# Patient Record
Sex: Female | Born: 1950
Health system: Southern US, Community
[De-identification: ages and names within clinical notes are randomized; demographics above are authoritative.]

## PROBLEM LIST (undated history)

## (undated) DIAGNOSIS — I10 Essential (primary) hypertension: Secondary | ICD-10-CM

## (undated) DIAGNOSIS — J449 Chronic obstructive pulmonary disease, unspecified: Secondary | ICD-10-CM

## (undated) DIAGNOSIS — F988 Other specified behavioral and emotional disorders with onset usually occurring in childhood and adolescence: Secondary | ICD-10-CM

## (undated) DIAGNOSIS — E039 Hypothyroidism, unspecified: Secondary | ICD-10-CM

## (undated) HISTORY — DX: Chronic obstructive pulmonary disease, unspecified: J44.9

## (undated) HISTORY — PX: OTHER SURGICAL HISTORY: SHX169

## (undated) HISTORY — DX: Hypothyroidism, unspecified: E03.9

## (undated) HISTORY — PX: KNEE ARTHROSCOPY: SUR90

## (undated) HISTORY — PX: ABDOMINAL HYSTERECTOMY: SHX81

## (undated) HISTORY — DX: Essential (primary) hypertension: I10

## (undated) HISTORY — DX: Other specified behavioral and emotional disorders with onset usually occurring in childhood and adolescence: F98.8

---

## 1999-01-09 ENCOUNTER — Inpatient Hospital Stay (HOSPITAL_COMMUNITY)
Admission: EM | Admit: 1999-01-09 | Discharge: 1999-01-12 | Payer: Self-pay | Admitting: Physical Medicine and Rehabilitation

## 1999-01-17 ENCOUNTER — Encounter
Admission: RE | Admit: 1999-01-17 | Discharge: 1999-04-17 | Payer: Self-pay | Admitting: Physical Medicine and Rehabilitation

## 1999-04-18 ENCOUNTER — Encounter: Admission: RE | Admit: 1999-04-18 | Discharge: 1999-05-01 | Payer: Self-pay

## 2000-09-17 ENCOUNTER — Encounter: Admission: RE | Admit: 2000-09-17 | Discharge: 2000-12-16 | Payer: Self-pay | Admitting: Neurology

## 2002-07-14 ENCOUNTER — Encounter: Payer: Self-pay | Admitting: Family Medicine

## 2002-07-14 ENCOUNTER — Ambulatory Visit (HOSPITAL_COMMUNITY): Admission: RE | Admit: 2002-07-14 | Discharge: 2002-07-14 | Payer: Self-pay | Admitting: Family Medicine

## 2003-01-28 ENCOUNTER — Ambulatory Visit (HOSPITAL_COMMUNITY): Admission: RE | Admit: 2003-01-28 | Discharge: 2003-01-28 | Payer: Self-pay | Admitting: Family Medicine

## 2003-01-28 ENCOUNTER — Encounter: Payer: Self-pay | Admitting: Family Medicine

## 2003-04-12 ENCOUNTER — Ambulatory Visit (HOSPITAL_COMMUNITY): Admission: RE | Admit: 2003-04-12 | Discharge: 2003-04-12 | Payer: Self-pay | Admitting: Family Medicine

## 2003-04-12 ENCOUNTER — Encounter: Payer: Self-pay | Admitting: Family Medicine

## 2003-08-09 ENCOUNTER — Encounter: Payer: Self-pay | Admitting: Family Medicine

## 2003-08-09 ENCOUNTER — Ambulatory Visit (HOSPITAL_COMMUNITY): Admission: RE | Admit: 2003-08-09 | Discharge: 2003-08-09 | Payer: Self-pay | Admitting: Family Medicine

## 2004-11-07 ENCOUNTER — Ambulatory Visit (HOSPITAL_COMMUNITY): Admission: RE | Admit: 2004-11-07 | Discharge: 2004-11-07 | Payer: Self-pay | Admitting: Family Medicine

## 2005-10-23 ENCOUNTER — Ambulatory Visit: Payer: Self-pay | Admitting: Orthopedic Surgery

## 2006-08-27 ENCOUNTER — Ambulatory Visit (HOSPITAL_COMMUNITY): Admission: RE | Admit: 2006-08-27 | Discharge: 2006-08-27 | Payer: Self-pay | Admitting: Family Medicine

## 2006-09-29 ENCOUNTER — Ambulatory Visit (HOSPITAL_COMMUNITY): Admission: RE | Admit: 2006-09-29 | Discharge: 2006-09-29 | Payer: Self-pay | Admitting: Family Medicine

## 2007-10-12 ENCOUNTER — Ambulatory Visit (HOSPITAL_COMMUNITY): Admission: RE | Admit: 2007-10-12 | Discharge: 2007-10-12 | Payer: Self-pay | Admitting: Family Medicine

## 2007-11-04 ENCOUNTER — Ambulatory Visit (HOSPITAL_COMMUNITY): Admission: RE | Admit: 2007-11-04 | Discharge: 2007-11-04 | Payer: Self-pay | Admitting: Family Medicine

## 2008-11-08 ENCOUNTER — Ambulatory Visit (HOSPITAL_COMMUNITY): Admission: RE | Admit: 2008-11-08 | Discharge: 2008-11-08 | Payer: Self-pay | Admitting: Family Medicine

## 2009-11-10 ENCOUNTER — Ambulatory Visit (HOSPITAL_COMMUNITY): Admission: RE | Admit: 2009-11-10 | Discharge: 2009-11-10 | Payer: Self-pay | Admitting: Family Medicine

## 2010-06-05 ENCOUNTER — Ambulatory Visit (HOSPITAL_COMMUNITY): Admission: RE | Admit: 2010-06-05 | Discharge: 2010-06-05 | Payer: Self-pay | Admitting: Family Medicine

## 2010-08-01 ENCOUNTER — Ambulatory Visit (HOSPITAL_COMMUNITY): Admission: RE | Admit: 2010-08-01 | Discharge: 2010-08-01 | Payer: Self-pay | Admitting: Family Medicine

## 2010-12-18 ENCOUNTER — Ambulatory Visit (HOSPITAL_COMMUNITY)
Admission: RE | Admit: 2010-12-18 | Discharge: 2010-12-18 | Payer: Self-pay | Source: Home / Self Care | Attending: Family Medicine | Admitting: Family Medicine

## 2010-12-29 ENCOUNTER — Encounter: Payer: Self-pay | Admitting: Family Medicine

## 2010-12-30 ENCOUNTER — Encounter: Payer: Self-pay | Admitting: *Deleted

## 2011-02-18 ENCOUNTER — Emergency Department (HOSPITAL_COMMUNITY)
Admission: EM | Admit: 2011-02-18 | Discharge: 2011-02-18 | Disposition: A | Discharge status: Home / Self Care | Payer: Medicare HMO | Attending: Emergency Medicine | Admitting: Emergency Medicine

## 2011-02-18 DIAGNOSIS — L02419 Cutaneous abscess of limb, unspecified: Secondary | ICD-10-CM | POA: Insufficient documentation

## 2011-02-18 LAB — CBC
HCT: 41.9 % (ref 36.0–46.0)
Hemoglobin: 13.8 g/dL (ref 12.0–15.0)
MCH: 30.5 pg (ref 26.0–34.0)
MCHC: 32.9 g/dL (ref 30.0–36.0)
MCV: 92.5 fL (ref 78.0–100.0)
Platelets: 219 10*3/uL (ref 150–400)
RBC: 4.53 MIL/uL (ref 3.87–5.11)
RDW: 13.5 % (ref 11.5–15.5)
WBC: 9.6 10*3/uL (ref 4.0–10.5)

## 2011-02-18 LAB — BASIC METABOLIC PANEL
BUN: 8 mg/dL (ref 6–23)
CO2: 27 mEq/L (ref 19–32)
Calcium: 9.1 mg/dL (ref 8.4–10.5)
Chloride: 104 mEq/L (ref 96–112)
Creatinine, Ser: 0.72 mg/dL (ref 0.4–1.2)
GFR calc Af Amer: >60 mL/min (ref >60)
GFR calc non Af Amer: >60 mL/min (ref >60)
Glucose, Bld: 97 mg/dL (ref 70–99)
Potassium: 4.2 mEq/L (ref 3.5–5.1)
Sodium: 139 mEq/L (ref 135–145)

## 2011-02-18 LAB — DIFFERENTIAL
Basophils Absolute: 0.1 10*3/uL (ref 0.0–0.1)
Basophils Relative: 1 % (ref 0–1)
Eosinophils Absolute: 0.3 10*3/uL (ref 0.0–0.7)
Eosinophils Relative: 3 % (ref 0–5)
Lymphocytes Relative: 24 % (ref 12–46)
Lymphs Abs: 2.3 10*3/uL (ref 0.7–4.0)
Monocytes Absolute: 0.6 10*3/uL (ref 0.1–1.0)
Monocytes Relative: 6 % (ref 3–12)
Neutro Abs: 6.3 10*3/uL (ref 1.7–7.7)
Neutrophils Relative %: 66 % (ref 43–77)

## 2011-12-23 ENCOUNTER — Other Ambulatory Visit (HOSPITAL_COMMUNITY): Payer: Self-pay | Admitting: Family Medicine

## 2011-12-23 DIAGNOSIS — Z139 Encounter for screening, unspecified: Secondary | ICD-10-CM

## 2011-12-31 ENCOUNTER — Ambulatory Visit (HOSPITAL_COMMUNITY)
Admission: RE | Admit: 2011-12-31 | Discharge: 2011-12-31 | Disposition: A | Discharge status: Home / Self Care | Payer: Medicare HMO | Source: Ambulatory Visit | Attending: Family Medicine | Admitting: Family Medicine

## 2011-12-31 DIAGNOSIS — Z139 Encounter for screening, unspecified: Secondary | ICD-10-CM

## 2011-12-31 DIAGNOSIS — Z1231 Encounter for screening mammogram for malignant neoplasm of breast: Secondary | ICD-10-CM | POA: Insufficient documentation

## 2013-01-25 ENCOUNTER — Other Ambulatory Visit (HOSPITAL_COMMUNITY): Payer: Self-pay | Admitting: Family Medicine

## 2013-01-25 DIAGNOSIS — Z139 Encounter for screening, unspecified: Secondary | ICD-10-CM

## 2013-02-11 ENCOUNTER — Ambulatory Visit (HOSPITAL_COMMUNITY): Payer: Medicare HMO

## 2013-02-12 ENCOUNTER — Ambulatory Visit (HOSPITAL_COMMUNITY): Payer: Medicare HMO

## 2013-05-06 ENCOUNTER — Ambulatory Visit (HOSPITAL_COMMUNITY): Payer: Medicare HMO

## 2013-10-11 ENCOUNTER — Ambulatory Visit (HOSPITAL_COMMUNITY)
Admission: RE | Admit: 2013-10-11 | Discharge: 2013-10-11 | Disposition: A | Discharge status: Home / Self Care | Payer: Medicare Other | Source: Ambulatory Visit | Attending: Family Medicine | Admitting: Family Medicine

## 2013-10-11 DIAGNOSIS — Z1231 Encounter for screening mammogram for malignant neoplasm of breast: Secondary | ICD-10-CM | POA: Insufficient documentation

## 2013-10-11 DIAGNOSIS — Z139 Encounter for screening, unspecified: Secondary | ICD-10-CM

## 2015-05-18 DIAGNOSIS — R0602 Shortness of breath: Secondary | ICD-10-CM | POA: Insufficient documentation

## 2016-08-28 ENCOUNTER — Other Ambulatory Visit (HOSPITAL_COMMUNITY): Payer: Self-pay | Admitting: Family Medicine

## 2016-08-28 ENCOUNTER — Ambulatory Visit (HOSPITAL_COMMUNITY)
Admission: RE | Admit: 2016-08-28 | Discharge: 2016-08-28 | Disposition: A | Discharge status: Home / Self Care | Payer: PPO | Source: Ambulatory Visit | Attending: Family Medicine | Admitting: Family Medicine

## 2016-08-28 DIAGNOSIS — M25532 Pain in left wrist: Secondary | ICD-10-CM | POA: Diagnosis not present

## 2016-08-28 DIAGNOSIS — M25461 Effusion, right knee: Secondary | ICD-10-CM | POA: Diagnosis not present

## 2016-08-28 DIAGNOSIS — Z9889 Other specified postprocedural states: Secondary | ICD-10-CM | POA: Diagnosis not present

## 2016-08-28 DIAGNOSIS — M1711 Unilateral primary osteoarthritis, right knee: Secondary | ICD-10-CM | POA: Diagnosis not present

## 2016-08-28 DIAGNOSIS — M899 Disorder of bone, unspecified: Secondary | ICD-10-CM | POA: Insufficient documentation

## 2016-08-28 DIAGNOSIS — M25561 Pain in right knee: Secondary | ICD-10-CM

## 2016-12-13 DIAGNOSIS — J069 Acute upper respiratory infection, unspecified: Secondary | ICD-10-CM | POA: Diagnosis not present

## 2016-12-13 DIAGNOSIS — Z1389 Encounter for screening for other disorder: Secondary | ICD-10-CM | POA: Diagnosis not present

## 2016-12-13 DIAGNOSIS — J209 Acute bronchitis, unspecified: Secondary | ICD-10-CM | POA: Diagnosis not present

## 2016-12-13 DIAGNOSIS — F909 Attention-deficit hyperactivity disorder, unspecified type: Secondary | ICD-10-CM | POA: Diagnosis not present

## 2016-12-13 DIAGNOSIS — Z6841 Body Mass Index (BMI) 40.0 and over, adult: Secondary | ICD-10-CM | POA: Diagnosis not present

## 2017-02-11 DIAGNOSIS — Z1231 Encounter for screening mammogram for malignant neoplasm of breast: Secondary | ICD-10-CM | POA: Diagnosis not present

## 2017-03-03 DIAGNOSIS — L600 Ingrowing nail: Secondary | ICD-10-CM | POA: Diagnosis not present

## 2017-03-03 DIAGNOSIS — M79671 Pain in right foot: Secondary | ICD-10-CM | POA: Diagnosis not present

## 2017-03-03 DIAGNOSIS — M25579 Pain in unspecified ankle and joints of unspecified foot: Secondary | ICD-10-CM | POA: Diagnosis not present

## 2017-03-03 DIAGNOSIS — I739 Peripheral vascular disease, unspecified: Secondary | ICD-10-CM | POA: Diagnosis not present

## 2017-03-04 ENCOUNTER — Ambulatory Visit (INDEPENDENT_AMBULATORY_CARE_PROVIDER_SITE_OTHER): Payer: Medicare Other | Admitting: Urology

## 2017-03-04 DIAGNOSIS — R392 Extrarenal uremia: Secondary | ICD-10-CM | POA: Diagnosis not present

## 2017-03-04 DIAGNOSIS — N3941 Urge incontinence: Secondary | ICD-10-CM | POA: Diagnosis not present

## 2017-04-15 ENCOUNTER — Ambulatory Visit: Payer: Medicare Other | Admitting: Urology

## 2017-05-20 DIAGNOSIS — F909 Attention-deficit hyperactivity disorder, unspecified type: Secondary | ICD-10-CM | POA: Diagnosis not present

## 2017-05-20 DIAGNOSIS — Z6841 Body Mass Index (BMI) 40.0 and over, adult: Secondary | ICD-10-CM | POA: Diagnosis not present

## 2017-05-26 DIAGNOSIS — I739 Peripheral vascular disease, unspecified: Secondary | ICD-10-CM | POA: Diagnosis not present

## 2017-05-28 DIAGNOSIS — Z1211 Encounter for screening for malignant neoplasm of colon: Secondary | ICD-10-CM | POA: Diagnosis not present

## 2017-07-22 DIAGNOSIS — H401131 Primary open-angle glaucoma, bilateral, mild stage: Secondary | ICD-10-CM | POA: Diagnosis not present

## 2017-08-18 DIAGNOSIS — I739 Peripheral vascular disease, unspecified: Secondary | ICD-10-CM | POA: Diagnosis not present

## 2017-09-15 DIAGNOSIS — B49 Unspecified mycosis: Secondary | ICD-10-CM | POA: Diagnosis not present

## 2017-09-15 DIAGNOSIS — Z23 Encounter for immunization: Secondary | ICD-10-CM | POA: Diagnosis not present

## 2017-09-15 DIAGNOSIS — Z1389 Encounter for screening for other disorder: Secondary | ICD-10-CM | POA: Diagnosis not present

## 2017-09-15 DIAGNOSIS — F909 Attention-deficit hyperactivity disorder, unspecified type: Secondary | ICD-10-CM | POA: Diagnosis not present

## 2017-09-15 DIAGNOSIS — Z6841 Body Mass Index (BMI) 40.0 and over, adult: Secondary | ICD-10-CM | POA: Diagnosis not present

## 2017-11-04 DIAGNOSIS — I739 Peripheral vascular disease, unspecified: Secondary | ICD-10-CM | POA: Diagnosis not present

## 2017-12-08 DIAGNOSIS — Z6841 Body Mass Index (BMI) 40.0 and over, adult: Secondary | ICD-10-CM | POA: Diagnosis not present

## 2017-12-08 DIAGNOSIS — R350 Frequency of micturition: Secondary | ICD-10-CM | POA: Diagnosis not present

## 2017-12-08 DIAGNOSIS — Z1389 Encounter for screening for other disorder: Secondary | ICD-10-CM | POA: Diagnosis not present

## 2017-12-08 DIAGNOSIS — J019 Acute sinusitis, unspecified: Secondary | ICD-10-CM | POA: Diagnosis not present

## 2017-12-08 DIAGNOSIS — R509 Fever, unspecified: Secondary | ICD-10-CM | POA: Diagnosis not present

## 2017-12-08 DIAGNOSIS — M545 Low back pain: Secondary | ICD-10-CM | POA: Diagnosis not present

## 2017-12-08 DIAGNOSIS — R05 Cough: Secondary | ICD-10-CM | POA: Diagnosis not present

## 2017-12-15 ENCOUNTER — Ambulatory Visit (HOSPITAL_COMMUNITY)
Admission: RE | Admit: 2017-12-15 | Discharge: 2017-12-15 | Disposition: A | Discharge status: Home / Self Care | Payer: Medicare Other | Source: Ambulatory Visit | Attending: Family Medicine | Admitting: Family Medicine

## 2017-12-15 ENCOUNTER — Other Ambulatory Visit (HOSPITAL_COMMUNITY): Payer: Self-pay | Admitting: Family Medicine

## 2017-12-15 DIAGNOSIS — J449 Chronic obstructive pulmonary disease, unspecified: Secondary | ICD-10-CM | POA: Diagnosis not present

## 2017-12-15 DIAGNOSIS — E039 Hypothyroidism, unspecified: Secondary | ICD-10-CM | POA: Diagnosis not present

## 2017-12-15 DIAGNOSIS — G40909 Epilepsy, unspecified, not intractable, without status epilepticus: Secondary | ICD-10-CM | POA: Diagnosis not present

## 2017-12-15 DIAGNOSIS — R0989 Other specified symptoms and signs involving the circulatory and respiratory systems: Secondary | ICD-10-CM | POA: Diagnosis not present

## 2017-12-15 DIAGNOSIS — F909 Attention-deficit hyperactivity disorder, unspecified type: Secondary | ICD-10-CM | POA: Diagnosis not present

## 2017-12-15 DIAGNOSIS — R0602 Shortness of breath: Secondary | ICD-10-CM | POA: Diagnosis not present

## 2017-12-15 DIAGNOSIS — E782 Mixed hyperlipidemia: Secondary | ICD-10-CM | POA: Diagnosis not present

## 2017-12-15 DIAGNOSIS — I517 Cardiomegaly: Secondary | ICD-10-CM | POA: Insufficient documentation

## 2017-12-15 DIAGNOSIS — J069 Acute upper respiratory infection, unspecified: Secondary | ICD-10-CM | POA: Insufficient documentation

## 2017-12-15 DIAGNOSIS — Z6841 Body Mass Index (BMI) 40.0 and over, adult: Secondary | ICD-10-CM | POA: Diagnosis not present

## 2017-12-15 DIAGNOSIS — R7309 Other abnormal glucose: Secondary | ICD-10-CM | POA: Diagnosis not present

## 2017-12-15 DIAGNOSIS — R05 Cough: Secondary | ICD-10-CM | POA: Diagnosis not present

## 2017-12-15 DIAGNOSIS — E559 Vitamin D deficiency, unspecified: Secondary | ICD-10-CM | POA: Diagnosis not present

## 2017-12-30 DIAGNOSIS — M1990 Unspecified osteoarthritis, unspecified site: Secondary | ICD-10-CM | POA: Diagnosis not present

## 2017-12-30 DIAGNOSIS — R0602 Shortness of breath: Secondary | ICD-10-CM | POA: Diagnosis not present

## 2017-12-30 DIAGNOSIS — Z87891 Personal history of nicotine dependence: Secondary | ICD-10-CM | POA: Diagnosis not present

## 2017-12-30 DIAGNOSIS — J189 Pneumonia, unspecified organism: Secondary | ICD-10-CM | POA: Diagnosis not present

## 2017-12-30 DIAGNOSIS — E8770 Fluid overload, unspecified: Secondary | ICD-10-CM | POA: Diagnosis not present

## 2017-12-30 DIAGNOSIS — J440 Chronic obstructive pulmonary disease with acute lower respiratory infection: Secondary | ICD-10-CM | POA: Diagnosis present

## 2017-12-30 DIAGNOSIS — E876 Hypokalemia: Secondary | ICD-10-CM | POA: Diagnosis present

## 2017-12-30 DIAGNOSIS — Z6841 Body Mass Index (BMI) 40.0 and over, adult: Secondary | ICD-10-CM | POA: Diagnosis not present

## 2017-12-30 DIAGNOSIS — R918 Other nonspecific abnormal finding of lung field: Secondary | ICD-10-CM | POA: Diagnosis not present

## 2017-12-30 DIAGNOSIS — I471 Supraventricular tachycardia: Secondary | ICD-10-CM | POA: Diagnosis not present

## 2017-12-30 DIAGNOSIS — N390 Urinary tract infection, site not specified: Secondary | ICD-10-CM | POA: Diagnosis present

## 2017-12-30 DIAGNOSIS — M170 Bilateral primary osteoarthritis of knee: Secondary | ICD-10-CM | POA: Diagnosis present

## 2017-12-30 DIAGNOSIS — R091 Pleurisy: Secondary | ICD-10-CM | POA: Diagnosis not present

## 2017-12-30 DIAGNOSIS — A419 Sepsis, unspecified organism: Secondary | ICD-10-CM | POA: Diagnosis not present

## 2017-12-30 DIAGNOSIS — T503X5A Adverse effect of electrolytic, caloric and water-balance agents, initial encounter: Secondary | ICD-10-CM | POA: Diagnosis not present

## 2017-12-30 DIAGNOSIS — F988 Other specified behavioral and emotional disorders with onset usually occurring in childhood and adolescence: Secondary | ICD-10-CM | POA: Diagnosis present

## 2017-12-30 DIAGNOSIS — R000 Tachycardia, unspecified: Secondary | ICD-10-CM | POA: Diagnosis not present

## 2017-12-30 DIAGNOSIS — H409 Unspecified glaucoma: Secondary | ICD-10-CM | POA: Diagnosis not present

## 2017-12-30 DIAGNOSIS — E039 Hypothyroidism, unspecified: Secondary | ICD-10-CM | POA: Diagnosis present

## 2017-12-30 DIAGNOSIS — J168 Pneumonia due to other specified infectious organisms: Secondary | ICD-10-CM | POA: Diagnosis not present

## 2017-12-30 DIAGNOSIS — F900 Attention-deficit hyperactivity disorder, predominantly inattentive type: Secondary | ICD-10-CM | POA: Diagnosis not present

## 2017-12-30 DIAGNOSIS — E872 Acidosis: Secondary | ICD-10-CM | POA: Diagnosis present

## 2017-12-30 DIAGNOSIS — B954 Other streptococcus as the cause of diseases classified elsewhere: Secondary | ICD-10-CM | POA: Diagnosis present

## 2017-12-30 DIAGNOSIS — J9601 Acute respiratory failure with hypoxia: Secondary | ICD-10-CM | POA: Diagnosis present

## 2017-12-30 DIAGNOSIS — N3281 Overactive bladder: Secondary | ICD-10-CM | POA: Diagnosis not present

## 2017-12-30 DIAGNOSIS — I4892 Unspecified atrial flutter: Secondary | ICD-10-CM | POA: Diagnosis present

## 2017-12-30 DIAGNOSIS — J441 Chronic obstructive pulmonary disease with (acute) exacerbation: Secondary | ICD-10-CM | POA: Diagnosis not present

## 2017-12-30 DIAGNOSIS — J90 Pleural effusion, not elsewhere classified: Secondary | ICD-10-CM | POA: Diagnosis not present

## 2017-12-30 DIAGNOSIS — J13 Pneumonia due to Streptococcus pneumoniae: Secondary | ICD-10-CM | POA: Diagnosis present

## 2017-12-30 DIAGNOSIS — R41841 Cognitive communication deficit: Secondary | ICD-10-CM | POA: Diagnosis not present

## 2017-12-30 DIAGNOSIS — E6609 Other obesity due to excess calories: Secondary | ICD-10-CM | POA: Diagnosis present

## 2017-12-30 DIAGNOSIS — A403 Sepsis due to Streptococcus pneumoniae: Secondary | ICD-10-CM | POA: Diagnosis not present

## 2017-12-30 DIAGNOSIS — Z79899 Other long term (current) drug therapy: Secondary | ICD-10-CM | POA: Diagnosis not present

## 2017-12-30 DIAGNOSIS — M6281 Muscle weakness (generalized): Secondary | ICD-10-CM | POA: Diagnosis not present

## 2017-12-30 DIAGNOSIS — I4891 Unspecified atrial fibrillation: Secondary | ICD-10-CM | POA: Diagnosis present

## 2018-01-05 ENCOUNTER — Encounter: Payer: Self-pay | Admitting: Cardiovascular Disease

## 2018-01-07 DIAGNOSIS — E876 Hypokalemia: Secondary | ICD-10-CM | POA: Diagnosis not present

## 2018-01-07 DIAGNOSIS — J168 Pneumonia due to other specified infectious organisms: Secondary | ICD-10-CM | POA: Diagnosis not present

## 2018-01-07 DIAGNOSIS — I4891 Unspecified atrial fibrillation: Secondary | ICD-10-CM | POA: Diagnosis not present

## 2018-01-07 DIAGNOSIS — J441 Chronic obstructive pulmonary disease with (acute) exacerbation: Secondary | ICD-10-CM | POA: Diagnosis not present

## 2018-01-07 DIAGNOSIS — I471 Supraventricular tachycardia: Secondary | ICD-10-CM | POA: Diagnosis not present

## 2018-01-07 DIAGNOSIS — J9601 Acute respiratory failure with hypoxia: Secondary | ICD-10-CM | POA: Diagnosis not present

## 2018-01-07 DIAGNOSIS — M1990 Unspecified osteoarthritis, unspecified site: Secondary | ICD-10-CM | POA: Diagnosis not present

## 2018-01-07 DIAGNOSIS — J440 Chronic obstructive pulmonary disease with acute lower respiratory infection: Secondary | ICD-10-CM | POA: Diagnosis not present

## 2018-01-07 DIAGNOSIS — F988 Other specified behavioral and emotional disorders with onset usually occurring in childhood and adolescence: Secondary | ICD-10-CM | POA: Diagnosis not present

## 2018-01-07 DIAGNOSIS — J189 Pneumonia, unspecified organism: Secondary | ICD-10-CM | POA: Diagnosis not present

## 2018-01-07 DIAGNOSIS — R41841 Cognitive communication deficit: Secondary | ICD-10-CM | POA: Diagnosis not present

## 2018-01-07 DIAGNOSIS — N3281 Overactive bladder: Secondary | ICD-10-CM | POA: Diagnosis not present

## 2018-01-07 DIAGNOSIS — H409 Unspecified glaucoma: Secondary | ICD-10-CM | POA: Diagnosis not present

## 2018-01-07 DIAGNOSIS — E039 Hypothyroidism, unspecified: Secondary | ICD-10-CM | POA: Diagnosis not present

## 2018-01-07 DIAGNOSIS — A419 Sepsis, unspecified organism: Secondary | ICD-10-CM | POA: Diagnosis not present

## 2018-01-07 DIAGNOSIS — M6281 Muscle weakness (generalized): Secondary | ICD-10-CM | POA: Diagnosis not present

## 2018-01-25 DIAGNOSIS — J449 Chronic obstructive pulmonary disease, unspecified: Secondary | ICD-10-CM | POA: Diagnosis not present

## 2018-01-25 DIAGNOSIS — I471 Supraventricular tachycardia: Secondary | ICD-10-CM | POA: Diagnosis not present

## 2018-01-25 DIAGNOSIS — Z8701 Personal history of pneumonia (recurrent): Secondary | ICD-10-CM | POA: Diagnosis not present

## 2018-01-25 DIAGNOSIS — Z87891 Personal history of nicotine dependence: Secondary | ICD-10-CM | POA: Diagnosis not present

## 2018-01-25 DIAGNOSIS — M1712 Unilateral primary osteoarthritis, left knee: Secondary | ICD-10-CM | POA: Diagnosis not present

## 2018-01-25 DIAGNOSIS — M1711 Unilateral primary osteoarthritis, right knee: Secondary | ICD-10-CM | POA: Diagnosis not present

## 2018-01-26 DIAGNOSIS — J449 Chronic obstructive pulmonary disease, unspecified: Secondary | ICD-10-CM | POA: Diagnosis not present

## 2018-01-26 DIAGNOSIS — Z8701 Personal history of pneumonia (recurrent): Secondary | ICD-10-CM | POA: Diagnosis not present

## 2018-01-26 DIAGNOSIS — Z87891 Personal history of nicotine dependence: Secondary | ICD-10-CM | POA: Diagnosis not present

## 2018-01-26 DIAGNOSIS — M1711 Unilateral primary osteoarthritis, right knee: Secondary | ICD-10-CM | POA: Diagnosis not present

## 2018-01-26 DIAGNOSIS — M1712 Unilateral primary osteoarthritis, left knee: Secondary | ICD-10-CM | POA: Diagnosis not present

## 2018-01-26 DIAGNOSIS — I471 Supraventricular tachycardia: Secondary | ICD-10-CM | POA: Diagnosis not present

## 2018-01-27 DIAGNOSIS — I739 Peripheral vascular disease, unspecified: Secondary | ICD-10-CM | POA: Diagnosis not present

## 2018-01-28 DIAGNOSIS — Z87891 Personal history of nicotine dependence: Secondary | ICD-10-CM | POA: Diagnosis not present

## 2018-01-28 DIAGNOSIS — M1711 Unilateral primary osteoarthritis, right knee: Secondary | ICD-10-CM | POA: Diagnosis not present

## 2018-01-28 DIAGNOSIS — I471 Supraventricular tachycardia: Secondary | ICD-10-CM | POA: Diagnosis not present

## 2018-01-28 DIAGNOSIS — Z8701 Personal history of pneumonia (recurrent): Secondary | ICD-10-CM | POA: Diagnosis not present

## 2018-01-28 DIAGNOSIS — J449 Chronic obstructive pulmonary disease, unspecified: Secondary | ICD-10-CM | POA: Diagnosis not present

## 2018-01-28 DIAGNOSIS — M1712 Unilateral primary osteoarthritis, left knee: Secondary | ICD-10-CM | POA: Diagnosis not present

## 2018-01-30 DIAGNOSIS — Z6841 Body Mass Index (BMI) 40.0 and over, adult: Secondary | ICD-10-CM | POA: Diagnosis not present

## 2018-01-30 DIAGNOSIS — A419 Sepsis, unspecified organism: Secondary | ICD-10-CM | POA: Diagnosis not present

## 2018-01-30 DIAGNOSIS — J189 Pneumonia, unspecified organism: Secondary | ICD-10-CM | POA: Diagnosis not present

## 2018-02-02 DIAGNOSIS — Z87891 Personal history of nicotine dependence: Secondary | ICD-10-CM | POA: Diagnosis not present

## 2018-02-02 DIAGNOSIS — I471 Supraventricular tachycardia: Secondary | ICD-10-CM | POA: Diagnosis not present

## 2018-02-02 DIAGNOSIS — Z8701 Personal history of pneumonia (recurrent): Secondary | ICD-10-CM | POA: Diagnosis not present

## 2018-02-02 DIAGNOSIS — M1711 Unilateral primary osteoarthritis, right knee: Secondary | ICD-10-CM | POA: Diagnosis not present

## 2018-02-02 DIAGNOSIS — M1712 Unilateral primary osteoarthritis, left knee: Secondary | ICD-10-CM | POA: Diagnosis not present

## 2018-02-02 DIAGNOSIS — J449 Chronic obstructive pulmonary disease, unspecified: Secondary | ICD-10-CM | POA: Diagnosis not present

## 2018-02-03 DIAGNOSIS — J449 Chronic obstructive pulmonary disease, unspecified: Secondary | ICD-10-CM | POA: Diagnosis not present

## 2018-02-03 DIAGNOSIS — M1712 Unilateral primary osteoarthritis, left knee: Secondary | ICD-10-CM | POA: Diagnosis not present

## 2018-02-03 DIAGNOSIS — M1711 Unilateral primary osteoarthritis, right knee: Secondary | ICD-10-CM | POA: Diagnosis not present

## 2018-02-03 DIAGNOSIS — Z8701 Personal history of pneumonia (recurrent): Secondary | ICD-10-CM | POA: Diagnosis not present

## 2018-02-03 DIAGNOSIS — I471 Supraventricular tachycardia: Secondary | ICD-10-CM | POA: Diagnosis not present

## 2018-02-03 DIAGNOSIS — Z87891 Personal history of nicotine dependence: Secondary | ICD-10-CM | POA: Diagnosis not present

## 2018-02-04 DIAGNOSIS — I471 Supraventricular tachycardia: Secondary | ICD-10-CM | POA: Diagnosis not present

## 2018-02-04 DIAGNOSIS — J449 Chronic obstructive pulmonary disease, unspecified: Secondary | ICD-10-CM | POA: Diagnosis not present

## 2018-02-04 DIAGNOSIS — M1711 Unilateral primary osteoarthritis, right knee: Secondary | ICD-10-CM | POA: Diagnosis not present

## 2018-02-04 DIAGNOSIS — Z8701 Personal history of pneumonia (recurrent): Secondary | ICD-10-CM | POA: Diagnosis not present

## 2018-02-04 DIAGNOSIS — M1712 Unilateral primary osteoarthritis, left knee: Secondary | ICD-10-CM | POA: Diagnosis not present

## 2018-02-04 DIAGNOSIS — Z87891 Personal history of nicotine dependence: Secondary | ICD-10-CM | POA: Diagnosis not present

## 2018-02-06 DIAGNOSIS — M1712 Unilateral primary osteoarthritis, left knee: Secondary | ICD-10-CM | POA: Diagnosis not present

## 2018-02-06 DIAGNOSIS — J189 Pneumonia, unspecified organism: Secondary | ICD-10-CM | POA: Diagnosis not present

## 2018-02-06 DIAGNOSIS — I471 Supraventricular tachycardia: Secondary | ICD-10-CM | POA: Diagnosis not present

## 2018-02-06 DIAGNOSIS — Z8701 Personal history of pneumonia (recurrent): Secondary | ICD-10-CM | POA: Diagnosis not present

## 2018-02-06 DIAGNOSIS — Z87891 Personal history of nicotine dependence: Secondary | ICD-10-CM | POA: Diagnosis not present

## 2018-02-06 DIAGNOSIS — J449 Chronic obstructive pulmonary disease, unspecified: Secondary | ICD-10-CM | POA: Diagnosis not present

## 2018-02-06 DIAGNOSIS — A419 Sepsis, unspecified organism: Secondary | ICD-10-CM | POA: Diagnosis not present

## 2018-02-06 DIAGNOSIS — M1711 Unilateral primary osteoarthritis, right knee: Secondary | ICD-10-CM | POA: Diagnosis not present

## 2018-02-06 DIAGNOSIS — Z6841 Body Mass Index (BMI) 40.0 and over, adult: Secondary | ICD-10-CM | POA: Diagnosis not present

## 2018-02-09 DIAGNOSIS — M1711 Unilateral primary osteoarthritis, right knee: Secondary | ICD-10-CM | POA: Diagnosis not present

## 2018-02-09 DIAGNOSIS — I471 Supraventricular tachycardia: Secondary | ICD-10-CM | POA: Diagnosis not present

## 2018-02-09 DIAGNOSIS — Z87891 Personal history of nicotine dependence: Secondary | ICD-10-CM | POA: Diagnosis not present

## 2018-02-09 DIAGNOSIS — M1712 Unilateral primary osteoarthritis, left knee: Secondary | ICD-10-CM | POA: Diagnosis not present

## 2018-02-09 DIAGNOSIS — Z8701 Personal history of pneumonia (recurrent): Secondary | ICD-10-CM | POA: Diagnosis not present

## 2018-02-09 DIAGNOSIS — J449 Chronic obstructive pulmonary disease, unspecified: Secondary | ICD-10-CM | POA: Diagnosis not present

## 2018-02-10 DIAGNOSIS — M1712 Unilateral primary osteoarthritis, left knee: Secondary | ICD-10-CM | POA: Diagnosis not present

## 2018-02-10 DIAGNOSIS — I471 Supraventricular tachycardia: Secondary | ICD-10-CM | POA: Diagnosis not present

## 2018-02-10 DIAGNOSIS — M1711 Unilateral primary osteoarthritis, right knee: Secondary | ICD-10-CM | POA: Diagnosis not present

## 2018-02-10 DIAGNOSIS — Z8701 Personal history of pneumonia (recurrent): Secondary | ICD-10-CM | POA: Diagnosis not present

## 2018-02-10 DIAGNOSIS — J449 Chronic obstructive pulmonary disease, unspecified: Secondary | ICD-10-CM | POA: Diagnosis not present

## 2018-02-10 DIAGNOSIS — Z87891 Personal history of nicotine dependence: Secondary | ICD-10-CM | POA: Diagnosis not present

## 2018-02-11 DIAGNOSIS — R0600 Dyspnea, unspecified: Secondary | ICD-10-CM | POA: Diagnosis not present

## 2018-02-11 DIAGNOSIS — Z6841 Body Mass Index (BMI) 40.0 and over, adult: Secondary | ICD-10-CM | POA: Diagnosis not present

## 2018-02-11 DIAGNOSIS — I4892 Unspecified atrial flutter: Secondary | ICD-10-CM | POA: Diagnosis not present

## 2018-02-11 DIAGNOSIS — I10 Essential (primary) hypertension: Secondary | ICD-10-CM | POA: Diagnosis not present

## 2018-02-12 ENCOUNTER — Encounter: Payer: Self-pay | Admitting: Cardiovascular Disease

## 2018-02-12 DIAGNOSIS — Z823 Family history of stroke: Secondary | ICD-10-CM | POA: Diagnosis not present

## 2018-02-12 DIAGNOSIS — F909 Attention-deficit hyperactivity disorder, unspecified type: Secondary | ICD-10-CM | POA: Diagnosis not present

## 2018-02-12 DIAGNOSIS — Z8249 Family history of ischemic heart disease and other diseases of the circulatory system: Secondary | ICD-10-CM | POA: Diagnosis not present

## 2018-02-12 DIAGNOSIS — I4891 Unspecified atrial fibrillation: Secondary | ICD-10-CM | POA: Diagnosis not present

## 2018-02-12 DIAGNOSIS — M170 Bilateral primary osteoarthritis of knee: Secondary | ICD-10-CM | POA: Diagnosis not present

## 2018-02-12 DIAGNOSIS — Z87891 Personal history of nicotine dependence: Secondary | ICD-10-CM | POA: Diagnosis not present

## 2018-02-12 DIAGNOSIS — E039 Hypothyroidism, unspecified: Secondary | ICD-10-CM | POA: Diagnosis not present

## 2018-02-12 DIAGNOSIS — Z811 Family history of alcohol abuse and dependence: Secondary | ICD-10-CM | POA: Diagnosis not present

## 2018-02-12 DIAGNOSIS — J449 Chronic obstructive pulmonary disease, unspecified: Secondary | ICD-10-CM | POA: Diagnosis not present

## 2018-02-12 DIAGNOSIS — M1712 Unilateral primary osteoarthritis, left knee: Secondary | ICD-10-CM | POA: Diagnosis not present

## 2018-02-12 DIAGNOSIS — Z7982 Long term (current) use of aspirin: Secondary | ICD-10-CM | POA: Diagnosis not present

## 2018-02-12 DIAGNOSIS — Z8701 Personal history of pneumonia (recurrent): Secondary | ICD-10-CM | POA: Diagnosis not present

## 2018-02-12 DIAGNOSIS — I471 Supraventricular tachycardia: Secondary | ICD-10-CM | POA: Diagnosis not present

## 2018-02-12 DIAGNOSIS — Z809 Family history of malignant neoplasm, unspecified: Secondary | ICD-10-CM | POA: Diagnosis not present

## 2018-02-12 DIAGNOSIS — R0602 Shortness of breath: Secondary | ICD-10-CM | POA: Diagnosis not present

## 2018-02-12 DIAGNOSIS — M1711 Unilateral primary osteoarthritis, right knee: Secondary | ICD-10-CM | POA: Diagnosis not present

## 2018-02-12 DIAGNOSIS — Z90710 Acquired absence of both cervix and uterus: Secondary | ICD-10-CM | POA: Diagnosis not present

## 2018-02-12 DIAGNOSIS — Z79899 Other long term (current) drug therapy: Secondary | ICD-10-CM | POA: Diagnosis not present

## 2018-02-12 DIAGNOSIS — R000 Tachycardia, unspecified: Secondary | ICD-10-CM | POA: Diagnosis not present

## 2018-02-12 DIAGNOSIS — I4892 Unspecified atrial flutter: Secondary | ICD-10-CM | POA: Diagnosis not present

## 2018-02-12 DIAGNOSIS — R002 Palpitations: Secondary | ICD-10-CM | POA: Diagnosis not present

## 2018-02-12 DIAGNOSIS — Z833 Family history of diabetes mellitus: Secondary | ICD-10-CM | POA: Diagnosis not present

## 2018-02-13 DIAGNOSIS — I4892 Unspecified atrial flutter: Secondary | ICD-10-CM | POA: Diagnosis not present

## 2018-02-14 DIAGNOSIS — Z87891 Personal history of nicotine dependence: Secondary | ICD-10-CM | POA: Diagnosis not present

## 2018-02-14 DIAGNOSIS — M1712 Unilateral primary osteoarthritis, left knee: Secondary | ICD-10-CM | POA: Diagnosis not present

## 2018-02-14 DIAGNOSIS — J449 Chronic obstructive pulmonary disease, unspecified: Secondary | ICD-10-CM | POA: Diagnosis not present

## 2018-02-14 DIAGNOSIS — Z8701 Personal history of pneumonia (recurrent): Secondary | ICD-10-CM | POA: Diagnosis not present

## 2018-02-14 DIAGNOSIS — I471 Supraventricular tachycardia: Secondary | ICD-10-CM | POA: Diagnosis not present

## 2018-02-14 DIAGNOSIS — M1711 Unilateral primary osteoarthritis, right knee: Secondary | ICD-10-CM | POA: Diagnosis not present

## 2018-02-16 ENCOUNTER — Encounter: Payer: Self-pay | Admitting: *Deleted

## 2018-02-16 DIAGNOSIS — I471 Supraventricular tachycardia: Secondary | ICD-10-CM | POA: Diagnosis not present

## 2018-02-16 DIAGNOSIS — Z87891 Personal history of nicotine dependence: Secondary | ICD-10-CM | POA: Diagnosis not present

## 2018-02-16 DIAGNOSIS — M1712 Unilateral primary osteoarthritis, left knee: Secondary | ICD-10-CM | POA: Diagnosis not present

## 2018-02-16 DIAGNOSIS — Z8701 Personal history of pneumonia (recurrent): Secondary | ICD-10-CM | POA: Diagnosis not present

## 2018-02-16 DIAGNOSIS — M1711 Unilateral primary osteoarthritis, right knee: Secondary | ICD-10-CM | POA: Diagnosis not present

## 2018-02-16 DIAGNOSIS — J449 Chronic obstructive pulmonary disease, unspecified: Secondary | ICD-10-CM | POA: Diagnosis not present

## 2018-02-17 ENCOUNTER — Encounter: Payer: Self-pay | Admitting: Cardiovascular Disease

## 2018-02-17 ENCOUNTER — Ambulatory Visit (INDEPENDENT_AMBULATORY_CARE_PROVIDER_SITE_OTHER): Payer: Medicare Other | Admitting: Cardiovascular Disease

## 2018-02-17 ENCOUNTER — Encounter: Payer: Self-pay | Admitting: *Deleted

## 2018-02-17 VITALS — BP 122/84 | HR 66 | Ht 67.0 in | Wt 272.0 lb

## 2018-02-17 DIAGNOSIS — Z7189 Other specified counseling: Secondary | ICD-10-CM | POA: Diagnosis not present

## 2018-02-17 DIAGNOSIS — I10 Essential (primary) hypertension: Secondary | ICD-10-CM

## 2018-02-17 DIAGNOSIS — J449 Chronic obstructive pulmonary disease, unspecified: Secondary | ICD-10-CM

## 2018-02-17 DIAGNOSIS — E039 Hypothyroidism, unspecified: Secondary | ICD-10-CM

## 2018-02-17 DIAGNOSIS — I4892 Unspecified atrial flutter: Secondary | ICD-10-CM | POA: Diagnosis not present

## 2018-02-17 MED ORDER — APIXABAN 5 MG PO TABS: 5.0000 mg | ORAL_TABLET | Freq: Two times a day (BID) | ORAL | 6 refills | Status: DC

## 2018-02-17 MED ORDER — APIXABAN 5 MG PO TABS: 5.0000 mg | ORAL_TABLET | Freq: Two times a day (BID) | ORAL | 0 refills | Status: DC

## 2018-02-17 NOTE — Patient Instructions (Signed)
Medication Instructions:   Stop Aspirin.  Begin Eliquis 5mg  twice a day.    Continue all other medications.    Labwork: none  Testing/Procedures: none  Follow-Up: 3 months   Any Other Special Instructions Will Be Listed Below (If Applicable). Enroll in anticoagulation clinic for new management of Eliquis - 1 month.    If you need a refill on your cardiac medications before your next appointment, please call your pharmacy.

## 2018-02-17 NOTE — Progress Notes (Signed)
CARDIOLOGY CONSULT NOTE  Patient ID: Autumn Parrish MRN: [id number] DOB/AGE: 67-Nov-1952 67 y.o.  Admit date: (Not on file) Primary Physician: Sharilyn Sites, MD Referring Physician: Dr. Rowe Pavy  Reason for Consultation: Rapid atrial flutter  HPI: Autumn Parrish is a 67 y.o. female who is being seen today for the evaluation of rapid atrial flutter at the request of Sherril Cong, MD.   The patient was recently hospitalized at [facility].  I reviewed extensive documentation, labs, and studies.  She reportedly has a history of both rapid atrial fibrillation and rapid atrial flutter.  An echocardiogram performed in January 2019 reportedly demonstrated normal left ventricular systolic function, LVEF 71%.  She was discharged on long-acting diltiazem 240 mg daily along with metoprolol 25 mg twice daily.    Relevant labs performed on 02/12/18: BUN 25, N-terminal proBNP 225, calcium 9.1, creatinine 0.85, hemoglobin 14.7, TSH 3.36, normal troponin, white blood cells 8.8, urine tox screen positive for amphetamines.  Additional past medical history includes COPD, hypothyroidism, and a history of tobacco abuse.  Chest x-ray showed stable cardiomegaly with minimal aortic atherosclerosis and slight interval increase in vascular congestion on 02/12/18.  I personally reviewed an ECG performed on 02/12/18 at 1724 which demonstrated rapid atrial flutter, heart rate 121 bpm.  There appeared to be variable conduction.  She denies a history of MI.  I will have to request a copy of the echocardiogram report.  She told me she had pneumonia back in January.  She currently denies chest pain, palpitations, and shortness of breath.  She quit smoking 5 years ago.  She said she has chronic lower extremity edema.  She walks with a cane and has bilateral knee arthritis and previously underwent surgery on her right knee about 35 years ago.   Allergies  Allergen Reactions  . Nsaids     Upset stomach can tolerate  ibuprofen     Current Outpatient Medications  Medication Sig Dispense Refill  . amphetamine-dextroamphetamine (ADDERALL) 30 MG tablet Take 30 mg by mouth 2 (two) times daily.    Marland Kitchen aspirin EC 81 MG tablet Take 81 mg by mouth daily.    Marland Kitchen diltiazem (CARDIZEM CD) 240 MG 24 hr capsule Take 240 mg by mouth daily.    . ergocalciferol (VITAMIN D2) 50000 units capsule Take 50,000 Units by mouth once a week.    Marland Kitchen ibuprofen (ADVIL,MOTRIN) 800 MG tablet Take 800 mg by mouth every 8 (eight) hours as needed.    Marland Kitchen levothyroxine (SYNTHROID, LEVOTHROID) 50 MCG tablet Take 50 mcg by mouth daily before breakfast.    . metoprolol tartrate (LOPRESSOR) 25 MG tablet Take 25 mg by mouth 2 (two) times daily.    . Multiple Vitamins-Minerals (CENTRUM ADULTS PO) Take by mouth.    . oxybutynin (DITROPAN XL) 15 MG 24 hr tablet Take 15 mg by mouth at bedtime.    . timolol (BETIMOL) 0.5 % ophthalmic solution Place 1 drop into both eyes 2 (two) times daily.     No current facility-administered medications for this visit.     History reviewed. No pertinent past medical history.  History reviewed. No pertinent surgical history.  Social History   Socioeconomic History  . Marital status: Divorced    Spouse name: Not on file  . Number of children: Not on file  . Years of education: Not on file  . Highest education level: Not on file  Social Needs  . Financial resource strain: Not on file  .  Food insecurity - worry: Not on file  . Food insecurity - inability: Not on file  . Transportation needs - medical: Not on file  . Transportation needs - non-medical: Not on file  Occupational History  . Not on file  Tobacco Use  . Smoking status: Former Smoker    Types: Cigarettes    Last attempt to quit: 02/17/2013    Years since quitting: 5.0  . Smokeless tobacco: Never Used  Substance and Sexual Activity  . Alcohol use: Not on file  . Drug use: Not on file  . Sexual activity: Not on file  Other Topics Concern  .  Not on file  Social History Narrative  . Not on file     No family history of premature CAD in 1st degree relatives.  Current Meds  Medication Sig  . amphetamine-dextroamphetamine (ADDERALL) 30 MG tablet Take 30 mg by mouth 2 (two) times daily.  Marland Kitchen aspirin EC 81 MG tablet Take 81 mg by mouth daily.  Marland Kitchen diltiazem (CARDIZEM CD) 240 MG 24 hr capsule Take 240 mg by mouth daily.  . ergocalciferol (VITAMIN D2) 50000 units capsule Take 50,000 Units by mouth once a week.  Marland Kitchen ibuprofen (ADVIL,MOTRIN) 800 MG tablet Take 800 mg by mouth every 8 (eight) hours as needed.  Marland Kitchen levothyroxine (SYNTHROID, LEVOTHROID) 50 MCG tablet Take 50 mcg by mouth daily before breakfast.  . metoprolol tartrate (LOPRESSOR) 25 MG tablet Take 25 mg by mouth 2 (two) times daily.  . Multiple Vitamins-Minerals (CENTRUM ADULTS PO) Take by mouth.  . oxybutynin (DITROPAN XL) 15 MG 24 hr tablet Take 15 mg by mouth at bedtime.  . timolol (BETIMOL) 0.5 % ophthalmic solution Place 1 drop into both eyes 2 (two) times daily.      Review of systems complete and found to be negative unless listed above in HPI    Physical exam Blood pressure 122/84, pulse 66, height 5\' 7"  (1.702 m), weight 272 lb (123.4 kg), SpO2 98 %. General: NAD Neck: No JVD, no thyromegaly or thyroid nodule.  Lungs: Clear to auscultation bilaterally with normal respiratory effort. CV: Nondisplaced PMI. Regular rate and rhythm, normal S1/S2, no S3/S4, no murmur.  Trivial bilateral lower extremity edema.  No carotid bruit.    Abdomen: Soft, nontender, no distention.  Skin: Intact without lesions or rashes.  Neurologic: Alert and oriented x 3.  Psych: Normal affect. Extremities: No clubbing or cyanosis.  HEENT: Normal.   ECG: Most recent ECG reviewed.   Labs: Lab Results  Component Value Date/Time   K 4.2 02/18/2011 11:24 AM   BUN 8 02/18/2011 11:24 AM   CREATININE 0.72 02/18/2011 11:24 AM   HGB 13.8 02/18/2011 11:24 AM     Lipids: No results found  for: LDLCALC, LDLDIRECT, CHOL, TRIG, HDL      ASSESSMENT AND PLAN:   1.  Rapid atrial flutter: Symptomatically stable.  She is in a regular rhythm at present.  Heart rate is presently controlled on long-acting diltiazem 240 mg daily and metoprolol tartrate 25 mg twice daily.  She is only taking aspirin 81 mg daily. CHA2DS2VASC score is at least 3 (hypertension, age, gender).  Thus, systemic anticoagulation is indicated to reduce thrombolic risk.  I will stop aspirin and start Eliquis 5 mg twice daily.  I gave her a detailed explanation of the rationale for this and she is in agreement.  2.  Hypertension: Controlled on present therapy.  No changes.   3.  COPD: Stable.  She quit smoking  5 years ago.  4.  Hypothyroidism: TSH is normal as reviewed above.  She is on levothyroxine 50 mcg daily.    Disposition: Follow up in 3 months  Signed: Kate Sable, M.D., F.A.C.C.  02/17/2018, 8:36 AM

## 2018-02-18 DIAGNOSIS — M1711 Unilateral primary osteoarthritis, right knee: Secondary | ICD-10-CM | POA: Diagnosis not present

## 2018-02-18 DIAGNOSIS — Z6841 Body Mass Index (BMI) 40.0 and over, adult: Secondary | ICD-10-CM | POA: Diagnosis not present

## 2018-02-18 DIAGNOSIS — J449 Chronic obstructive pulmonary disease, unspecified: Secondary | ICD-10-CM | POA: Diagnosis not present

## 2018-02-18 DIAGNOSIS — I471 Supraventricular tachycardia: Secondary | ICD-10-CM | POA: Diagnosis not present

## 2018-02-18 DIAGNOSIS — Z8701 Personal history of pneumonia (recurrent): Secondary | ICD-10-CM | POA: Diagnosis not present

## 2018-02-18 DIAGNOSIS — Z87891 Personal history of nicotine dependence: Secondary | ICD-10-CM | POA: Diagnosis not present

## 2018-02-18 DIAGNOSIS — Z1389 Encounter for screening for other disorder: Secondary | ICD-10-CM | POA: Diagnosis not present

## 2018-02-18 DIAGNOSIS — I4891 Unspecified atrial fibrillation: Secondary | ICD-10-CM | POA: Diagnosis not present

## 2018-02-18 DIAGNOSIS — M1712 Unilateral primary osteoarthritis, left knee: Secondary | ICD-10-CM | POA: Diagnosis not present

## 2018-02-19 ENCOUNTER — Telehealth: Payer: Self-pay

## 2018-02-19 DIAGNOSIS — Z87891 Personal history of nicotine dependence: Secondary | ICD-10-CM | POA: Diagnosis not present

## 2018-02-19 DIAGNOSIS — I4892 Unspecified atrial flutter: Secondary | ICD-10-CM

## 2018-02-19 DIAGNOSIS — M1712 Unilateral primary osteoarthritis, left knee: Secondary | ICD-10-CM | POA: Diagnosis not present

## 2018-02-19 DIAGNOSIS — J449 Chronic obstructive pulmonary disease, unspecified: Secondary | ICD-10-CM | POA: Diagnosis not present

## 2018-02-19 DIAGNOSIS — M1711 Unilateral primary osteoarthritis, right knee: Secondary | ICD-10-CM | POA: Diagnosis not present

## 2018-02-19 DIAGNOSIS — Z8701 Personal history of pneumonia (recurrent): Secondary | ICD-10-CM | POA: Diagnosis not present

## 2018-02-19 DIAGNOSIS — I471 Supraventricular tachycardia: Secondary | ICD-10-CM | POA: Diagnosis not present

## 2018-02-19 NOTE — Telephone Encounter (Signed)
I would have her see EP to see if she is an ablation candidate.

## 2018-02-19 NOTE — Telephone Encounter (Signed)
Left message for home health nurse Elmyra Ricks

## 2018-02-19 NOTE — Telephone Encounter (Signed)
Home health nurse contacted office stating she checked patients pulse (apical) and it is 150. Patient has recently started eliquis and restarted cartia. Patient is not have any symptoms associated with this and did not realize she was tachycardic. Patient states she will not go to the hospital. Will send to dr to advise.

## 2018-02-20 DIAGNOSIS — Z8701 Personal history of pneumonia (recurrent): Secondary | ICD-10-CM | POA: Diagnosis not present

## 2018-02-20 DIAGNOSIS — M1712 Unilateral primary osteoarthritis, left knee: Secondary | ICD-10-CM | POA: Diagnosis not present

## 2018-02-20 DIAGNOSIS — M1711 Unilateral primary osteoarthritis, right knee: Secondary | ICD-10-CM | POA: Diagnosis not present

## 2018-02-20 DIAGNOSIS — J449 Chronic obstructive pulmonary disease, unspecified: Secondary | ICD-10-CM | POA: Diagnosis not present

## 2018-02-20 DIAGNOSIS — Z87891 Personal history of nicotine dependence: Secondary | ICD-10-CM | POA: Diagnosis not present

## 2018-02-20 DIAGNOSIS — I471 Supraventricular tachycardia: Secondary | ICD-10-CM | POA: Diagnosis not present

## 2018-02-20 NOTE — Telephone Encounter (Signed)
Home health nurse notified. EP referral placed. Scheduling made aware

## 2018-02-23 ENCOUNTER — Telehealth: Payer: Self-pay | Admitting: *Deleted

## 2018-02-23 NOTE — Telephone Encounter (Signed)
Pt picked up free 30 day supply of Eliquis and was concerned about future cost. Spoke with [facility] and cost after insurance pays would be $288/month. Pt aware and will pick up pt assistance form as she cannot afford Eliquis. Will also forward to Dr Raylene Everts nurse for f/u

## 2018-02-24 DIAGNOSIS — Z8701 Personal history of pneumonia (recurrent): Secondary | ICD-10-CM | POA: Diagnosis not present

## 2018-02-24 DIAGNOSIS — J449 Chronic obstructive pulmonary disease, unspecified: Secondary | ICD-10-CM | POA: Diagnosis not present

## 2018-02-24 DIAGNOSIS — M1711 Unilateral primary osteoarthritis, right knee: Secondary | ICD-10-CM | POA: Diagnosis not present

## 2018-02-24 DIAGNOSIS — Z87891 Personal history of nicotine dependence: Secondary | ICD-10-CM | POA: Diagnosis not present

## 2018-02-24 DIAGNOSIS — I471 Supraventricular tachycardia: Secondary | ICD-10-CM | POA: Diagnosis not present

## 2018-02-24 DIAGNOSIS — M1712 Unilateral primary osteoarthritis, left knee: Secondary | ICD-10-CM | POA: Diagnosis not present

## 2018-02-26 DIAGNOSIS — J449 Chronic obstructive pulmonary disease, unspecified: Secondary | ICD-10-CM | POA: Diagnosis not present

## 2018-02-26 DIAGNOSIS — I471 Supraventricular tachycardia: Secondary | ICD-10-CM | POA: Diagnosis not present

## 2018-02-26 DIAGNOSIS — Z8701 Personal history of pneumonia (recurrent): Secondary | ICD-10-CM | POA: Diagnosis not present

## 2018-02-26 DIAGNOSIS — M1712 Unilateral primary osteoarthritis, left knee: Secondary | ICD-10-CM | POA: Diagnosis not present

## 2018-02-26 DIAGNOSIS — Z87891 Personal history of nicotine dependence: Secondary | ICD-10-CM | POA: Diagnosis not present

## 2018-02-26 DIAGNOSIS — M1711 Unilateral primary osteoarthritis, right knee: Secondary | ICD-10-CM | POA: Diagnosis not present

## 2018-02-27 DIAGNOSIS — M1711 Unilateral primary osteoarthritis, right knee: Secondary | ICD-10-CM | POA: Diagnosis not present

## 2018-02-27 DIAGNOSIS — Z87891 Personal history of nicotine dependence: Secondary | ICD-10-CM | POA: Diagnosis not present

## 2018-02-27 DIAGNOSIS — M1712 Unilateral primary osteoarthritis, left knee: Secondary | ICD-10-CM | POA: Diagnosis not present

## 2018-02-27 DIAGNOSIS — Z8701 Personal history of pneumonia (recurrent): Secondary | ICD-10-CM | POA: Diagnosis not present

## 2018-02-27 DIAGNOSIS — I471 Supraventricular tachycardia: Secondary | ICD-10-CM | POA: Diagnosis not present

## 2018-02-27 DIAGNOSIS — J449 Chronic obstructive pulmonary disease, unspecified: Secondary | ICD-10-CM | POA: Diagnosis not present

## 2018-03-02 DIAGNOSIS — I471 Supraventricular tachycardia: Secondary | ICD-10-CM | POA: Diagnosis not present

## 2018-03-02 DIAGNOSIS — Z8701 Personal history of pneumonia (recurrent): Secondary | ICD-10-CM | POA: Diagnosis not present

## 2018-03-02 DIAGNOSIS — J449 Chronic obstructive pulmonary disease, unspecified: Secondary | ICD-10-CM | POA: Diagnosis not present

## 2018-03-02 DIAGNOSIS — M1712 Unilateral primary osteoarthritis, left knee: Secondary | ICD-10-CM | POA: Diagnosis not present

## 2018-03-02 DIAGNOSIS — Z87891 Personal history of nicotine dependence: Secondary | ICD-10-CM | POA: Diagnosis not present

## 2018-03-02 DIAGNOSIS — M1711 Unilateral primary osteoarthritis, right knee: Secondary | ICD-10-CM | POA: Diagnosis not present

## 2018-03-03 ENCOUNTER — Telehealth: Payer: Self-pay | Admitting: Cardiovascular Disease

## 2018-03-03 DIAGNOSIS — Z87891 Personal history of nicotine dependence: Secondary | ICD-10-CM | POA: Diagnosis not present

## 2018-03-03 DIAGNOSIS — J449 Chronic obstructive pulmonary disease, unspecified: Secondary | ICD-10-CM | POA: Diagnosis not present

## 2018-03-03 DIAGNOSIS — M1712 Unilateral primary osteoarthritis, left knee: Secondary | ICD-10-CM | POA: Diagnosis not present

## 2018-03-03 DIAGNOSIS — Z8701 Personal history of pneumonia (recurrent): Secondary | ICD-10-CM | POA: Diagnosis not present

## 2018-03-03 DIAGNOSIS — M1711 Unilateral primary osteoarthritis, right knee: Secondary | ICD-10-CM | POA: Diagnosis not present

## 2018-03-03 DIAGNOSIS — I471 Supraventricular tachycardia: Secondary | ICD-10-CM | POA: Diagnosis not present

## 2018-03-03 NOTE — Telephone Encounter (Signed)
Called to state that she does not drive anywhere out of the [place] and decline appt with [facility] in [place]

## 2018-03-03 NOTE — Telephone Encounter (Signed)
Will forward to Dr Rayann Heman if would consider seeing this pt new in [place]

## 2018-03-03 NOTE — Telephone Encounter (Signed)
Looks like she is being sent to me to consider atrial flutter ablation which would be done in [place] also.  I would need to see her in the [place] office.  Perhaps someone will bring her.

## 2018-03-04 DIAGNOSIS — S90451A Superficial foreign body, right great toe, initial encounter: Secondary | ICD-10-CM | POA: Diagnosis not present

## 2018-03-04 DIAGNOSIS — M79671 Pain in right foot: Secondary | ICD-10-CM | POA: Diagnosis not present

## 2018-03-04 NOTE — Telephone Encounter (Signed)
LM to return call to clarify info

## 2018-03-04 NOTE — Telephone Encounter (Signed)
Pt mother Horris Latino) is pt caretaker and cannot drive out of town herself (she is 89) says she could drive to [place] if possible

## 2018-03-05 ENCOUNTER — Other Ambulatory Visit: Payer: Self-pay | Admitting: *Deleted

## 2018-03-05 DIAGNOSIS — Z87891 Personal history of nicotine dependence: Secondary | ICD-10-CM | POA: Diagnosis not present

## 2018-03-05 DIAGNOSIS — M1711 Unilateral primary osteoarthritis, right knee: Secondary | ICD-10-CM | POA: Diagnosis not present

## 2018-03-05 DIAGNOSIS — I471 Supraventricular tachycardia: Secondary | ICD-10-CM | POA: Diagnosis not present

## 2018-03-05 DIAGNOSIS — J449 Chronic obstructive pulmonary disease, unspecified: Secondary | ICD-10-CM | POA: Diagnosis not present

## 2018-03-05 DIAGNOSIS — Z8701 Personal history of pneumonia (recurrent): Secondary | ICD-10-CM | POA: Diagnosis not present

## 2018-03-05 DIAGNOSIS — M1712 Unilateral primary osteoarthritis, left knee: Secondary | ICD-10-CM | POA: Diagnosis not present

## 2018-03-05 MED ORDER — APIXABAN 5 MG PO TABS: 5.0000 mg | ORAL_TABLET | Freq: Two times a day (BID) | ORAL | 3 refills | Status: DC

## 2018-03-06 DIAGNOSIS — Z8701 Personal history of pneumonia (recurrent): Secondary | ICD-10-CM | POA: Diagnosis not present

## 2018-03-06 DIAGNOSIS — J449 Chronic obstructive pulmonary disease, unspecified: Secondary | ICD-10-CM | POA: Diagnosis not present

## 2018-03-06 DIAGNOSIS — I471 Supraventricular tachycardia: Secondary | ICD-10-CM | POA: Diagnosis not present

## 2018-03-06 DIAGNOSIS — Z87891 Personal history of nicotine dependence: Secondary | ICD-10-CM | POA: Diagnosis not present

## 2018-03-06 DIAGNOSIS — M1711 Unilateral primary osteoarthritis, right knee: Secondary | ICD-10-CM | POA: Diagnosis not present

## 2018-03-06 DIAGNOSIS — M1712 Unilateral primary osteoarthritis, left knee: Secondary | ICD-10-CM | POA: Diagnosis not present

## 2018-03-09 ENCOUNTER — Institutional Professional Consult (permissible substitution): Payer: Medicare Other | Admitting: Internal Medicine

## 2018-03-10 DIAGNOSIS — J449 Chronic obstructive pulmonary disease, unspecified: Secondary | ICD-10-CM | POA: Diagnosis not present

## 2018-03-10 DIAGNOSIS — I471 Supraventricular tachycardia: Secondary | ICD-10-CM | POA: Diagnosis not present

## 2018-03-10 DIAGNOSIS — Z8701 Personal history of pneumonia (recurrent): Secondary | ICD-10-CM | POA: Diagnosis not present

## 2018-03-10 DIAGNOSIS — Z87891 Personal history of nicotine dependence: Secondary | ICD-10-CM | POA: Diagnosis not present

## 2018-03-10 DIAGNOSIS — M1711 Unilateral primary osteoarthritis, right knee: Secondary | ICD-10-CM | POA: Diagnosis not present

## 2018-03-10 DIAGNOSIS — M1712 Unilateral primary osteoarthritis, left knee: Secondary | ICD-10-CM | POA: Diagnosis not present

## 2018-03-12 DIAGNOSIS — M1712 Unilateral primary osteoarthritis, left knee: Secondary | ICD-10-CM | POA: Diagnosis not present

## 2018-03-12 DIAGNOSIS — Z8701 Personal history of pneumonia (recurrent): Secondary | ICD-10-CM | POA: Diagnosis not present

## 2018-03-12 DIAGNOSIS — M1711 Unilateral primary osteoarthritis, right knee: Secondary | ICD-10-CM | POA: Diagnosis not present

## 2018-03-12 DIAGNOSIS — I471 Supraventricular tachycardia: Secondary | ICD-10-CM | POA: Diagnosis not present

## 2018-03-12 DIAGNOSIS — Z87891 Personal history of nicotine dependence: Secondary | ICD-10-CM | POA: Diagnosis not present

## 2018-03-12 DIAGNOSIS — J449 Chronic obstructive pulmonary disease, unspecified: Secondary | ICD-10-CM | POA: Diagnosis not present

## 2018-03-17 ENCOUNTER — Ambulatory Visit (INDEPENDENT_AMBULATORY_CARE_PROVIDER_SITE_OTHER): Payer: Medicare Other | Admitting: *Deleted

## 2018-03-17 DIAGNOSIS — Z5181 Encounter for therapeutic drug level monitoring: Secondary | ICD-10-CM | POA: Diagnosis not present

## 2018-03-17 DIAGNOSIS — I471 Supraventricular tachycardia: Secondary | ICD-10-CM | POA: Diagnosis not present

## 2018-03-17 DIAGNOSIS — M1711 Unilateral primary osteoarthritis, right knee: Secondary | ICD-10-CM | POA: Diagnosis not present

## 2018-03-17 DIAGNOSIS — I4891 Unspecified atrial fibrillation: Secondary | ICD-10-CM

## 2018-03-17 DIAGNOSIS — Z8701 Personal history of pneumonia (recurrent): Secondary | ICD-10-CM | POA: Diagnosis not present

## 2018-03-17 DIAGNOSIS — Z87891 Personal history of nicotine dependence: Secondary | ICD-10-CM | POA: Diagnosis not present

## 2018-03-17 DIAGNOSIS — M1712 Unilateral primary osteoarthritis, left knee: Secondary | ICD-10-CM | POA: Diagnosis not present

## 2018-03-17 DIAGNOSIS — J449 Chronic obstructive pulmonary disease, unspecified: Secondary | ICD-10-CM | POA: Diagnosis not present

## 2018-03-17 NOTE — Addendum Note (Signed)
Addended by: Malen Gauze on: 03/17/2018 10:50 AM   Modules accepted: Orders

## 2018-03-17 NOTE — Progress Notes (Addendum)
Pt was started on Eliquis 5mg  BID for Atrial fib/flutter on 02/17/18 by Dr Bronson Ing..    Reviewed patients medication list.  Pt is not currently on any combined P-gp and strong CYP3A4 inhibitors/inducers (ketoconazole, traconazole, ritonavir, carbamazepine, phenytoin, rifampin, St. John's wort).  Reviewed labs from 02/17/18.  SCr 0.72, Weight 123.4kg, CrCl 149.24.  Dose is appropriate based on age, weight, and SCr.  Hgb and HCT:  13.8/41.9  Plts 219K  Repeat labs 04/06/18:  SCr 0.82  CrCl 127.85  Hgb 14.4  Hct 43.2  Plts 198  A full discussion of the nature of anticoagulants has been carried out.  A benefit/risk analysis has been presented to the patient, so that they understand the justification for choosing anticoagulation with Eliquis at this time.  The need for compliance is stressed.  Pt is aware to take the medication twice daily.  Side effects of potential bleeding are discussed, including unusual colored urine or stools, coughing up blood or coffee ground emesis, nose bleeds or serious fall or head trauma.  Discussed signs and symptoms of stroke. The patient should avoid any OTC items containing aspirin or ibuprofen.  Avoid alcohol consumption.   Call if any signs of abnormal bleeding.  Discussed financial obligations and resolved any difficulty in obtaining medication.  Next lab test in 6 months.   Pt aware of lab results.  (S/P successful A flutter ablation 04/08/18 by Dr Lovena Le)  May be able to stop Eliquis in the future.

## 2018-03-19 DIAGNOSIS — M1712 Unilateral primary osteoarthritis, left knee: Secondary | ICD-10-CM | POA: Diagnosis not present

## 2018-03-19 DIAGNOSIS — I471 Supraventricular tachycardia: Secondary | ICD-10-CM | POA: Diagnosis not present

## 2018-03-19 DIAGNOSIS — Z8701 Personal history of pneumonia (recurrent): Secondary | ICD-10-CM | POA: Diagnosis not present

## 2018-03-19 DIAGNOSIS — J449 Chronic obstructive pulmonary disease, unspecified: Secondary | ICD-10-CM | POA: Diagnosis not present

## 2018-03-19 DIAGNOSIS — Z87891 Personal history of nicotine dependence: Secondary | ICD-10-CM | POA: Diagnosis not present

## 2018-03-19 DIAGNOSIS — M1711 Unilateral primary osteoarthritis, right knee: Secondary | ICD-10-CM | POA: Diagnosis not present

## 2018-03-24 DIAGNOSIS — J449 Chronic obstructive pulmonary disease, unspecified: Secondary | ICD-10-CM | POA: Diagnosis not present

## 2018-03-24 DIAGNOSIS — Z8701 Personal history of pneumonia (recurrent): Secondary | ICD-10-CM | POA: Diagnosis not present

## 2018-03-24 DIAGNOSIS — M1711 Unilateral primary osteoarthritis, right knee: Secondary | ICD-10-CM | POA: Diagnosis not present

## 2018-03-24 DIAGNOSIS — I471 Supraventricular tachycardia: Secondary | ICD-10-CM | POA: Diagnosis not present

## 2018-03-24 DIAGNOSIS — M1712 Unilateral primary osteoarthritis, left knee: Secondary | ICD-10-CM | POA: Diagnosis not present

## 2018-03-24 DIAGNOSIS — Z87891 Personal history of nicotine dependence: Secondary | ICD-10-CM | POA: Diagnosis not present

## 2018-03-26 DIAGNOSIS — Z8701 Personal history of pneumonia (recurrent): Secondary | ICD-10-CM | POA: Diagnosis not present

## 2018-03-26 DIAGNOSIS — I4892 Unspecified atrial flutter: Secondary | ICD-10-CM | POA: Diagnosis not present

## 2018-03-26 DIAGNOSIS — J449 Chronic obstructive pulmonary disease, unspecified: Secondary | ICD-10-CM | POA: Diagnosis not present

## 2018-03-26 DIAGNOSIS — I471 Supraventricular tachycardia: Secondary | ICD-10-CM | POA: Diagnosis not present

## 2018-03-26 DIAGNOSIS — R569 Unspecified convulsions: Secondary | ICD-10-CM | POA: Diagnosis not present

## 2018-03-26 DIAGNOSIS — M170 Bilateral primary osteoarthritis of knee: Secondary | ICD-10-CM | POA: Diagnosis not present

## 2018-03-26 DIAGNOSIS — Z87891 Personal history of nicotine dependence: Secondary | ICD-10-CM | POA: Diagnosis not present

## 2018-03-31 DIAGNOSIS — M170 Bilateral primary osteoarthritis of knee: Secondary | ICD-10-CM | POA: Diagnosis not present

## 2018-03-31 DIAGNOSIS — I4892 Unspecified atrial flutter: Secondary | ICD-10-CM | POA: Diagnosis not present

## 2018-03-31 DIAGNOSIS — R569 Unspecified convulsions: Secondary | ICD-10-CM | POA: Diagnosis not present

## 2018-03-31 DIAGNOSIS — I471 Supraventricular tachycardia: Secondary | ICD-10-CM | POA: Diagnosis not present

## 2018-03-31 DIAGNOSIS — Z8701 Personal history of pneumonia (recurrent): Secondary | ICD-10-CM | POA: Diagnosis not present

## 2018-03-31 DIAGNOSIS — J449 Chronic obstructive pulmonary disease, unspecified: Secondary | ICD-10-CM | POA: Diagnosis not present

## 2018-04-02 ENCOUNTER — Ambulatory Visit (INDEPENDENT_AMBULATORY_CARE_PROVIDER_SITE_OTHER): Payer: Medicare Other | Admitting: Internal Medicine

## 2018-04-02 ENCOUNTER — Encounter: Payer: Self-pay | Admitting: Internal Medicine

## 2018-04-02 ENCOUNTER — Encounter: Payer: Self-pay | Admitting: *Deleted

## 2018-04-02 VITALS — BP 118/86 | HR 132 | Ht 66.5 in | Wt 274.0 lb

## 2018-04-02 DIAGNOSIS — Z8701 Personal history of pneumonia (recurrent): Secondary | ICD-10-CM | POA: Diagnosis not present

## 2018-04-02 DIAGNOSIS — I480 Paroxysmal atrial fibrillation: Secondary | ICD-10-CM

## 2018-04-02 DIAGNOSIS — I471 Supraventricular tachycardia: Secondary | ICD-10-CM | POA: Diagnosis not present

## 2018-04-02 DIAGNOSIS — M170 Bilateral primary osteoarthritis of knee: Secondary | ICD-10-CM | POA: Diagnosis not present

## 2018-04-02 DIAGNOSIS — I4892 Unspecified atrial flutter: Secondary | ICD-10-CM | POA: Diagnosis not present

## 2018-04-02 DIAGNOSIS — J449 Chronic obstructive pulmonary disease, unspecified: Secondary | ICD-10-CM | POA: Diagnosis not present

## 2018-04-02 DIAGNOSIS — R569 Unspecified convulsions: Secondary | ICD-10-CM | POA: Diagnosis not present

## 2018-04-02 NOTE — Progress Notes (Addendum)
HPI Autumn Parrish is referred today by Dr. Jacinta Shoe for evaluation of atrial flutter. She is a pleasant 67 yo woman with HTN who also has a h/o dyspnea who was found to have atrial flutter several months ago. She saw Dr. Jacinta Shoe a month ago and is referred for additional evaluation. She has atrial flutter with a RVR. She does not have palpitations and denies chest pain. No syncope.  Allergies  Allergen Reactions  . Nsaids     Upset stomach can tolerate ibuprofen      Current Outpatient Medications  Medication Sig Dispense Refill  . amphetamine-dextroamphetamine (ADDERALL) 30 MG tablet Take 30 mg by mouth 2 (two) times daily.    Marland Kitchen apixaban (ELIQUIS) 5 MG TABS tablet Take 1 tablet (5 mg total) by mouth 2 (two) times daily. 180 tablet 3  . Cholecalciferol (VITAMIN D) 2000 units CAPS Take 1 capsule by mouth daily.    Marland Kitchen diltiazem (CARDIZEM CD) 240 MG 24 hr capsule Take 240 mg by mouth daily.    Marland Kitchen ibuprofen (ADVIL,MOTRIN) 800 MG tablet Take 800 mg by mouth every 8 (eight) hours as needed.    Marland Kitchen levothyroxine (SYNTHROID, LEVOTHROID) 50 MCG tablet Take 50 mcg by mouth daily before breakfast.    . metoprolol tartrate (LOPRESSOR) 25 MG tablet Take 25 mg by mouth 2 (two) times daily.    . Multiple Vitamins-Minerals (CENTRUM ADULTS PO) Take by mouth.    . oxybutynin (DITROPAN XL) 15 MG 24 hr tablet Take 15 mg by mouth at bedtime.    . timolol (BETIMOL) 0.5 % ophthalmic solution Place 1 drop into both eyes 2 (two) times daily.     No current facility-administered medications for this visit.      No past medical history on file.  ROS:   All systems reviewed and negative except as noted in the HPI.   No past surgical history on file.   No family history on file.   Social History   Socioeconomic History  . Marital status: Divorced    Spouse name: Not on file  . Number of children: Not on file  . Years of education: Not on file  . Highest education level: Not on file    Occupational History  . Not on file  Social Needs  . Financial resource strain: Not on file  . Food insecurity:    Worry: Not on file    Inability: Not on file  . Transportation needs:    Medical: Not on file    Non-medical: Not on file  Tobacco Use  . Smoking status: Former Smoker    Types: Cigarettes    Last attempt to quit: 02/17/2013    Years since quitting: 5.1  . Smokeless tobacco: Never Used  Substance and Sexual Activity  . Alcohol use: Not on file  . Drug use: Not on file  . Sexual activity: Not on file  Lifestyle  . Physical activity:    Days per week: Not on file    Minutes per session: Not on file  . Stress: Not on file  Relationships  . Social connections:    Talks on phone: Not on file    Gets together: Not on file    Attends religious service: Not on file    Active member of club or organization: Not on file    Attends meetings of clubs or organizations: Not on file    Relationship status: Not on file  . Intimate partner violence:  Fear of current or ex partner: Not on file    Emotionally abused: Not on file    Physically abused: Not on file    Forced sexual activity: Not on file  Other Topics Concern  . Not on file  Social History Narrative  . Not on file     BP 118/86   Pulse (!) 132   Ht 5' 6.5" (1.689 m)   Wt 274 lb (124.3 kg)   SpO2 95%   BMI 43.56 kg/m   Physical Exam:  Well appearing 67 yo woman, with a tremor in the right hand, NAD HEENT: Unremarkable Neck:  6 cm JVD, no thyromegally Lymphatics:  No adenopathy Back:  No CVA tenderness Lungs:  Clear with no wheezes HEART:  Regular tachy rhythm, no murmurs, no rubs, no clicks Abd:  soft, positive bowel sounds, no organomegally, no rebound, no guarding Ext:  2 plus pulses, no edema, no cyanosis, no clubbing Skin:  No rashes no nodules Neuro:  CN II through XII intact, motor grossly intact  EKG - atrial flutter with 2:1 AV conduction.   Assess/Plan: 1. Persistent atrial  flutter with a RVR - I have discussed the treatment options with the patient and her mother. I have recommended catheter ablation. The risks/benefits/goals/expectations of EP study and ablation were reviewed. She will call us if she wishes to proceed. I have asked her to take her Eliqus without missing any doses until the day of the procedure. We discussed the likely development of CHF if she continues to live with uncontrolled atrial flutter. Also discussed the difference between atrial fib and flutter. I would have the expectation that she be able to stop her eliquis if we can ablation her flutter.  2. Coags - she appears to be tolerating systemic anti-coagulation with OAC's.

## 2018-04-02 NOTE — Patient Instructions (Signed)
Medication Instructions:  Your physician recommends that you continue on your current medications as directed. Please refer to the Current Medication list given to you today.   Labwork: NONE   Testing/Procedures: NONE   Follow-Up: Your physician recommends that you schedule a follow-up appointment with Dr. Lovena Le    Any Other Special Instructions Will Be Listed Below (If Applicable). Ablation Dates : 4/30, 5/1, 5/6, 5/6, 5/8, 5/9     If you need a refill on your cardiac medications before your next appointment, please call your pharmacy.  Thank you for choosing [facility]!

## 2018-04-02 NOTE — H&P (View-Only) (Signed)
HPI Autumn Parrish is referred today by Dr. Jacinta Shoe for evaluation of atrial flutter. She is a pleasant 67 yo woman with HTN who also has a h/o dyspnea who was found to have atrial flutter several months ago. She saw Dr. Jacinta Shoe a month ago and is referred for additional evaluation. She has atrial flutter with a RVR. She does not have palpitations and denies chest pain. No syncope.  Allergies  Allergen Reactions  . Nsaids     Upset stomach can tolerate ibuprofen      Current Outpatient Medications  Medication Sig Dispense Refill  . amphetamine-dextroamphetamine (ADDERALL) 30 MG tablet Take 30 mg by mouth 2 (two) times daily.    Marland Kitchen apixaban (ELIQUIS) 5 MG TABS tablet Take 1 tablet (5 mg total) by mouth 2 (two) times daily. 180 tablet 3  . Cholecalciferol (VITAMIN D) 2000 units CAPS Take 1 capsule by mouth daily.    Marland Kitchen diltiazem (CARDIZEM CD) 240 MG 24 hr capsule Take 240 mg by mouth daily.    Marland Kitchen ibuprofen (ADVIL,MOTRIN) 800 MG tablet Take 800 mg by mouth every 8 (eight) hours as needed.    Marland Kitchen levothyroxine (SYNTHROID, LEVOTHROID) 50 MCG tablet Take 50 mcg by mouth daily before breakfast.    . metoprolol tartrate (LOPRESSOR) 25 MG tablet Take 25 mg by mouth 2 (two) times daily.    . Multiple Vitamins-Minerals (CENTRUM ADULTS PO) Take by mouth.    . oxybutynin (DITROPAN XL) 15 MG 24 hr tablet Take 15 mg by mouth at bedtime.    . timolol (BETIMOL) 0.5 % ophthalmic solution Place 1 drop into both eyes 2 (two) times daily.     No current facility-administered medications for this visit.      No past medical history on file.  ROS:   All systems reviewed and negative except as noted in the HPI.   No past surgical history on file.   No family history on file.   Social History   Socioeconomic History  . Marital status: Divorced    Spouse name: Not on file  . Number of children: Not on file  . Years of education: Not on file  . Highest education level: Not on file    Occupational History  . Not on file  Social Needs  . Financial resource strain: Not on file  . Food insecurity:    Worry: Not on file    Inability: Not on file  . Transportation needs:    Medical: Not on file    Non-medical: Not on file  Tobacco Use  . Smoking status: Former Smoker    Types: Cigarettes    Last attempt to quit: 02/17/2013    Years since quitting: 5.1  . Smokeless tobacco: Never Used  Substance and Sexual Activity  . Alcohol use: Not on file  . Drug use: Not on file  . Sexual activity: Not on file  Lifestyle  . Physical activity:    Days per week: Not on file    Minutes per session: Not on file  . Stress: Not on file  Relationships  . Social connections:    Talks on phone: Not on file    Gets together: Not on file    Attends religious service: Not on file    Active member of club or organization: Not on file    Attends meetings of clubs or organizations: Not on file    Relationship status: Not on file  . Intimate partner violence:  Fear of current or ex partner: Not on file    Emotionally abused: Not on file    Physically abused: Not on file    Forced sexual activity: Not on file  Other Topics Concern  . Not on file  Social History Narrative  . Not on file     BP 118/86   Pulse (!) 132   Ht 5' 6.5" (1.689 m)   Wt 274 lb (124.3 kg)   SpO2 95%   BMI 43.56 kg/m   Physical Exam:  Well appearing 67 yo woman, with a tremor in the right hand, NAD HEENT: Unremarkable Neck:  6 cm JVD, no thyromegally Lymphatics:  No adenopathy Back:  No CVA tenderness Lungs:  Clear with no wheezes HEART:  Regular tachy rhythm, no murmurs, no rubs, no clicks Abd:  soft, positive bowel sounds, no organomegally, no rebound, no guarding Ext:  2 plus pulses, no edema, no cyanosis, no clubbing Skin:  No rashes no nodules Neuro:  CN II through XII intact, motor grossly intact  EKG - atrial flutter with 2:1 AV conduction.   Assess/Plan: 1. Persistent atrial  flutter with a RVR - I have discussed the treatment options with the patient and her mother. I have recommended catheter ablation. The risks/benefits/goals/expectations of EP study and ablation were reviewed. She will call us if she wishes to proceed. I have asked her to take her Eliqus without missing any doses until the day of the procedure. We discussed the likely development of CHF if she continues to live with uncontrolled atrial flutter. Also discussed the difference between atrial fib and flutter. I would have the expectation that she be able to stop her eliquis if we can ablation her flutter.  2. Coags - she appears to be tolerating systemic anti-coagulation with OAC's.

## 2018-04-02 NOTE — Progress Notes (Signed)
Patient notified to Ablation date and time, and place. Pt voiced understanding.

## 2018-04-02 NOTE — Addendum Note (Signed)
Addended by: Levonne Hubert on: 04/02/2018 05:19 PM   Modules accepted: Orders

## 2018-04-06 ENCOUNTER — Other Ambulatory Visit (HOSPITAL_COMMUNITY)
Admission: RE | Admit: 2018-04-06 | Discharge: 2018-04-06 | Disposition: A | Discharge status: Home / Self Care | Payer: Medicare Other | Source: Ambulatory Visit | Attending: Internal Medicine | Admitting: Internal Medicine

## 2018-04-06 DIAGNOSIS — I480 Paroxysmal atrial fibrillation: Secondary | ICD-10-CM | POA: Insufficient documentation

## 2018-04-06 LAB — CBC WITH DIFFERENTIAL/PLATELET
BASOS ABS: 0 10*3/uL (ref 0.0–0.1)
BASOS PCT: 0 %
EOS PCT: 1 %
Eosinophils Absolute: 0.1 10*3/uL (ref 0.0–0.7)
HCT: 43.2 % (ref 36.0–46.0)
Hemoglobin: 14.4 g/dL (ref 12.0–15.0)
Lymphocytes Relative: 23 %
Lymphs Abs: 2.2 10*3/uL (ref 0.7–4.0)
MCH: 30.8 pg (ref 26.0–34.0)
MCHC: 33.3 g/dL (ref 30.0–36.0)
MCV: 92.5 fL (ref 78.0–100.0)
MONO ABS: 0.6 10*3/uL (ref 0.1–1.0)
Monocytes Relative: 6 %
Neutro Abs: 6.6 10*3/uL (ref 1.7–7.7)
Neutrophils Relative %: 70 %
PLATELETS: 198 10*3/uL (ref 150–400)
RBC: 4.67 MIL/uL (ref 3.87–5.11)
RDW: 13.4 % (ref 11.5–15.5)
WBC: 9.5 10*3/uL (ref 4.0–10.5)

## 2018-04-06 LAB — BASIC METABOLIC PANEL
ANION GAP: 11 (ref 5–15)
BUN: 20 mg/dL (ref 6–20)
CO2: 21 mmol/L — ABNORMAL LOW (ref 22–32)
CREATININE: 0.82 mg/dL (ref 0.44–1.00)
Calcium: 9.1 mg/dL (ref 8.9–10.3)
Chloride: 106 mmol/L (ref 101–111)
GLUCOSE: 111 mg/dL — AB (ref 65–99)
Potassium: 4.3 mmol/L (ref 3.5–5.1)
Sodium: 138 mmol/L (ref 135–145)

## 2018-04-07 ENCOUNTER — Telehealth: Payer: Self-pay | Admitting: Internal Medicine

## 2018-04-07 DIAGNOSIS — J449 Chronic obstructive pulmonary disease, unspecified: Secondary | ICD-10-CM | POA: Diagnosis not present

## 2018-04-07 DIAGNOSIS — Z8701 Personal history of pneumonia (recurrent): Secondary | ICD-10-CM | POA: Diagnosis not present

## 2018-04-07 DIAGNOSIS — I4892 Unspecified atrial flutter: Secondary | ICD-10-CM | POA: Diagnosis not present

## 2018-04-07 DIAGNOSIS — I471 Supraventricular tachycardia: Secondary | ICD-10-CM | POA: Diagnosis not present

## 2018-04-07 DIAGNOSIS — R569 Unspecified convulsions: Secondary | ICD-10-CM | POA: Diagnosis not present

## 2018-04-07 DIAGNOSIS — M170 Bilateral primary osteoarthritis of knee: Secondary | ICD-10-CM | POA: Diagnosis not present

## 2018-04-07 NOTE — Telephone Encounter (Signed)
Please call pt's mother @ [phone], she's wanting to know if the pt is supposed to take her apixaban (ELIQUIS) 5 MG TABS tablet [[id number]]  Before her ablation tomorrow

## 2018-04-07 NOTE — Telephone Encounter (Signed)
I referred to letter of instructions given  to patient at apt to follow.It states hold the am dose of Eliquis the morning of ablation.

## 2018-04-08 ENCOUNTER — Encounter (HOSPITAL_COMMUNITY)
Admission: RE | Disposition: A | Discharge status: Home / Self Care | Payer: Self-pay | Source: Ambulatory Visit | Attending: Internal Medicine

## 2018-04-08 ENCOUNTER — Ambulatory Visit (HOSPITAL_COMMUNITY)
Admission: RE | Admit: 2018-04-08 | Discharge: 2018-04-08 | Disposition: A | Discharge status: Home / Self Care | Payer: Medicare Other | Source: Ambulatory Visit | Attending: Internal Medicine | Admitting: Internal Medicine

## 2018-04-08 DIAGNOSIS — I483 Typical atrial flutter: Secondary | ICD-10-CM | POA: Diagnosis not present

## 2018-04-08 DIAGNOSIS — Z87891 Personal history of nicotine dependence: Secondary | ICD-10-CM | POA: Diagnosis not present

## 2018-04-08 DIAGNOSIS — Z7901 Long term (current) use of anticoagulants: Secondary | ICD-10-CM | POA: Insufficient documentation

## 2018-04-08 DIAGNOSIS — I10 Essential (primary) hypertension: Secondary | ICD-10-CM | POA: Insufficient documentation

## 2018-04-08 HISTORY — PX: A-FLUTTER ABLATION: EP1230

## 2018-04-08 SURGERY — A-FLUTTER ABLATION

## 2018-04-08 MED ORDER — HEPARIN (PORCINE) IN NACL 1000-0.9 UT/500ML-% IV SOLN
INTRAVENOUS | Status: AC
  Filled 2018-04-08: qty 1000

## 2018-04-08 MED ORDER — SODIUM CHLORIDE 0.9% FLUSH: 3.0000 mL | Freq: Two times a day (BID) | INTRAVENOUS | Status: DC

## 2018-04-08 MED ORDER — SODIUM CHLORIDE 0.9 % IV SOLN: 250.0000 mL | INTRAVENOUS | Status: DC | PRN

## 2018-04-08 MED ORDER — MIDAZOLAM HCL 5 MG/5ML IJ SOLN
INTRAMUSCULAR | Status: AC
  Filled 2018-04-08: qty 5

## 2018-04-08 MED ORDER — FENTANYL CITRATE (PF) 100 MCG/2ML IJ SOLN
INTRAMUSCULAR | Status: AC
  Filled 2018-04-08: qty 2

## 2018-04-08 MED ORDER — MIDAZOLAM HCL 5 MG/5ML IJ SOLN
INTRAMUSCULAR | Status: DC | PRN
  Administered 2018-04-08: 1 mg via INTRAVENOUS
  Administered 2018-04-08: 2 mg via INTRAVENOUS
  Administered 2018-04-08 (×2): 1 mg via INTRAVENOUS
  Administered 2018-04-08 (×2): 2 mg via INTRAVENOUS
  Administered 2018-04-08: 1 mg via INTRAVENOUS
  Administered 2018-04-08: 2 mg via INTRAVENOUS

## 2018-04-08 MED ORDER — SODIUM CHLORIDE 0.9 % IV SOLN
INTRAVENOUS | Status: DC
  Administered 2018-04-08: 07:00:00 via INTRAVENOUS

## 2018-04-08 MED ORDER — HEPARIN (PORCINE) IN NACL 1000-0.9 UT/500ML-% IV SOLN
INTRAVENOUS | Status: AC
  Filled 2018-04-08: qty 500

## 2018-04-08 MED ORDER — BUPIVACAINE HCL (PF) 0.25 % IJ SOLN
INTRAMUSCULAR | Status: AC
  Filled 2018-04-08: qty 30

## 2018-04-08 MED ORDER — SODIUM CHLORIDE 0.9% FLUSH: 3.0000 mL | INTRAVENOUS | Status: DC | PRN

## 2018-04-08 MED ORDER — ACETAMINOPHEN 325 MG PO TABS
650.0000 mg | ORAL_TABLET | ORAL | Status: DC | PRN
  Filled 2018-04-08: qty 2

## 2018-04-08 MED ORDER — ONDANSETRON HCL 4 MG/2ML IJ SOLN: 4.0000 mg | Freq: Four times a day (QID) | INTRAMUSCULAR | Status: DC | PRN

## 2018-04-08 MED ORDER — FENTANYL CITRATE (PF) 100 MCG/2ML IJ SOLN
INTRAMUSCULAR | Status: DC | PRN
  Administered 2018-04-08 (×2): 25 ug via INTRAVENOUS
  Administered 2018-04-08 (×3): 12.5 ug via INTRAVENOUS
  Administered 2018-04-08: 25 ug via INTRAVENOUS
  Administered 2018-04-08: 12.5 ug via INTRAVENOUS
  Administered 2018-04-08: 25 ug via INTRAVENOUS

## 2018-04-08 MED ORDER — BUPIVACAINE HCL (PF) 0.25 % IJ SOLN
INTRAMUSCULAR | Status: DC | PRN
  Administered 2018-04-08: 60 mL

## 2018-04-08 SURGICAL SUPPLY — 11 items
BAG SNAP BAND KOVER 36X36 (MISCELLANEOUS) ×2 IMPLANT
CATH BLAZERPRIME XP (ABLATOR) ×2 IMPLANT
CATH JOSEPH QUAD ALLRED 6F REP (CATHETERS) ×2 IMPLANT
CATH POLARIS X 2.5/5/2.5 DECAP (CATHETERS) ×2 IMPLANT
PACK EP LATEX FREE (CUSTOM PROCEDURE TRAY) ×3
PACK EP LF (CUSTOM PROCEDURE TRAY) IMPLANT
PAD DEFIB LIFELINK (PAD) ×2 IMPLANT
SHEATH AVANTI 11CM 6FR (SHEATH) ×2 IMPLANT
SHEATH AVANTI 11CM 7FR (SHEATH) ×2 IMPLANT
SHEATH AVANTI 11CM 8FR (SHEATH) ×2 IMPLANT
SHIELD RADPAD SCOOP 12X17 (MISCELLANEOUS) ×2 IMPLANT

## 2018-04-08 NOTE — Interval H&P Note (Signed)
History and Physical Interval Note:  04/08/2018 7:53 AM  Autumn Parrish  has presented today for surgery, with the diagnosis of Atrial flutter  The various methods of treatment have been discussed with the patient and family. After consideration of risks, benefits and other options for treatment, the patient has consented to  Procedure(s): A-FLUTTER ABLATION (N/A) as a surgical intervention .  The patient's history has been reviewed, patient examined, no change in status, stable for surgery.  I have reviewed the patient's chart and labs.  Questions were answered to the patient's satisfaction.     Cristopher Peru

## 2018-04-08 NOTE — Discharge Instructions (Signed)
No driving for 4 days. No lifting over 5 lbs for 1 week. No sexual activity for 1 week. You may return to work in 1 week. Keep procedure site clean & dry. If you notice increased pain, swelling, bleeding or pus, call/return!  You may shower, but no soaking baths/hot tubs/pools for 1 week.    Cardiac Ablation, Care After This sheet gives you information about how to care for yourself after your procedure. Your health care provider may also give you more specific instructions. If you have problems or questions, contact your health care provider. What can I expect after the procedure? After the procedure, it is common to have:  Bruising around your puncture site.  Tenderness around your puncture site.  Skipped heartbeats.  Tiredness (fatigue).  Follow these instructions at home: Puncture site care  Follow instructions from your health care provider about how to take care of your puncture site. Make sure you: ? Wash your hands with soap and water before you remove your bandage (dressing). If soap and water are not available, use hand sanitizer. ? Dressing may be removed 24-48 hours after procedure.   Check your puncture site every day for signs of infection. Check for: ? Redness, swelling, or pain. ? Fluid or blood. If your puncture site starts to bleed, lie down on your back, apply firm pressure to the area, and contact your health care provider. ? Warmth. ? Pus or a bad smell. Driving  Ask your health care provider when it is safe for you to drive again after the procedure.  Do not drive or use heavy machinery while taking prescription pain medicine.  Do not drive for 24 hours if you were given a medicine to help you relax (sedative) during your procedure. Activity  Avoid activities that take a lot of effort for at least 3 days after your procedure.  Do not lift anything that is heavier than 5 lb (4.5 kg), or the limit that you are told, until your health care provider says that  it is safe.  Return to your normal activities as told by your health care provider. Ask your health care provider what activities are safe for you. General instructions  Take over-the-counter and prescription medicines only as told by your health care provider.  Do not use any products that contain nicotine or tobacco, such as cigarettes and e-cigarettes. If you need help quitting, ask your health care provider.  Do not take baths, swim, or use a hot tub until your health care provider approves.  Do not drink alcohol for 24 hours after your procedure.  Keep all follow-up visits as told by your health care provider. This is important. Contact a health care provider if:  You have redness, mild swelling, or pain around your puncture site.  You have fluid or blood coming from your puncture site that stops after applying firm pressure to the area.  Your puncture site feels warm to the touch.  You have pus or a bad smell coming from your puncture site.  You have a fever.  You have chest pain or discomfort that spreads to your neck, jaw, or arm.  You are sweating a lot.  You feel nauseous.  You have a fast or irregular heartbeat.  You have shortness of breath.  You are dizzy or light-headed and feel the need to lie down.  You have pain or numbness in the arm or leg closest to your puncture site. Get help right away if:  Your puncture  site suddenly swells.  Your puncture site is bleeding and the bleeding does not stop after applying firm pressure to the area. These symptoms may represent a serious problem that is an emergency. Do not wait to see if the symptoms will go away. Get medical help right away. Call your local emergency services (911 in the [place] Do not drive yourself to the hospital. Summary  After the procedure, it is normal to have bruising and tenderness at the puncture site in your groin, neck, or forearm.  Check your puncture site every day for signs of  infection.  Get help right away if your puncture site is bleeding and the bleeding does not stop after applying firm pressure to the area. This is a medical emergency. This information is not intended to replace advice given to you by your health care provider. Make sure you discuss any questions you have with your health care provider. Document Released: 03/06/2017 Document Revised: 03/06/2017 Document Reviewed: 03/06/2017 Elsevier Interactive Patient Education  2018 [employer].

## 2018-04-08 NOTE — Progress Notes (Signed)
Pt ambulated with no issues, R groin level 0. Pt dressing for D/C.

## 2018-04-08 NOTE — Progress Notes (Signed)
Site area: Right groin a 6, 7, and 8 french venous sheaths were removed  Site Prior to Removal:  Level 0  Pressure Applied For 20 MINUTES    Bedrest Beginning at 1050am  Manual:   Yes.    Patient Status During Pull:  stable  Post Pull Groin Site:  Level 0  Post Pull Instructions Given:  Yes.    Post Pull Pulses Present:  Yes.    Dressing Applied:  Yes.    Comments:  VS remain stable

## 2018-04-09 ENCOUNTER — Encounter (HOSPITAL_COMMUNITY): Payer: Self-pay | Admitting: Internal Medicine

## 2018-04-09 MED FILL — Heparin Sod (Porcine)-NaCl IV Soln 1000 Unit/500ML-0.9%: INTRAVENOUS | Qty: 1500 | Status: AC

## 2018-04-14 DIAGNOSIS — I739 Peripheral vascular disease, unspecified: Secondary | ICD-10-CM | POA: Diagnosis not present

## 2018-04-21 DIAGNOSIS — Z8701 Personal history of pneumonia (recurrent): Secondary | ICD-10-CM | POA: Diagnosis not present

## 2018-04-21 DIAGNOSIS — E559 Vitamin D deficiency, unspecified: Secondary | ICD-10-CM | POA: Diagnosis not present

## 2018-04-21 DIAGNOSIS — Z0001 Encounter for general adult medical examination with abnormal findings: Secondary | ICD-10-CM | POA: Diagnosis not present

## 2018-04-21 DIAGNOSIS — Z6841 Body Mass Index (BMI) 40.0 and over, adult: Secondary | ICD-10-CM | POA: Diagnosis not present

## 2018-04-21 DIAGNOSIS — R569 Unspecified convulsions: Secondary | ICD-10-CM | POA: Diagnosis not present

## 2018-04-21 DIAGNOSIS — F909 Attention-deficit hyperactivity disorder, unspecified type: Secondary | ICD-10-CM | POA: Diagnosis not present

## 2018-04-21 DIAGNOSIS — I10 Essential (primary) hypertension: Secondary | ICD-10-CM | POA: Diagnosis not present

## 2018-04-21 DIAGNOSIS — E039 Hypothyroidism, unspecified: Secondary | ICD-10-CM | POA: Diagnosis not present

## 2018-04-21 DIAGNOSIS — Z1389 Encounter for screening for other disorder: Secondary | ICD-10-CM | POA: Diagnosis not present

## 2018-04-21 DIAGNOSIS — R7309 Other abnormal glucose: Secondary | ICD-10-CM | POA: Diagnosis not present

## 2018-04-21 DIAGNOSIS — I4892 Unspecified atrial flutter: Secondary | ICD-10-CM | POA: Diagnosis not present

## 2018-04-21 DIAGNOSIS — I471 Supraventricular tachycardia: Secondary | ICD-10-CM | POA: Diagnosis not present

## 2018-04-21 DIAGNOSIS — J449 Chronic obstructive pulmonary disease, unspecified: Secondary | ICD-10-CM | POA: Diagnosis not present

## 2018-04-21 DIAGNOSIS — M170 Bilateral primary osteoarthritis of knee: Secondary | ICD-10-CM | POA: Diagnosis not present

## 2018-04-21 DIAGNOSIS — E782 Mixed hyperlipidemia: Secondary | ICD-10-CM | POA: Diagnosis not present

## 2018-04-23 DIAGNOSIS — I471 Supraventricular tachycardia: Secondary | ICD-10-CM | POA: Diagnosis not present

## 2018-04-23 DIAGNOSIS — I4892 Unspecified atrial flutter: Secondary | ICD-10-CM | POA: Diagnosis not present

## 2018-04-23 DIAGNOSIS — J449 Chronic obstructive pulmonary disease, unspecified: Secondary | ICD-10-CM | POA: Diagnosis not present

## 2018-04-23 DIAGNOSIS — R569 Unspecified convulsions: Secondary | ICD-10-CM | POA: Diagnosis not present

## 2018-04-23 DIAGNOSIS — M170 Bilateral primary osteoarthritis of knee: Secondary | ICD-10-CM | POA: Diagnosis not present

## 2018-04-23 DIAGNOSIS — Z8701 Personal history of pneumonia (recurrent): Secondary | ICD-10-CM | POA: Diagnosis not present

## 2018-04-29 DIAGNOSIS — R569 Unspecified convulsions: Secondary | ICD-10-CM | POA: Diagnosis not present

## 2018-04-29 DIAGNOSIS — I4892 Unspecified atrial flutter: Secondary | ICD-10-CM | POA: Diagnosis not present

## 2018-04-29 DIAGNOSIS — J449 Chronic obstructive pulmonary disease, unspecified: Secondary | ICD-10-CM | POA: Diagnosis not present

## 2018-04-29 DIAGNOSIS — M170 Bilateral primary osteoarthritis of knee: Secondary | ICD-10-CM | POA: Diagnosis not present

## 2018-04-29 DIAGNOSIS — I471 Supraventricular tachycardia: Secondary | ICD-10-CM | POA: Diagnosis not present

## 2018-04-29 DIAGNOSIS — Z8701 Personal history of pneumonia (recurrent): Secondary | ICD-10-CM | POA: Diagnosis not present

## 2018-05-05 DIAGNOSIS — Z8701 Personal history of pneumonia (recurrent): Secondary | ICD-10-CM | POA: Diagnosis not present

## 2018-05-05 DIAGNOSIS — M170 Bilateral primary osteoarthritis of knee: Secondary | ICD-10-CM | POA: Diagnosis not present

## 2018-05-05 DIAGNOSIS — R569 Unspecified convulsions: Secondary | ICD-10-CM | POA: Diagnosis not present

## 2018-05-05 DIAGNOSIS — J449 Chronic obstructive pulmonary disease, unspecified: Secondary | ICD-10-CM | POA: Diagnosis not present

## 2018-05-05 DIAGNOSIS — I471 Supraventricular tachycardia: Secondary | ICD-10-CM | POA: Diagnosis not present

## 2018-05-05 DIAGNOSIS — I4892 Unspecified atrial flutter: Secondary | ICD-10-CM | POA: Diagnosis not present

## 2018-05-13 ENCOUNTER — Ambulatory Visit (INDEPENDENT_AMBULATORY_CARE_PROVIDER_SITE_OTHER): Payer: Medicare Other | Admitting: Internal Medicine

## 2018-05-13 ENCOUNTER — Encounter: Payer: Self-pay | Admitting: *Deleted

## 2018-05-13 VITALS — BP 120/78 | HR 56 | Ht 66.5 in | Wt 278.0 lb

## 2018-05-13 DIAGNOSIS — I483 Typical atrial flutter: Secondary | ICD-10-CM

## 2018-05-13 MED ORDER — FUROSEMIDE 20 MG PO TABS: 20.0000 mg | ORAL_TABLET | Freq: Every day | ORAL | 3 refills | Status: AC

## 2018-05-13 NOTE — Progress Notes (Signed)
HPI Autumn Parrish returns today after undergoing EPS/RFA of atrial flutter. She has done well in the interim, She notes mild fatigue. She admits to dietary indiscretion. No palpitations. She denies syncope or chest pain.  Allergies  Allergen Reactions  . Nsaids Other (See Comments)    Upset stomach can tolerate ibuprofen      Current Outpatient Medications  Medication Sig Dispense Refill  . acetaminophen (TYLENOL) 500 MG tablet Take 1,000 mg by mouth every 6 (six) hours as needed for moderate pain or headache.    . amphetamine-dextroamphetamine (ADDERALL) 30 MG tablet Take 30 mg by mouth 2 (two) times daily.    Marland Kitchen apixaban (ELIQUIS) 5 MG TABS tablet Take 1 tablet (5 mg total) by mouth 2 (two) times daily. 180 tablet 3  . diltiazem (CARDIZEM CD) 240 MG 24 hr capsule Take 240 mg by mouth daily.    Marland Kitchen ibuprofen (ADVIL,MOTRIN) 800 MG tablet Take 800 mg by mouth every 8 (eight) hours as needed for headache or moderate pain.     Marland Kitchen levothyroxine (SYNTHROID, LEVOTHROID) 50 MCG tablet Take 50 mcg by mouth daily before breakfast.    . metoprolol tartrate (LOPRESSOR) 25 MG tablet Take 25 mg by mouth 2 (two) times daily.    . Multiple Vitamins-Minerals (CENTRUM ADULTS PO) Take 1 tablet by mouth daily.     Marland Kitchen oxybutynin (DITROPAN XL) 15 MG 24 hr tablet Take 15 mg by mouth daily.     . timolol (BETIMOL) 0.5 % ophthalmic solution Place 1 drop into both eyes daily.      No current facility-administered medications for this visit.      Past Medical History:  Diagnosis Date  . ADD (attention deficit disorder)   . COPD (chronic obstructive pulmonary disease) ([place])   . Hypertension   . Hypothyroidism     ROS:   All systems reviewed and negative except as noted in the HPI.   Past Surgical History:  Procedure Laterality Date  . A-FLUTTER ABLATION N/A 04/08/2018   Procedure: A-FLUTTER ABLATION;  Surgeon: Autumn Lance, MD;  Location: [facility];  Service: Cardiovascular;  Laterality:  N/A;  . ABDOMINAL HYSTERECTOMY     age 56 d/t endometriosis  . KNEE ARTHROSCOPY Right   . NEUROMAL REMOVAL Right      Family History  Problem Relation Age of Onset  . Cancer Other   . Diabetes Other   . Heart disease Other   . Hypertension Other   . Obesity Other   . Alcoholism Other   . Stroke Other   . Hypercholesterolemia Other      Social History   Socioeconomic History  . Marital status: Divorced    Spouse name: Not on file  . Number of children: Not on file  . Years of education: Not on file  . Highest education level: Not on file  Occupational History  . Not on file  Social Needs  . Financial resource strain: Not on file  . Food insecurity:    Worry: Not on file    Inability: Not on file  . Transportation needs:    Medical: Not on file    Non-medical: Not on file  Tobacco Use  . Smoking status: Former Smoker    Types: Cigarettes    Last attempt to quit: 02/17/2013    Years since quitting: 5.2  . Smokeless tobacco: Never Used  Substance and Sexual Activity  . Alcohol use: Not Currently  . Drug use:  Not Currently  . Sexual activity: Not on file  Lifestyle  . Physical activity:    Days per week: Not on file    Minutes per session: Not on file  . Stress: Not on file  Relationships  . Social connections:    Talks on phone: Not on file    Gets together: Not on file    Attends religious service: Not on file    Active member of club or organization: Not on file    Attends meetings of clubs or organizations: Not on file    Relationship status: Not on file  . Intimate partner violence:    Fear of current or ex partner: Not on file    Emotionally abused: Not on file    Physically abused: Not on file    Forced sexual activity: Not on file  Other Topics Concern  . Not on file  Social History Narrative  . Not on file     BP 120/78 (BP Location: Left Arm)   Pulse (!) 56   Ht 5' 6.5" (1.689 m)   Wt 278 lb (126.1 kg)   SpO2 92%   BMI 44.20 kg/m    Physical Exam:  Well appearing 67 yo woman, NAD HEENT: Unremarkable Neck:  6 cm JVD, no thyromegally Lymphatics:  No adenopathy Back:  No CVA tenderness Lungs:  Clear with no wheezes HEART:  Regular rate rhythm, no murmurs, no rubs, no clicks Abd:  soft, positive bowel sounds, no organomegally, no rebound, no guarding Ext:  2 plus pulses, trace peripheral edema, no cyanosis, no clubbing Skin:  No rashes no nodules Neuro:  CN II through XII intact, motor grossly intact  EKG - nsr  Assess/Plan: 1. Atrial flutter - she is maintaining NSR since her ablation. I have asked her to stop her Eliquis.  2. HTN - her blood pressure is well controlled. Will follow. 3. Obesity - she needs to lose weight and I have asked her to lose weight. I am concerned about sleep apnea. I will defer outpatient sleep eval to Dr. [name].  Autumn Parrish.D.

## 2018-05-13 NOTE — Patient Instructions (Signed)
Medication Instructions:   Your physician has recommended you make the following change in your medication: Stop Taking Eliquis  Take Lasix 20 mg Daily    Labwork: NONE   Testing/Procedures: Your physician recommends that you continue on your current medications as directed. Please refer to the Current Medication list given to you today.   Follow-Up: Your physician wants you to follow-up in: 1 year with Dr. Lovena Le. You will receive a reminder letter in the mail two months in advance. If you don't receive a letter, please call our office to schedule the follow-up appointment.   Any Other Special Instructions Will Be Listed Below (If Applicable).     If you need a refill on your cardiac medications before your next appointment, please call your pharmacy. Thank you for choosing [facility]!

## 2018-06-02 ENCOUNTER — Encounter: Payer: Self-pay | Admitting: Cardiovascular Disease

## 2018-06-02 ENCOUNTER — Telehealth: Payer: Self-pay | Admitting: Cardiovascular Disease

## 2018-06-02 ENCOUNTER — Ambulatory Visit (INDEPENDENT_AMBULATORY_CARE_PROVIDER_SITE_OTHER): Payer: Medicare Other | Admitting: Cardiovascular Disease

## 2018-06-02 VITALS — BP 140/78 | HR 57 | Ht 67.0 in | Wt 280.0 lb

## 2018-06-02 DIAGNOSIS — R0602 Shortness of breath: Secondary | ICD-10-CM | POA: Diagnosis not present

## 2018-06-02 DIAGNOSIS — J449 Chronic obstructive pulmonary disease, unspecified: Secondary | ICD-10-CM | POA: Diagnosis not present

## 2018-06-02 DIAGNOSIS — R002 Palpitations: Secondary | ICD-10-CM

## 2018-06-02 DIAGNOSIS — I4892 Unspecified atrial flutter: Secondary | ICD-10-CM

## 2018-06-02 DIAGNOSIS — I483 Typical atrial flutter: Secondary | ICD-10-CM

## 2018-06-02 DIAGNOSIS — Z8679 Personal history of other diseases of the circulatory system: Secondary | ICD-10-CM | POA: Diagnosis not present

## 2018-06-02 DIAGNOSIS — I10 Essential (primary) hypertension: Secondary | ICD-10-CM | POA: Diagnosis not present

## 2018-06-02 DIAGNOSIS — Z9889 Other specified postprocedural states: Secondary | ICD-10-CM | POA: Diagnosis not present

## 2018-06-02 NOTE — Patient Instructions (Signed)
Medication Instructions:  Continue all current medications.  Labwork:  BNP - order given today.   Office will contact with results via phone or letter.    Testing/Procedures:  Your physician has requested that you have an echocardiogram. Echocardiography is a painless test that uses sound waves to create images of your heart. It provides your doctor with information about the size and shape of your heart and how well your heart's chambers and valves are working. This procedure takes approximately one hour. There are no restrictions for this procedure.  Your physician has recommended that you wear a 14 day event monitor. Event monitors are medical devices that record the heart's electrical activity. Doctors most often [place] these monitors to diagnose arrhythmias. Arrhythmias are problems with the speed or rhythm of the heartbeat. The monitor is a small, portable device. You can wear one while you do your normal daily activities. This is usually used to diagnose what is causing palpitations/syncope (passing out).  Office will contact with results via phone or letter.    Follow-Up: 6 weeks   Any Other Special Instructions Will Be Listed Below (If Applicable).  If you need a refill on your cardiac medications before your next appointment, please call your pharmacy.

## 2018-06-02 NOTE — Progress Notes (Signed)
SUBJECTIVE: The patient presents for routine follow-up.  She recently underwent radiofrequency ablation of atrial flutter. An echocardiogram performed in January 2019 reportedly demonstrated normal left ventricular systolic function, LVEF 93%. Additional past medical history includes COPD, hypothyroidism, and a history of tobacco abuse.  She was initially feeling well after ablation but for the past several weeks has had exertional dyspnea with minimal activity such as cleaning her apartment.  She also experiences intermittent palpitations although she says "it is not quite as fast nor as irregular ".  She very seldom has chest pains.  He denies leg swelling.  She denies audible wheezing.      Review of Systems: As per "subjective", otherwise negative.  Allergies  Allergen Reactions  . Nsaids Other (See Comments)    Upset stomach can tolerate ibuprofen     Current Outpatient Medications  Medication Sig Dispense Refill  . acetaminophen (TYLENOL) 500 MG tablet Take 1,000 mg by mouth every 6 (six) hours as needed for moderate pain or headache.    . amphetamine-dextroamphetamine (ADDERALL) 30 MG tablet Take 30 mg by mouth 2 (two) times daily.    Marland Kitchen diltiazem (CARDIZEM CD) 240 MG 24 hr capsule Take 240 mg by mouth daily.    . furosemide (LASIX) 20 MG tablet Take 1 tablet (20 mg total) by mouth daily. 90 tablet 3  . ibuprofen (ADVIL,MOTRIN) 800 MG tablet Take 800 mg by mouth every 8 (eight) hours as needed for headache or moderate pain.     Marland Kitchen levothyroxine (SYNTHROID, LEVOTHROID) 50 MCG tablet Take 50 mcg by mouth daily before breakfast.    . metoprolol tartrate (LOPRESSOR) 25 MG tablet Take 25 mg by mouth 2 (two) times daily.    . Multiple Vitamins-Minerals (CENTRUM ADULTS PO) Take 1 tablet by mouth daily.     Marland Kitchen oxybutynin (DITROPAN XL) 15 MG 24 hr tablet Take 15 mg by mouth daily.     . timolol (BETIMOL) 0.5 % ophthalmic solution Place 1 drop into both eyes daily.      No current  facility-administered medications for this visit.     Past Medical History:  Diagnosis Date  . ADD (attention deficit disorder)   . COPD (chronic obstructive pulmonary disease) ([place])   . Hypertension   . Hypothyroidism     Past Surgical History:  Procedure Laterality Date  . A-FLUTTER ABLATION N/A 04/08/2018   Procedure: A-FLUTTER ABLATION;  Surgeon: Evans Lance, MD;  Location: [facility];  Service: Cardiovascular;  Laterality: N/A;  . ABDOMINAL HYSTERECTOMY     age 58 d/t endometriosis  . KNEE ARTHROSCOPY Right   . NEUROMAL REMOVAL Right     Social History   Socioeconomic History  . Marital status: Divorced    Spouse name: Not on file  . Number of children: Not on file  . Years of education: Not on file  . Highest education level: Not on file  Occupational History  . Not on file  Social Needs  . Financial resource strain: Not on file  . Food insecurity:    Worry: Not on file    Inability: Not on file  . Transportation needs:    Medical: Not on file    Non-medical: Not on file  Tobacco Use  . Smoking status: Former Smoker    Types: Cigarettes    Last attempt to quit: 02/17/2013    Years since quitting: 5.2  . Smokeless tobacco: Never Used  Substance and Sexual Activity  .  Alcohol use: Not Currently  . Drug use: Not Currently  . Sexual activity: Not on file  Lifestyle  . Physical activity:    Days per week: Not on file    Minutes per session: Not on file  . Stress: Not on file  Relationships  . Social connections:    Talks on phone: Not on file    Gets together: Not on file    Attends religious service: Not on file    Active member of club or organization: Not on file    Attends meetings of clubs or organizations: Not on file    Relationship status: Not on file  . Intimate partner violence:    Fear of current or ex partner: Not on file    Emotionally abused: Not on file    Physically abused: Not on file    Forced sexual activity: Not on file    Other Topics Concern  . Not on file  Social History Narrative  . Not on file     Vitals:   06/02/18 1308  BP: 140/78  Pulse: (!) 57  SpO2: 94%  Weight: 280 lb (127 kg)  Height: 5\' 7"  (1.702 m)    Wt Readings from Last 3 Encounters:  06/02/18 280 lb (127 kg)  05/13/18 278 lb (126.1 kg)  04/08/18 285 lb (129.3 kg)     PHYSICAL EXAM General: NAD HEENT: Normal. Neck: No JVD, no thyromegaly. Lungs: Clear to auscultation bilaterally with normal respiratory effort. CV: Regular rate and rhythm, normal S1/S2, no S3/S4, no murmur. No pretibial or periankle edema.    Abdomen: Soft, nontender, no distention.  Neurologic: Alert and oriented.  Psych: Normal affect. Skin: Normal. Musculoskeletal: No gross deformities.    ECG: Most recent ECG reviewed.   Labs: Lab Results  Component Value Date/Time   K 4.3 04/06/2018 10:38 AM   BUN 20 04/06/2018 10:38 AM   CREATININE 0.82 04/06/2018 10:38 AM   HGB 14.4 04/06/2018 10:38 AM     Lipids: No results found for: LDLCALC, LDLDIRECT, CHOL, TRIG, HDL     ASSESSMENT AND PLAN:  1.  Rapid atrial flutter status post radiofrequency ablation with palpitations: She is on long-acting diltiazem 240 mg daily and metoprolol tartrate 25 mg twice daily.  She is mildly bradycardic.  EP has already stopped Eliquis. I will obtain a 2-week event monitor to assess for recurrent tachyarrhythmia.  2.  Hypertension: Mildly elevated.  Will monitor.  3.  COPD with shortness of breath: No audible wheezing.  She quit smoking several years ago.  I may consider pulmonary function testing.  See discussion and #4.  4.  Shortness of breath: No obvious abnormalities on physical exam.  She does have COPD but denies wheezing.  I will obtain a BNP and an echocardiogram to evaluate for interval changes in cardiac structure and function.  If this is unrevealing, I may consider pulmonary function testing.   Disposition: Follow up 6 weeks  A high level of  decision making was required for increased medical complexities.    Kate Sable, M.D., F.A.C.C.

## 2018-06-02 NOTE — Telephone Encounter (Signed)
Pre-cert Verification for the following procedure   14 day event monitor  Echo scheduled 06/16/2018

## 2018-06-04 DIAGNOSIS — R0602 Shortness of breath: Secondary | ICD-10-CM | POA: Diagnosis not present

## 2018-06-05 ENCOUNTER — Telehealth: Payer: Self-pay | Admitting: *Deleted

## 2018-06-05 LAB — BRAIN NATRIURETIC PEPTIDE: BNP: 31.2 pg/mL (ref 0.0–100.0)

## 2018-06-05 NOTE — Telephone Encounter (Signed)
Called pt, no answer, unable to leave msg.  

## 2018-06-05 NOTE — Telephone Encounter (Signed)
-----   Message from Herminio Commons, MD sent at 06/05/2018  1:58 PM EDT ----- normal

## 2018-06-08 ENCOUNTER — Telehealth: Payer: Self-pay | Admitting: Cardiovascular Disease

## 2018-06-08 NOTE — Telephone Encounter (Signed)
Patient's mom notified.

## 2018-06-08 NOTE — Telephone Encounter (Signed)
Would like to know when she is suppose to get monitor through the mail

## 2018-06-10 ENCOUNTER — Ambulatory Visit (INDEPENDENT_AMBULATORY_CARE_PROVIDER_SITE_OTHER): Payer: Medicare Other

## 2018-06-10 DIAGNOSIS — R002 Palpitations: Secondary | ICD-10-CM

## 2018-06-14 ENCOUNTER — Telehealth: Payer: Self-pay | Admitting: Internal Medicine

## 2018-06-14 NOTE — Telephone Encounter (Signed)
Cardiology [facility] Note  Received page from [facility]. ECG recording w/ 6 seconds of wide complex tachycardia. Pt wearing rhythm monitor therefore unable to differentiate between SVT w/ aberrant conduction and NSVT. Rate ~180 bpm. Underlying rhythm sinus bradycardia. Patient asymptomatic, sleeping. No other arrhythmias documented by rhythm monitor. This information will be sent to patient's outpatient cardiologists.   Marcie Mowers, MD Cardiology Fellow, PGY-6

## 2018-06-15 NOTE — Telephone Encounter (Signed)
Strips faxed to [facility] office for MD review.

## 2018-06-15 NOTE — Telephone Encounter (Signed)
Edd Fabian, can we get rhythm strips? Also forward to Dr. Lovena Le.

## 2018-06-16 ENCOUNTER — Other Ambulatory Visit: Payer: Self-pay

## 2018-06-16 ENCOUNTER — Ambulatory Visit (INDEPENDENT_AMBULATORY_CARE_PROVIDER_SITE_OTHER): Payer: Medicare Other

## 2018-06-16 DIAGNOSIS — R0602 Shortness of breath: Secondary | ICD-10-CM | POA: Diagnosis not present

## 2018-06-16 NOTE — Telephone Encounter (Signed)
I would repeat the echo. I did not see one from June. I had a hard time interpreting the one from January. If EF is good, I would not do anything different other than reassuranced. I can see her back in a few weeks. If EF is down, then I can discuss additional treatment.  GT

## 2018-06-16 NOTE — Telephone Encounter (Signed)
Strips received.

## 2018-06-18 ENCOUNTER — Telehealth: Payer: Self-pay | Admitting: *Deleted

## 2018-06-18 NOTE — Addendum Note (Signed)
Addended by: Levonne Hubert on: 06/18/2018 11:38 AM   Modules accepted: Orders

## 2018-06-18 NOTE — Telephone Encounter (Signed)
Notes recorded by Laurine Blazer, LPN on 8/67/7373 at 6:68 PM EDT Mother notified. Copy to pmd. Follow up scheduled for 08/06/2018 with Dr. Bronson Ing. ------  Notes recorded by Laurine Blazer, LPN on 1/59/4707 at 6:15 PM EDT Left message for mother Cherri Yera) to return call. ------  Notes recorded by Herminio Commons, MD on 06/17/2018 at 9:54 AM EDT Normal pumping function, mild stiffness with heart relaxation which is normal with age, small fluid collection around the tip of the heart which is insignificant.

## 2018-06-18 NOTE — Addendum Note (Signed)
Addended by: Levonne Hubert on: 06/18/2018 11:41 AM   Modules accepted: Orders

## 2018-06-24 DIAGNOSIS — Z23 Encounter for immunization: Secondary | ICD-10-CM | POA: Diagnosis not present

## 2018-06-24 DIAGNOSIS — Z6841 Body Mass Index (BMI) 40.0 and over, adult: Secondary | ICD-10-CM | POA: Diagnosis not present

## 2018-06-24 DIAGNOSIS — Z1389 Encounter for screening for other disorder: Secondary | ICD-10-CM | POA: Diagnosis not present

## 2018-06-24 DIAGNOSIS — F909 Attention-deficit hyperactivity disorder, unspecified type: Secondary | ICD-10-CM | POA: Diagnosis not present

## 2018-07-07 DIAGNOSIS — I739 Peripheral vascular disease, unspecified: Secondary | ICD-10-CM | POA: Diagnosis not present

## 2018-07-28 DIAGNOSIS — Z1231 Encounter for screening mammogram for malignant neoplasm of breast: Secondary | ICD-10-CM | POA: Diagnosis not present

## 2018-08-06 ENCOUNTER — Ambulatory Visit: Payer: Medicare Other | Admitting: Cardiovascular Disease

## 2018-08-17 ENCOUNTER — Encounter: Payer: Self-pay | Admitting: Cardiovascular Disease

## 2018-08-17 ENCOUNTER — Ambulatory Visit (INDEPENDENT_AMBULATORY_CARE_PROVIDER_SITE_OTHER): Payer: Medicare Other | Admitting: Cardiovascular Disease

## 2018-08-17 VITALS — BP 128/64 | HR 55 | Ht 67.0 in | Wt 288.0 lb

## 2018-08-17 DIAGNOSIS — Z9889 Other specified postprocedural states: Secondary | ICD-10-CM

## 2018-08-17 DIAGNOSIS — R002 Palpitations: Secondary | ICD-10-CM

## 2018-08-17 DIAGNOSIS — I313 Pericardial effusion (noninflammatory): Secondary | ICD-10-CM | POA: Diagnosis not present

## 2018-08-17 DIAGNOSIS — Z8679 Personal history of other diseases of the circulatory system: Secondary | ICD-10-CM | POA: Diagnosis not present

## 2018-08-17 DIAGNOSIS — I10 Essential (primary) hypertension: Secondary | ICD-10-CM

## 2018-08-17 DIAGNOSIS — I3139 Other pericardial effusion (noninflammatory): Secondary | ICD-10-CM

## 2018-08-17 DIAGNOSIS — Z23 Encounter for immunization: Secondary | ICD-10-CM | POA: Diagnosis not present

## 2018-08-17 NOTE — Progress Notes (Signed)
SUBJECTIVE: The patient presents for routine follow-up.  She has a history of ablation of atrial flutter and follows with EP.  Event monitoring demonstrated predominantly sinus rhythm with sinus arrhythmia.  There was one 15 beat run of a wide-complex tachycardia. Echocardiogram 06/16/2018: Normal left ventricular systolic function and regional wall motion, LVEF 60 to 65%, mild LVH, grade 1 diastolic dysfunction, small apical pericardial effusion.  She denies chest pain.  Chronic exertional dyspnea is stable.  She does have COPD.  She seldom has palpitations.     Review of Systems: As per "subjective", otherwise negative.  Allergies  Allergen Reactions  . Nsaids Other (See Comments)    Upset stomach can tolerate ibuprofen     Current Outpatient Medications  Medication Sig Dispense Refill  . acetaminophen (TYLENOL) 500 MG tablet Take 1,000 mg by mouth every 6 (six) hours as needed for moderate pain or headache.    . amphetamine-dextroamphetamine (ADDERALL) 30 MG tablet Take 30 mg by mouth 2 (two) times daily.    Marland Kitchen diltiazem (CARDIZEM CD) 240 MG 24 hr capsule Take 240 mg by mouth daily.    . furosemide (LASIX) 20 MG tablet Take 1 tablet (20 mg total) by mouth daily. 90 tablet 3  . ibuprofen (ADVIL,MOTRIN) 800 MG tablet Take 800 mg by mouth every 8 (eight) hours as needed for headache or moderate pain.     Marland Kitchen levothyroxine (SYNTHROID, LEVOTHROID) 50 MCG tablet Take 50 mcg by mouth daily before breakfast.    . metoprolol tartrate (LOPRESSOR) 25 MG tablet Take 25 mg by mouth 2 (two) times daily.    . Multiple Vitamins-Minerals (CENTRUM ADULTS PO) Take 1 tablet by mouth daily.     Marland Kitchen oxybutynin (DITROPAN XL) 15 MG 24 hr tablet Take 15 mg by mouth daily.     . timolol (BETIMOL) 0.5 % ophthalmic solution Place 1 drop into both eyes daily.      No current facility-administered medications for this visit.     Past Medical History:  Diagnosis Date  . ADD (attention deficit disorder)   .  COPD (chronic obstructive pulmonary disease) ([place])   . Hypertension   . Hypothyroidism     Past Surgical History:  Procedure Laterality Date  . A-FLUTTER ABLATION N/A 04/08/2018   Procedure: A-FLUTTER ABLATION;  Surgeon: Evans Lance, MD;  Location: [facility];  Service: Cardiovascular;  Laterality: N/A;  . ABDOMINAL HYSTERECTOMY     age 48 d/t endometriosis  . KNEE ARTHROSCOPY Right   . NEUROMAL REMOVAL Right     Social History   Socioeconomic History  . Marital status: Divorced    Spouse name: Not on file  . Number of children: Not on file  . Years of education: Not on file  . Highest education level: Not on file  Occupational History  . Not on file  Social Needs  . Financial resource strain: Not on file  . Food insecurity:    Worry: Not on file    Inability: Not on file  . Transportation needs:    Medical: Not on file    Non-medical: Not on file  Tobacco Use  . Smoking status: Former Smoker    Types: Cigarettes    Last attempt to quit: 02/17/2013    Years since quitting: 5.4  . Smokeless tobacco: Never Used  Substance and Sexual Activity  . Alcohol use: Not Currently  . Drug use: Not Currently  . Sexual activity: Not on file  Lifestyle  .  Physical activity:    Days per week: Not on file    Minutes per session: Not on file  . Stress: Not on file  Relationships  . Social connections:    Talks on phone: Not on file    Gets together: Not on file    Attends religious service: Not on file    Active member of club or organization: Not on file    Attends meetings of clubs or organizations: Not on file    Relationship status: Not on file  . Intimate partner violence:    Fear of current or ex partner: Not on file    Emotionally abused: Not on file    Physically abused: Not on file    Forced sexual activity: Not on file  Other Topics Concern  . Not on file  Social History Narrative  . Not on file     Vitals:   08/17/18 0913  BP: 128/64  Pulse:  (!) 55  SpO2: 94%  Weight: 288 lb (130.6 kg)  Height: 5\' 7"  (1.702 m)    Wt Readings from Last 3 Encounters:  08/17/18 288 lb (130.6 kg)  06/02/18 280 lb (127 kg)  05/13/18 278 lb (126.1 kg)     PHYSICAL EXAM General: NAD HEENT: Normal. Neck: No JVD, no thyromegaly. Lungs: Clear to auscultation bilaterally with normal respiratory effort. CV: Regular rate and rhythm, normal S1/S2, no S3/S4, no murmur. No pretibial or periankle edema.  No carotid bruit.   Abdomen: Soft, nontender, no distention.  Neurologic: Alert and oriented.  Psych: Normal affect. Skin: Normal. Musculoskeletal: No gross deformities.    ECG: Reviewed above under Subjective   Labs: Lab Results  Component Value Date/Time   K 4.3 04/06/2018 10:38 AM   BUN 20 04/06/2018 10:38 AM   CREATININE 0.82 04/06/2018 10:38 AM   HGB 14.4 04/06/2018 10:38 AM     Lipids: No results found for: LDLCALC, LDLDIRECT, CHOL, TRIG, HDL     ASSESSMENT AND PLAN: 1.  Atrial flutter status post ablation: Symptomatically stable.  Continue long-acting diltiazem 240 mg daily and metoprolol tartrate 25 mg twice daily.  2.  Hypertension: Blood pressure is normal.  No changes to therapy.  3.  Pericardial effusion: This is small and located around the apex.  There is no hemodynamic compromise.    Disposition: Follow up with EP as scheduled.  Follow-up with me as needed.   Kate Sable, M.D., F.A.C.C.

## 2018-08-17 NOTE — Patient Instructions (Signed)
Medication Instructions:  Continue all current medications.  Labwork: none  Testing/Procedures: none  Follow-Up: As needed.    Any Other Special Instructions Will Be Listed Below (If Applicable).  If you need a refill on your cardiac medications before your next appointment, please call your pharmacy.  

## 2018-08-24 DIAGNOSIS — L218 Other seborrheic dermatitis: Secondary | ICD-10-CM | POA: Diagnosis not present

## 2018-08-24 DIAGNOSIS — I872 Venous insufficiency (chronic) (peripheral): Secondary | ICD-10-CM | POA: Diagnosis not present

## 2018-08-24 DIAGNOSIS — L820 Inflamed seborrheic keratosis: Secondary | ICD-10-CM | POA: Diagnosis not present

## 2018-08-26 DIAGNOSIS — H40013 Open angle with borderline findings, low risk, bilateral: Secondary | ICD-10-CM | POA: Diagnosis not present

## 2018-09-24 DIAGNOSIS — H401131 Primary open-angle glaucoma, bilateral, mild stage: Secondary | ICD-10-CM | POA: Diagnosis not present

## 2018-09-29 DIAGNOSIS — I4891 Unspecified atrial fibrillation: Secondary | ICD-10-CM | POA: Diagnosis not present

## 2018-09-29 DIAGNOSIS — J449 Chronic obstructive pulmonary disease, unspecified: Secondary | ICD-10-CM | POA: Diagnosis not present

## 2018-09-29 DIAGNOSIS — Z6841 Body Mass Index (BMI) 40.0 and over, adult: Secondary | ICD-10-CM | POA: Diagnosis not present

## 2018-09-29 DIAGNOSIS — E782 Mixed hyperlipidemia: Secondary | ICD-10-CM | POA: Diagnosis not present

## 2018-09-29 DIAGNOSIS — R7301 Impaired fasting glucose: Secondary | ICD-10-CM | POA: Diagnosis not present

## 2018-09-29 DIAGNOSIS — I10 Essential (primary) hypertension: Secondary | ICD-10-CM | POA: Diagnosis not present

## 2018-09-29 DIAGNOSIS — E669 Obesity, unspecified: Secondary | ICD-10-CM | POA: Diagnosis not present

## 2018-09-29 DIAGNOSIS — G40909 Epilepsy, unspecified, not intractable, without status epilepticus: Secondary | ICD-10-CM | POA: Diagnosis not present

## 2018-09-29 DIAGNOSIS — E039 Hypothyroidism, unspecified: Secondary | ICD-10-CM | POA: Diagnosis not present

## 2018-09-29 DIAGNOSIS — E559 Vitamin D deficiency, unspecified: Secondary | ICD-10-CM | POA: Diagnosis not present

## 2018-10-06 DIAGNOSIS — I739 Peripheral vascular disease, unspecified: Secondary | ICD-10-CM | POA: Diagnosis not present

## 2018-10-27 DIAGNOSIS — H40013 Open angle with borderline findings, low risk, bilateral: Secondary | ICD-10-CM | POA: Diagnosis not present

## 2018-12-18 DIAGNOSIS — F909 Attention-deficit hyperactivity disorder, unspecified type: Secondary | ICD-10-CM | POA: Diagnosis not present

## 2018-12-18 DIAGNOSIS — L0100 Impetigo, unspecified: Secondary | ICD-10-CM | POA: Diagnosis not present

## 2018-12-18 DIAGNOSIS — L259 Unspecified contact dermatitis, unspecified cause: Secondary | ICD-10-CM | POA: Diagnosis not present

## 2018-12-18 DIAGNOSIS — Z6841 Body Mass Index (BMI) 40.0 and over, adult: Secondary | ICD-10-CM | POA: Diagnosis not present

## 2019-01-05 DIAGNOSIS — I739 Peripheral vascular disease, unspecified: Secondary | ICD-10-CM | POA: Diagnosis not present

## 2019-01-12 DIAGNOSIS — L438 Other lichen planus: Secondary | ICD-10-CM | POA: Diagnosis not present

## 2019-01-12 DIAGNOSIS — L308 Other specified dermatitis: Secondary | ICD-10-CM | POA: Diagnosis not present

## 2019-01-12 DIAGNOSIS — L309 Dermatitis, unspecified: Secondary | ICD-10-CM | POA: Diagnosis not present

## 2019-08-18 ENCOUNTER — Other Ambulatory Visit (HOSPITAL_COMMUNITY): Payer: Self-pay | Admitting: Gerontology

## 2019-08-18 ENCOUNTER — Other Ambulatory Visit: Payer: Self-pay | Admitting: Gerontology

## 2019-08-18 ENCOUNTER — Other Ambulatory Visit: Payer: Self-pay | Admitting: Obstetrics and Gynecology

## 2019-08-18 DIAGNOSIS — Z13820 Encounter for screening for osteoporosis: Secondary | ICD-10-CM

## 2019-08-18 DIAGNOSIS — Z1231 Encounter for screening mammogram for malignant neoplasm of breast: Secondary | ICD-10-CM

## 2019-08-23 ENCOUNTER — Encounter: Payer: Self-pay | Admitting: Gastroenterology

## 2019-09-06 ENCOUNTER — Other Ambulatory Visit (HOSPITAL_COMMUNITY): Payer: Self-pay | Admitting: Gerontology

## 2019-09-06 DIAGNOSIS — I739 Peripheral vascular disease, unspecified: Secondary | ICD-10-CM

## 2019-09-07 ENCOUNTER — Other Ambulatory Visit: Payer: Self-pay

## 2019-09-07 ENCOUNTER — Ambulatory Visit (HOSPITAL_COMMUNITY)
Admission: RE | Admit: 2019-09-07 | Discharge: 2019-09-07 | Disposition: A | Discharge status: Home / Self Care | Payer: Medicare (Managed Care) | Source: Ambulatory Visit | Attending: Family | Admitting: Family

## 2019-09-07 DIAGNOSIS — I739 Peripheral vascular disease, unspecified: Secondary | ICD-10-CM | POA: Diagnosis not present

## 2019-09-23 ENCOUNTER — Encounter: Payer: Medicare Other | Admitting: Gastroenterology

## 2019-10-28 ENCOUNTER — Other Ambulatory Visit: Payer: Medicare Other

## 2019-10-28 ENCOUNTER — Ambulatory Visit: Payer: Medicare Other

## 2019-12-15 ENCOUNTER — Ambulatory Visit: Payer: Medicare (Managed Care) | Admitting: Podiatry

## 2019-12-20 ENCOUNTER — Telehealth: Payer: Self-pay | Admitting: *Deleted

## 2019-12-20 NOTE — Telephone Encounter (Signed)
Left message informing [facility] Autumn Parrish had cancelled the 12/15/2019 appt and was scheduled again later in 12/2019, to call with concerns.

## 2019-12-20 NOTE — Telephone Encounter (Signed)
[facility] asked status of 12/15/2019 appt. I reviewed appt date and pt cancelled 12/15/2019.

## 2019-12-27 ENCOUNTER — Ambulatory Visit: Payer: Medicare (Managed Care) | Admitting: Podiatry

## 2019-12-28 ENCOUNTER — Ambulatory Visit (INDEPENDENT_AMBULATORY_CARE_PROVIDER_SITE_OTHER): Payer: Medicare (Managed Care) | Admitting: Podiatry

## 2019-12-28 ENCOUNTER — Encounter: Payer: Self-pay | Admitting: Podiatry

## 2019-12-28 ENCOUNTER — Other Ambulatory Visit: Payer: Self-pay

## 2019-12-28 DIAGNOSIS — B351 Tinea unguium: Secondary | ICD-10-CM | POA: Diagnosis not present

## 2019-12-28 DIAGNOSIS — M79675 Pain in left toe(s): Secondary | ICD-10-CM

## 2019-12-28 DIAGNOSIS — S91114A Laceration without foreign body of right lesser toe(s) without damage to nail, initial encounter: Secondary | ICD-10-CM | POA: Diagnosis not present

## 2019-12-28 DIAGNOSIS — S91119A Laceration without foreign body of unspecified toe without damage to nail, initial encounter: Secondary | ICD-10-CM | POA: Insufficient documentation

## 2019-12-28 DIAGNOSIS — M79674 Pain in right toe(s): Secondary | ICD-10-CM

## 2019-12-28 NOTE — Progress Notes (Signed)
This patient presents to the office stating that she is having pain at the site of the second  toenail  left foot and  third and fourth toenail  right foot.  She says approximately 1-1/2 weeks ago the nurse at [facility] tried to cut her nails.  She says the the nurse cut too deeply and  started having pain at the second left and the third and fourth toenails right foot.  She said there was bleeding that occurred following the nurse cutting her toe nails.  She says she began soaking her toes a few days ago and bandaging her toes. a few days ago.  She says she has pain walking in her shoes and pain has not improved.  She presents to the office for evaluation and treatment.  General Appearance  Alert, conversant and in no acute stress.  Vascular  Dorsalis pedis  pulses are palpable  bilaterally  .Posterior tibial pulses are absent due to swelling.  Capillary return is within normal limits  bilaterally. Temperature is within normal limits  bilaterally.  Neurologic  Senn-Weinstein monofilament wire test within normal limits  bilaterally. Muscle power within normal limits bilaterally.  Nails Thick disfigured discolored nails with subungual debris  from hallux to fifth toes bilaterally. No evidence of bacterial infection or drainage bilaterally.  Orthopedic  No limitations of motion  feet .  No crepitus or effusions noted.  No bony pathology or digital deformities noted. Hammer toes/mallet toes  B/L.  Skin  normotropic skin with no porokeratosis noted bilaterally.  No signs of infections or ulcers noted.  Healing laceration 3rd toe right foot.  Callus noted hallux right foot.  Onychomycosis  Healing laceration 3rd toe right foot.  IE.  Debride nails  X 10.  Her third toe is healing at the site of the laceration.  No drainage or infection is noted.  Patient was told that she developed a distal clavi on multiple toes which ultimately led to laceration third toe right foot.  Patient was instructed to return to the  office in 3 months for further evaluation and treatment.  After treatment of toenails she left with no pain.  RTC 3 months.

## 2019-12-29 ENCOUNTER — Ambulatory Visit: Payer: Medicare (Managed Care) | Admitting: Podiatry

## 2020-01-10 ENCOUNTER — Ambulatory Visit: Payer: Medicare (Managed Care) | Admitting: Podiatry

## 2020-01-21 ENCOUNTER — Other Ambulatory Visit: Payer: Self-pay

## 2020-01-21 ENCOUNTER — Ambulatory Visit
Admission: RE | Admit: 2020-01-21 | Discharge: 2020-01-21 | Disposition: A | Discharge status: Home / Self Care | Payer: Medicare (Managed Care) | Source: Ambulatory Visit | Attending: Gerontology | Admitting: Gerontology

## 2020-01-21 DIAGNOSIS — Z1231 Encounter for screening mammogram for malignant neoplasm of breast: Secondary | ICD-10-CM

## 2020-01-21 DIAGNOSIS — Z13820 Encounter for screening for osteoporosis: Secondary | ICD-10-CM

## 2020-01-21 IMAGING — MG DIGITAL SCREENING BILAT W/ TOMO W/ CAD
6 of 10 series · 6 of 30 positions shown · non-contrast
Comparison: Previous exam(s).

CLINICAL DATA: Screening.

EXAM:
DIGITAL SCREENING BILATERAL MAMMOGRAM WITH TOMO AND CAD

[L CC synth-2D (1 of 2)]
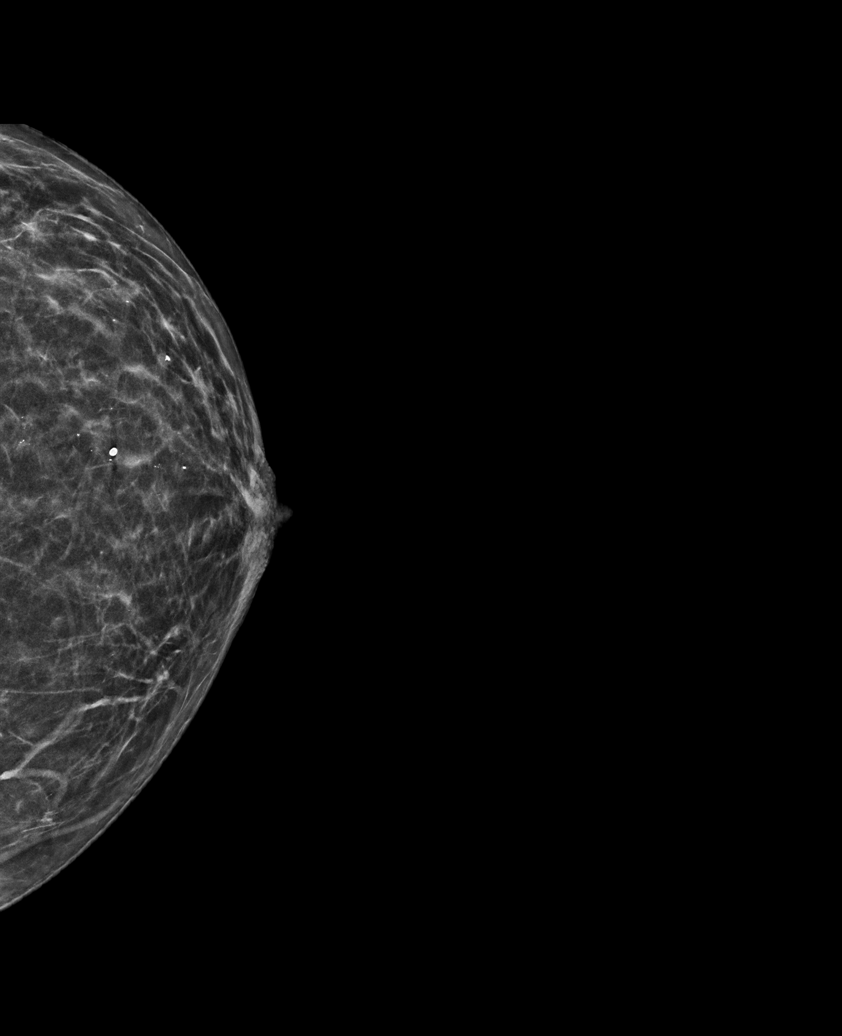

[L MLO synth-2D]
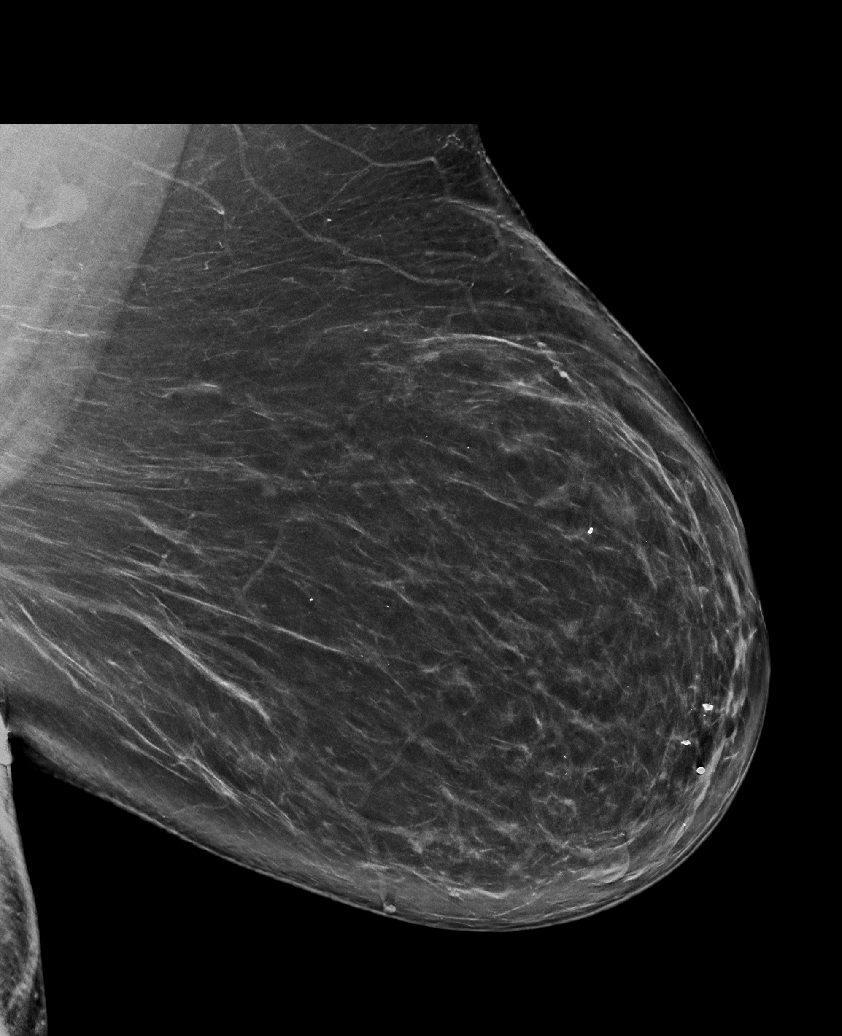

[R CC synth-2D]
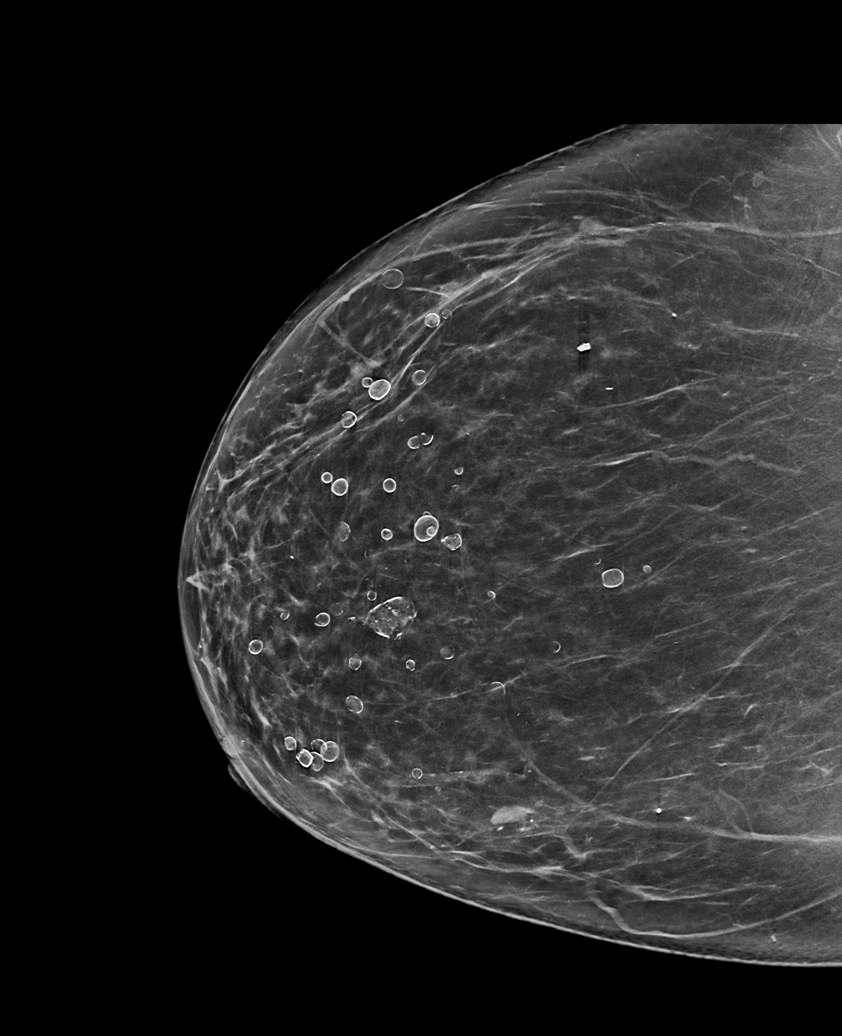

[L CC synth-2D (2 of 2)]
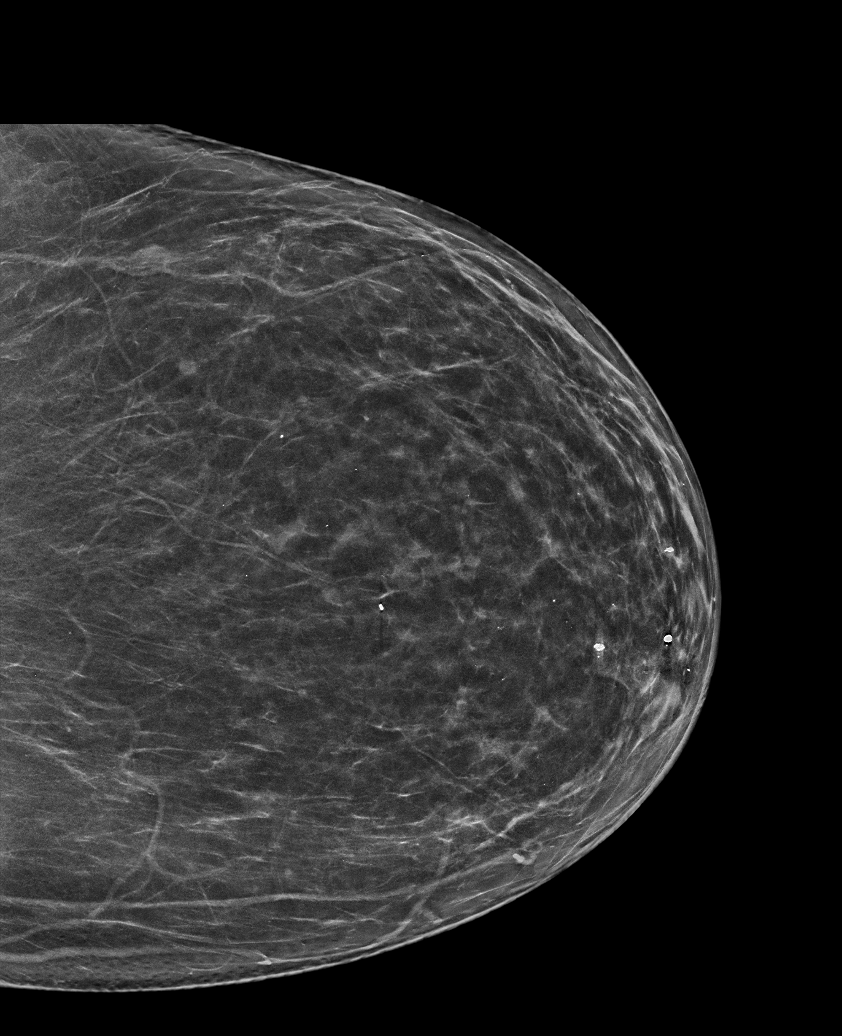

[R MLO synth-2D]
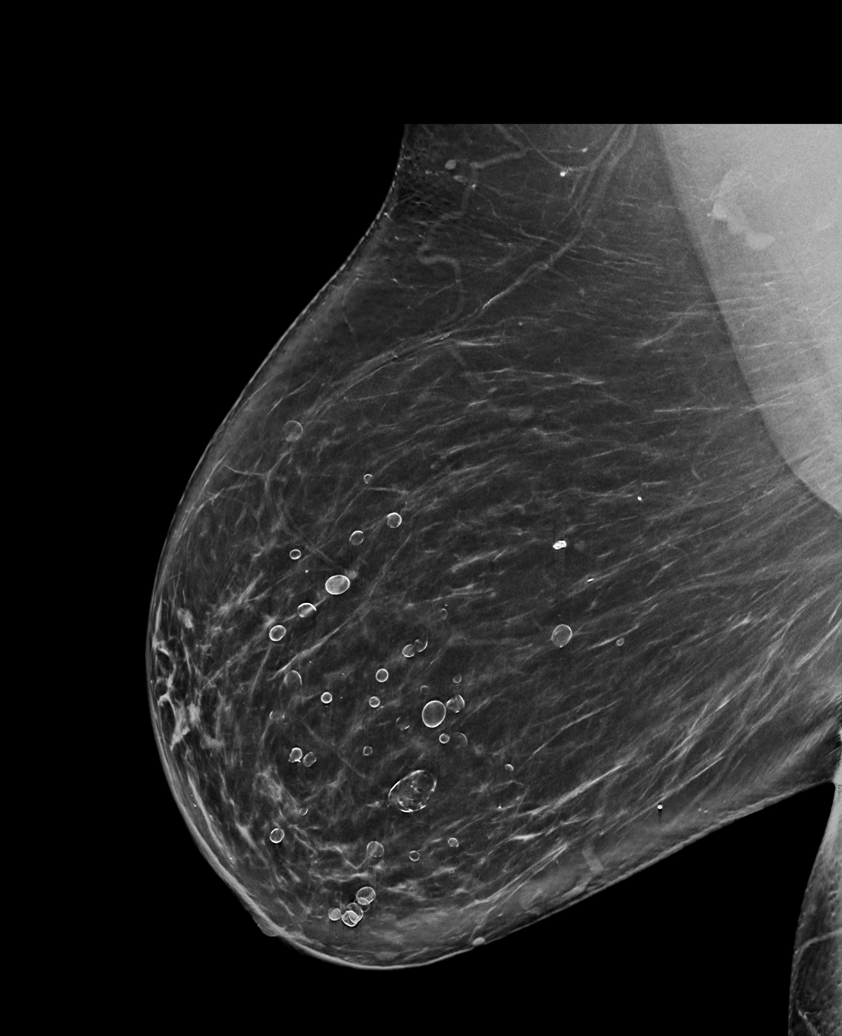

[R CC tomo · tomo slice 41/81.0]
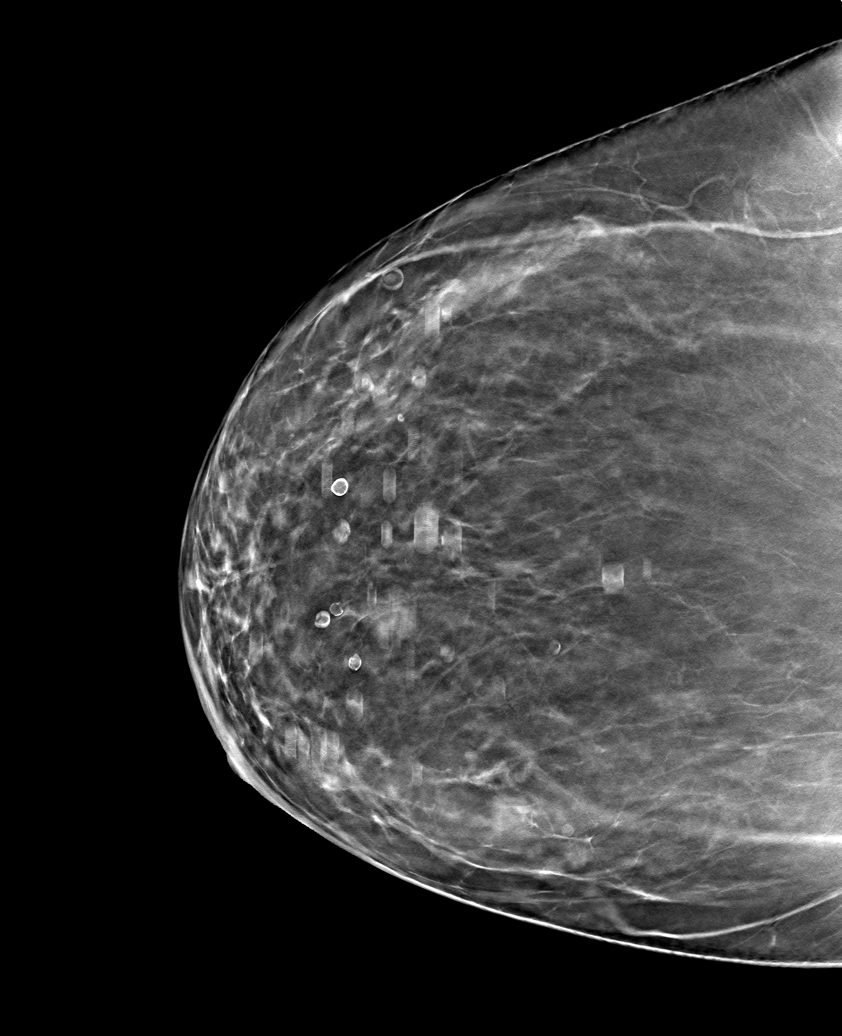

[6 of 30 positions shown; findings below may reference images not displayed]

ACR Breast Density Category b: There are scattered areas of
fibroglandular density.
FINDINGS: There are no findings suspicious for malignancy. Images were
processed with CAD.
IMPRESSION: No mammographic evidence of malignancy. A result letter of this
screening mammogram will be mailed directly to the patient.

RECOMMENDATION:
Screening mammogram in one year. (Code:[TQ])

BI-RADS CATEGORY  1: Negative.

## 2020-02-04 ENCOUNTER — Ambulatory Visit
Admission: RE | Admit: 2020-02-04 | Discharge: 2020-02-04 | Disposition: A | Discharge status: Home / Self Care | Payer: Medicare (Managed Care) | Source: Ambulatory Visit | Attending: Gerontology | Admitting: Gerontology

## 2020-02-04 ENCOUNTER — Other Ambulatory Visit: Payer: Self-pay | Admitting: Gerontology

## 2020-02-04 DIAGNOSIS — T148XXA Other injury of unspecified body region, initial encounter: Secondary | ICD-10-CM

## 2020-02-04 DIAGNOSIS — W19XXXA Unspecified fall, initial encounter: Secondary | ICD-10-CM

## 2020-03-28 ENCOUNTER — Ambulatory Visit: Payer: Medicare (Managed Care) | Admitting: Podiatry

## 2020-03-29 ENCOUNTER — Encounter: Payer: Self-pay | Admitting: Podiatry

## 2020-03-29 ENCOUNTER — Ambulatory Visit (INDEPENDENT_AMBULATORY_CARE_PROVIDER_SITE_OTHER): Payer: Medicare (Managed Care) | Admitting: Podiatry

## 2020-03-29 ENCOUNTER — Ambulatory Visit (INDEPENDENT_AMBULATORY_CARE_PROVIDER_SITE_OTHER): Payer: Medicare (Managed Care)

## 2020-03-29 ENCOUNTER — Other Ambulatory Visit: Payer: Self-pay

## 2020-03-29 VITALS — Temp 97.2°F

## 2020-03-29 DIAGNOSIS — T148XXA Other injury of unspecified body region, initial encounter: Secondary | ICD-10-CM

## 2020-03-29 DIAGNOSIS — M7752 Other enthesopathy of left foot: Secondary | ICD-10-CM

## 2020-03-29 DIAGNOSIS — L84 Corns and callosities: Secondary | ICD-10-CM

## 2020-03-29 DIAGNOSIS — M79675 Pain in left toe(s): Secondary | ICD-10-CM | POA: Diagnosis not present

## 2020-03-29 DIAGNOSIS — M79674 Pain in right toe(s): Secondary | ICD-10-CM

## 2020-03-29 DIAGNOSIS — B351 Tinea unguium: Secondary | ICD-10-CM | POA: Diagnosis not present

## 2020-03-29 NOTE — Progress Notes (Signed)
Complaint:  Visit Type: Patient returns to my office for continued preventative foot care services. Complaint: Patient states" my nails have grown long and thick and become painful to walk and wear shoes"   She has also developed a callus along the inside border right big toe.  She also says she is having severe pain in her left foot extending from the top of her left foot into her ankle.  She denies history of trauma or injury  Patient states there is severe pain on weight bearing.  Patient says she has been taking acetominophen and advil for pain.    Patient has severe swelling legs  B/L.   The patient presents for preventative foot care services as well as foot/ankle pain.  Podiatric Exam: Vascular: dorsalis pedis  are palpable bilateral.  Posterior tibial pulses are absent due to swelling.   Capillary return is immediate. Temperature gradient is WNL. Skin turgor WNL  Sensorium: Normal Semmes Weinstein monofilament test. Normal tactile sensation bilaterally. Nail Exam: Pt has thick disfigured discolored nails with subungual debris noted bilateral entire nail hallux through fifth toenails Ulcer Exam: There is no evidence of ulcer or pre-ulcerative changes or infection. Orthopedic Exam: Muscle tone and strength are WNL. No limitations in general ROM. No crepitus or effusions noted. Hammer toes and mallet toes  B/L.   HAV  B/L.  Palpable pain noted over the navicular dorsally left foot.  No evidence of any redness or swelling noted over the navicular.  No palpable pain over the left ankle. Skin: No Porokeratosis. No infection or ulcers.  Callus/fissures right hallux.  Diagnosis:  Onychomycosis, , Pain in right toe, pain in left toes  Treatment & Plan Procedures and Treatment: Consent by patient was obtained for treatment procedures.   Debridement of mycotic and hypertrophic toenails, 1 through 5 bilateral and clearing of subungual debris. No ulceration, no infection noted.  Debridement of callus  /fissures right hallux.  X-rays taken reveal defect dorsally navicular on lateral x-ray.  Discussed this pain in left foot with this patient.  Since she has stomach problems with NSAIDS no NSAID were prescribed.  Dispense cam walker and taught patient to walk in the cam walker.  RTC 10-14 days.  Call if problem persists.  To consider medrol dosepak in future if pain persists.  Told her to rest her foot and use ice/heat  at painful left foot site.Told to use vaseline for fissures right hallux.   Return Visit-Office Procedure: Patient instructed to return to the office for a follow up visit 3 months for continued evaluation and treatment.    Gardiner Barefoot DPM

## 2020-04-05 ENCOUNTER — Ambulatory Visit: Payer: Medicare (Managed Care) | Admitting: Podiatry

## 2020-04-26 ENCOUNTER — Ambulatory Visit: Payer: Medicare (Managed Care) | Admitting: Podiatry

## 2020-05-02 ENCOUNTER — Ambulatory Visit: Payer: Medicare (Managed Care) | Admitting: Podiatry

## 2020-05-05 ENCOUNTER — Other Ambulatory Visit: Payer: Self-pay

## 2020-05-05 ENCOUNTER — Ambulatory Visit (INDEPENDENT_AMBULATORY_CARE_PROVIDER_SITE_OTHER): Payer: Medicare (Managed Care) | Admitting: Podiatry

## 2020-05-05 VITALS — Temp 95.7°F

## 2020-05-05 DIAGNOSIS — M19072 Primary osteoarthritis, left ankle and foot: Secondary | ICD-10-CM | POA: Diagnosis not present

## 2020-05-05 MED ORDER — IBUPROFEN 800 MG PO TABS: 800.0000 mg | ORAL_TABLET | Freq: Three times a day (TID) | ORAL | 0 refills | Status: DC | PRN

## 2020-05-26 ENCOUNTER — Ambulatory Visit: Payer: Medicare (Managed Care) | Admitting: Podiatry

## 2020-05-28 NOTE — Progress Notes (Signed)
  Subjective:  Patient ID: Autumn Parrish, female    DOB: 28-May-1951,  MRN: [id number]  Chief Complaint  Patient presents with  . Follow-up    L ankle tendonitis. Pt stated, "Pain = 8/10. I tried wearing the CAM boot, but it only made the pain worse. Tylenol and ibuprofen are not helping".   69 y.o. female presents with the above complaint. History confirmed with patient.   Objective:  Physical Exam: warm, good capillary refill, no trophic changes or ulcerative lesions, normal DP and PT pulses and normal sensory exam. Left Foot: POP 2nd TMT     Assessment:   1. Arthritis of midtarsal joint of left foot      Plan:  Patient was evaluated and treated and all questions answered.  Midfoot arthritis -Injection delivered as below -Rx Ibuprofen 800 mg per pt request.  Procedure: Joint Injection Location: Left 2nd TMT joint Skin Prep: Alcohol. Injectate: 0.5 cc 1% lidocaine plain, 0.5 cc dexamethasone phosphate. Disposition: Patient tolerated procedure well. Injection site dressed with a band-aid.   No follow-ups on file.

## 2020-06-29 ENCOUNTER — Other Ambulatory Visit: Payer: Self-pay

## 2020-06-29 ENCOUNTER — Ambulatory Visit (INDEPENDENT_AMBULATORY_CARE_PROVIDER_SITE_OTHER): Payer: Medicare (Managed Care) | Admitting: Podiatry

## 2020-06-29 DIAGNOSIS — M7752 Other enthesopathy of left foot: Secondary | ICD-10-CM

## 2020-06-29 DIAGNOSIS — L03115 Cellulitis of right lower limb: Secondary | ICD-10-CM | POA: Diagnosis not present

## 2020-06-29 MED ORDER — CLINDAMYCIN HCL 300 MG PO CAPS: 300.0000 mg | ORAL_CAPSULE | Freq: Three times a day (TID) | ORAL | 0 refills | Status: DC

## 2020-06-29 NOTE — Progress Notes (Signed)
  Subjective:  Patient ID: Autumn Parrish, female    DOB: 13-Aug-1951,  MRN: [id number]  Chief Complaint  Patient presents with  . Follow-up    L midfoot. 9/10 pain per pt. Pt stated, "I take 3 of the ibuprofen 800mg  tablets at a time".   69 y.o. female presents with the above complaint. History confirmed with patient.   Objective:  Physical Exam: warm, good capillary refill, no trophic changes or ulcerative lesions, normal DP and PT pulses and normal sensory exam. Left Foot: POP 2nd MPJ R leg warmth and cellulitis no calf pain negative homan's   Assessment:   1. Capsulitis of metatarsophalangeal (MTP) joint of left foot   2. Cellulitis of right leg    Plan:  Patient was evaluated and treated and all questions answered.  Capsulitis left 2nd MPJ -Injection delivered left 2nd MPJ  Procedure: Joint Injection Location: Left 2nd MPJ joint Skin Prep: Alcohol. Injectate: 0.5 cc 1% lidocaine plain, 0.5 cc dexamethasone phosphate. Disposition: Patient tolerated procedure well. Injection site dressed with a band-aid.  Cellulitis right leg -Rx clindamycin  Return in about 2 weeks (around 07/13/2020) for Capsulitis and cellulitis f/u .

## 2020-07-07 ENCOUNTER — Ambulatory Visit: Payer: Medicare (Managed Care) | Admitting: Podiatry

## 2020-07-10 ENCOUNTER — Telehealth: Payer: Self-pay | Admitting: *Deleted

## 2020-07-10 NOTE — Telephone Encounter (Signed)
I spoke with [place] and she states pt says her pain in a 9 out of 10 and she was to get a prescription at the last visit but it has not been called in. I reviewed clinicals and clindamycin had been called to the [facility]. I informed Joy of the orders and that if pt was in that kind of pain she needed to be seen in ED. Joy states the pharmacy pt uses is [facility], but did not have their contact information, but gave clinical fax [phone] for me to send the clindamycin prescription and she would make sure it was filled for the pt. Faxed clindamycin hand written script as ordered 06/29/2020 to [facility], Attn: Regina.

## 2020-07-10 NOTE — Telephone Encounter (Signed)
[facility] - Autumn Parrish states they need information concerning pt's appt 06/29/2020.

## 2020-07-18 ENCOUNTER — Ambulatory Visit: Payer: Medicare (Managed Care) | Admitting: Podiatry

## 2020-08-11 ENCOUNTER — Ambulatory Visit: Payer: Medicare (Managed Care) | Admitting: Podiatry

## 2020-08-18 ENCOUNTER — Ambulatory Visit: Payer: Medicare (Managed Care) | Admitting: Podiatry

## 2020-08-24 ENCOUNTER — Ambulatory Visit: Payer: Medicare (Managed Care) | Admitting: Podiatry

## 2020-08-28 ENCOUNTER — Other Ambulatory Visit: Payer: Self-pay

## 2020-08-28 ENCOUNTER — Ambulatory Visit: Payer: Medicare (Managed Care) | Admitting: Podiatry

## 2020-08-28 ENCOUNTER — Ambulatory Visit (INDEPENDENT_AMBULATORY_CARE_PROVIDER_SITE_OTHER): Payer: Medicare (Managed Care) | Admitting: Podiatry

## 2020-08-28 DIAGNOSIS — M25572 Pain in left ankle and joints of left foot: Secondary | ICD-10-CM | POA: Diagnosis not present

## 2020-08-28 DIAGNOSIS — G8929 Other chronic pain: Secondary | ICD-10-CM | POA: Diagnosis not present

## 2020-08-28 DIAGNOSIS — M79672 Pain in left foot: Secondary | ICD-10-CM | POA: Diagnosis not present

## 2020-08-28 MED ORDER — IBUPROFEN 800 MG PO TABS: 800.0000 mg | ORAL_TABLET | Freq: Three times a day (TID) | ORAL | 0 refills | Status: AC | PRN

## 2020-08-28 MED ORDER — METHYLPREDNISOLONE 4 MG PO TBPK: ORAL_TABLET | ORAL | 0 refills | Status: DC

## 2020-08-28 NOTE — Progress Notes (Signed)
  Subjective:  Patient ID: Autumn Parrish, female    DOB: 10-Nov-1951,  MRN: [id number]  Chief Complaint  Patient presents with  . Foot Pain    pain to the top of left foot, injection at last visit helpful  . Follow-up    69 y.o. female presents with the above complaint. History confirmed with patient.  At last visit she had an injection of the second MTPJ which was helpful.  Now today the pain is more diffuse and spread throughout the foot ankle  Objective:  Physical Exam: warm, good capillary refill, no trophic changes or ulcerative lesions, no evidence of fracture or instability clinically.  Varicosities are present.  She has diffuse pain throughout the left ankle with palpation and range of motion as well as subtalar joint and midtarsal joint.  Assessment:   1. Chronic pain of left ankle   2. Chronic pain in left foot      Plan:  Patient was evaluated and treated and all questions answered.  Discussed with her that her diffuse pain could be of numerous etiologies including osteoarthritis, and today her pain is more generalized and diffuse and there is not a single target to inject.  I am concerned that she has potentially gout in the left foot and ankle.  I have sent her for lab work for an arthritis panel to evaluate this.  I have also prescribed a Medrol Dosepak taper and 800 mg Motrin to begin after she finishes the Medrol Dosepak  Return in about 6 weeks (around 10/09/2020).

## 2020-08-30 ENCOUNTER — Ambulatory Visit: Payer: Medicare (Managed Care) | Admitting: Podiatry

## 2020-09-27 ENCOUNTER — Other Ambulatory Visit: Payer: Self-pay

## 2020-09-27 ENCOUNTER — Ambulatory Visit (INDEPENDENT_AMBULATORY_CARE_PROVIDER_SITE_OTHER): Payer: Medicare (Managed Care) | Admitting: Podiatry

## 2020-09-27 DIAGNOSIS — M7752 Other enthesopathy of left foot: Secondary | ICD-10-CM

## 2020-09-27 DIAGNOSIS — M79674 Pain in right toe(s): Secondary | ICD-10-CM

## 2020-09-27 DIAGNOSIS — L84 Corns and callosities: Secondary | ICD-10-CM

## 2020-09-27 DIAGNOSIS — M79675 Pain in left toe(s): Secondary | ICD-10-CM

## 2020-09-27 DIAGNOSIS — B351 Tinea unguium: Secondary | ICD-10-CM

## 2020-09-27 MED ORDER — IBUPROFEN 600 MG PO TABS: 600.0000 mg | ORAL_TABLET | Freq: Three times a day (TID) | ORAL | 0 refills | Status: DC | PRN

## 2020-09-27 NOTE — Progress Notes (Signed)
Subjective:   Patient ID: Autumn Parrish, female   DOB: 69 y.o.   MRN: [id number]   HPI Patient presents stating she is getting pain on top of her left foot and her nails are thickened and she cannot take care of them along with lesions on both feet   ROS      Objective:  Physical Exam  Neurovascular status unchanged with extensor tendinitis left midfoot that is painful when pressed left thick yellow brittle nailbeds 1-5 both feet that are painful and lesions hallux bilateral that are painful     Assessment:  Chronic lesions chronic nail disease and dorsal extensor tendinitis left     Plan:  H&P reviewed all conditions and at this time I debrided nailbeds 1-5 both feet I debrided lesions bilateral and I did sterile prep and injected the dorsal tendon complex left 3 mg Dexasone Kenalog 5 mg Xylocaine and reappoint for routine care as needed

## 2020-11-22 ENCOUNTER — Other Ambulatory Visit: Payer: Self-pay | Admitting: Family Medicine

## 2020-11-22 ENCOUNTER — Ambulatory Visit
Admission: RE | Admit: 2020-11-22 | Discharge: 2020-11-22 | Disposition: A | Discharge status: Home / Self Care | Payer: No Typology Code available for payment source | Source: Ambulatory Visit | Attending: Family Medicine | Admitting: Family Medicine

## 2020-11-22 DIAGNOSIS — W19XXXA Unspecified fall, initial encounter: Secondary | ICD-10-CM

## 2021-01-17 ENCOUNTER — Other Ambulatory Visit: Payer: Self-pay

## 2021-01-17 ENCOUNTER — Ambulatory Visit (INDEPENDENT_AMBULATORY_CARE_PROVIDER_SITE_OTHER): Payer: Medicare (Managed Care) | Admitting: Orthopaedic Surgery

## 2021-01-17 ENCOUNTER — Ambulatory Visit (INDEPENDENT_AMBULATORY_CARE_PROVIDER_SITE_OTHER): Payer: Medicare (Managed Care)

## 2021-01-17 ENCOUNTER — Encounter: Payer: Self-pay | Admitting: Orthopaedic Surgery

## 2021-01-17 VITALS — Ht 66.5 in | Wt 275.0 lb

## 2021-01-17 DIAGNOSIS — M25561 Pain in right knee: Secondary | ICD-10-CM

## 2021-01-17 DIAGNOSIS — G8929 Other chronic pain: Secondary | ICD-10-CM

## 2021-01-17 DIAGNOSIS — M1711 Unilateral primary osteoarthritis, right knee: Secondary | ICD-10-CM

## 2021-01-17 NOTE — Progress Notes (Signed)
Office Visit Note   Patient: Autumn Parrish           Date of Birth: [birth year]           MRN: [id number] Visit Date: 01/17/2021              Requested by: [employer], [address] [place],  [place] [address] PCP: [facility] [place]   Assessment & Plan: Visit Diagnoses:  1. Chronic pain of right knee   2. Unilateral primary osteoarthritis, right knee   3. Morbid (severe) obesity due to excess calories ([facility])     Plan: End-stage osteoarthritis right knee with associated decreased bony mineralization.  Patient has a history of prior knee arthroscopy approximately 30 years ago in [place].  Developed an infection requiring further surgical debridement.  Presently has a little movement in the right knee and requires a walker.  I do not think she is a good candidate for knee replacement based on her weight and comorbidities discussed weight loss with her.  Be happy to refer her to bariatric clinic but she has to check with the insurance company.  I do not think injections or therapy will make much of a difference.  Her x-ray changes are really quite severe  Follow-Up Instructions: Return if symptoms worsen or fail to improve.   Orders:  Orders Placed This Encounter  Procedures  . XR KNEE 3 VIEW RIGHT   No orders of the defined types were placed in this encounter.     Procedures: No procedures performed   Clinical Data: No additional findings.   Subjective: Chief Complaint  Patient presents with  . Right Knee - Pain  Patient presents today for right knee pain. She said that her knee has hurt for many years. But worsening over time. She said that her pain is all throughout her knee, and worsens with flexion. She has been experiencing grinding, swelling, and giving way. She takes Ibuprofen for pain. She states that her knee was scoped about 30years ago.  Surgery was performed at [facility] when she developed a  postop infection requiring further debridement.  She notes that she has had trouble with her knee ever since that time.  Only in the past year has she been using a walker.  She has limited range of motion  HPI  Review of Systems   Objective: Vital Signs: Ht 5' 6.5" (1.689 m)   Wt 275 lb (124.7 kg)   BMI 43.72 kg/m   Physical Exam Constitutional:      Appearance: She is well-developed and well-nourished.  HENT:     Mouth/Throat:     Mouth: Oropharynx is clear and moist.  Eyes:     Extraocular Movements: EOM normal.     Pupils: Pupils are equal, round, and reactive to light.  Pulmonary:     Effort: Pulmonary effort is normal.  Skin:    General: Skin is warm and dry.  Neurological:     Mental Status: She is alert and oriented to person, place, and time.  Psychiatric:        Mood and Affect: Mood and affect normal.        Behavior: Behavior normal.     Ortho Exam BMI between 44 and 45.  Right knee was not hot warm or red.  Multiple surgical incisions and arthroscopic portals.  Well-healed.  No drainage.  Range of motion was approximately 20 to about 40  degrees.  No instability.  Does have some mild pitting edema distally.  +1 pulses.  Toes are warm.  Specialty Comments:  No specialty comments available.  Imaging: XR KNEE 3 VIEW RIGHT  Result Date: 01/17/2021 Films of the right knee were obtained in several projections.  There are end-stage osteoarthritic changes with bone-on-bone in the medial compartment and near bone-on-bone in the lateral compartment.  There is decreased bone mineralization large osteophytes and subchondral sclerosis.  Severe arthritis at the patellofemoral joint as well.  All consistent with end-stage OA.  Prior internal fixation screws in the proximal tibia    PMFS History: Patient Active Problem List   Diagnosis Date Noted  . Unilateral primary osteoarthritis, right knee 01/17/2021  . Morbid (severe) obesity due to excess calories ([place]) 01/17/2021   . Contusion of bone 03/29/2020  . Callus 03/29/2020  . Pain due to onychomycosis of toenails of both feet 12/28/2019  . Laceration of toe 12/28/2019  . Typical atrial flutter ([place]) 04/08/2018   Past Medical History:  Diagnosis Date  . ADD (attention deficit disorder)   . COPD (chronic obstructive pulmonary disease) ([place])   . Hypertension   . Hypothyroidism     Family History  Problem Relation Age of Onset  . Cancer Other   . Diabetes Other   . Heart disease Other   . Hypertension Other   . Obesity Other   . Alcoholism Other   . Stroke Other   . Hypercholesterolemia Other     Past Surgical History:  Procedure Laterality Date  . A-FLUTTER ABLATION N/A 04/08/2018   Procedure: A-FLUTTER ABLATION;  Surgeon: Evans Lance, MD;  Location: [facility];  Service: Cardiovascular;  Laterality: N/A;  . ABDOMINAL HYSTERECTOMY     age 32 d/t endometriosis  . KNEE ARTHROSCOPY Right   . NEUROMAL REMOVAL Right    Social History   Occupational History  . Not on file  Tobacco Use  . Smoking status: Former Smoker    Types: Cigarettes    Quit date: 02/17/2013    Years since quitting: 7.9  . Smokeless tobacco: Never Used  Vaping Use  . Vaping Use: Never used  Substance and Sexual Activity  . Alcohol use: Not Currently  . Drug use: Not Currently  . Sexual activity: Not on file

## 2021-02-06 ENCOUNTER — Ambulatory Visit: Payer: Medicare (Managed Care) | Admitting: Internal Medicine

## 2021-02-22 ENCOUNTER — Ambulatory Visit: Payer: Medicare (Managed Care) | Admitting: Internal Medicine

## 2021-04-20 ENCOUNTER — Other Ambulatory Visit: Payer: Self-pay | Admitting: Family Medicine

## 2021-04-20 DIAGNOSIS — Z1231 Encounter for screening mammogram for malignant neoplasm of breast: Secondary | ICD-10-CM

## 2021-04-25 ENCOUNTER — Ambulatory Visit: Payer: Medicare (Managed Care) | Admitting: Internal Medicine

## 2021-06-12 ENCOUNTER — Other Ambulatory Visit: Payer: Self-pay

## 2021-06-12 ENCOUNTER — Telehealth: Payer: Medicare (Managed Care) | Admitting: Internal Medicine

## 2021-06-12 NOTE — Progress Notes (Unsigned)
HPI  Allergies  Allergen Reactions   Nsaids Other (See Comments)    Upset stomach can tolerate ibuprofen      Current Outpatient Medications  Medication Sig Dispense Refill   acetaminophen (TYLENOL) 500 MG tablet Take 1,000 mg by mouth every 6 (six) hours as needed for moderate pain or headache.     amphetamine-dextroamphetamine (ADDERALL) 30 MG tablet Take 30 mg by mouth 2 (two) times daily.     ASPIRIN LOW DOSE 81 MG EC tablet      brimonidine (ALPHAGAN) 0.15 % ophthalmic solution      clindamycin (CLEOCIN) 300 MG capsule Take 1 capsule (300 mg total) by mouth 3 (three) times daily. 21 capsule 0   clobetasol ointment (TEMOVATE) 0.05 %      diltiazem (CARDIZEM CD) 240 MG 24 hr capsule Take 240 mg by mouth daily.     famotidine (PEPCID) 20 MG tablet      furosemide (LASIX) 20 MG tablet Take 1 tablet (20 mg total) by mouth daily. 90 tablet 3   ibuprofen (ADVIL) 600 MG tablet Take 1 tablet (600 mg total) by mouth every 8 (eight) hours as needed. 90 tablet 0   latanoprost (XALATAN) 0.005 % ophthalmic solution      levothyroxine (SYNTHROID, LEVOTHROID) 50 MCG tablet Take 50 mcg by mouth daily before breakfast.     methylPREDNISolone (MEDROL DOSEPAK) 4 MG TBPK tablet 6 day dose pack - take as directed 21 tablet 0   metoprolol succinate (TOPROL-XL) 50 MG 24 hr tablet      metoprolol tartrate (LOPRESSOR) 25 MG tablet Take 25 mg by mouth 2 (two) times daily.     Multiple Vitamins-Minerals (CENTRUM ADULTS PO) Take 1 tablet by mouth daily.      oxybutynin (DITROPAN XL) 15 MG 24 hr tablet Take 15 mg by mouth daily.      pravastatin (PRAVACHOL) 40 MG tablet      timolol (BETIMOL) 0.5 % ophthalmic solution Place 1 drop into both eyes daily.      traMADol (ULTRAM) 50 MG tablet Take 50 mg by mouth at bedtime.     VITAMIN D3 SUPER STRENGTH 50 MCG (2000 UT) tablet      No current facility-administered medications for this visit.     Past Medical History:  Diagnosis Date   ADD  (attention deficit disorder)    COPD (chronic obstructive pulmonary disease) (HCC)    Hypertension    Hypothyroidism     ROS:   All systems reviewed and negative except as noted in the HPI.   Past Surgical History:  Procedure Laterality Date   A-FLUTTER ABLATION N/A 04/08/2018   Procedure: A-FLUTTER ABLATION;  Surgeon: Evans Lance, MD;  Location: [facility];  Service: Cardiovascular;  Laterality: N/A;   ABDOMINAL HYSTERECTOMY     age 70 d/t endometriosis   KNEE ARTHROSCOPY Right    NEUROMAL REMOVAL Right      Family History  Problem Relation Age of Onset   Cancer Other    Diabetes Other    Heart disease Other    Hypertension Other    Obesity Other    Alcoholism Other    Stroke Other    Hypercholesterolemia Other      Social History   Socioeconomic History   Marital status: Divorced    Spouse name: Not on file   Number of children: Not on file   Years of education: Not on file   Highest education level:  Not on file  Occupational History   Not on file  Tobacco Use   Smoking status: Former    Pack years: 0.00    Types: Cigarettes    Quit date: 02/17/2013    Years since quitting: 8.3   Smokeless tobacco: Never  Vaping Use   Vaping Use: Never used  Substance and Sexual Activity   Alcohol use: Not Currently   Drug use: Not Currently   Sexual activity: Not on file  Other Topics Concern   Not on file  Social History Narrative   Not on file   Social Determinants of Health   Financial Resource Strain: Not on file  Food Insecurity: Not on file  Transportation Needs: Not on file  Physical Activity: Not on file  Stress: Not on file  Social Connections: Not on file  Intimate Partner Violence: Not on file     There were no vitals taken for this visit.  Physical Exam:  Well appearing NAD HEENT: Unremarkable Neck:  No JVD, no thyromegally Lymphatics:  No adenopathy Back:  No CVA tenderness Lungs:  Clear HEART:  Regular rate rhythm, no  murmurs, no rubs, no clicks Abd:  soft, positive bowel sounds, no organomegally, no rebound, no guarding Ext:  2 plus pulses, no edema, no cyanosis, no clubbing Skin:  No rashes no nodules Neuro:  CN II through XII intact, motor grossly intact  EKG  DEVICE  Normal device function.  See [facility] for details.   Assess/Plan:

## 2021-07-24 ENCOUNTER — Ambulatory Visit: Payer: Medicare (Managed Care)

## 2021-07-24 ENCOUNTER — Other Ambulatory Visit: Payer: Self-pay | Admitting: Hospitalist

## 2021-07-25 ENCOUNTER — Other Ambulatory Visit: Payer: Self-pay | Admitting: Hospitalist

## 2021-07-25 DIAGNOSIS — J449 Chronic obstructive pulmonary disease, unspecified: Secondary | ICD-10-CM

## 2021-07-25 DIAGNOSIS — Z87891 Personal history of nicotine dependence: Secondary | ICD-10-CM

## 2021-08-17 ENCOUNTER — Other Ambulatory Visit: Payer: Medicare (Managed Care)

## 2021-08-31 ENCOUNTER — Other Ambulatory Visit: Payer: Self-pay

## 2021-08-31 ENCOUNTER — Ambulatory Visit
Admission: RE | Admit: 2021-08-31 | Discharge: 2021-08-31 | Disposition: A | Discharge status: Home / Self Care | Payer: Medicare (Managed Care) | Source: Ambulatory Visit | Attending: Family Medicine | Admitting: Family Medicine

## 2021-08-31 DIAGNOSIS — Z1231 Encounter for screening mammogram for malignant neoplasm of breast: Secondary | ICD-10-CM

## 2021-09-03 ENCOUNTER — Ambulatory Visit
Admission: RE | Admit: 2021-09-03 | Discharge: 2021-09-03 | Disposition: A | Discharge status: Home / Self Care | Payer: Medicare (Managed Care) | Source: Ambulatory Visit | Attending: Hospitalist | Admitting: Hospitalist

## 2021-09-03 ENCOUNTER — Other Ambulatory Visit: Payer: Self-pay

## 2021-09-03 DIAGNOSIS — J449 Chronic obstructive pulmonary disease, unspecified: Secondary | ICD-10-CM

## 2021-09-03 DIAGNOSIS — Z87891 Personal history of nicotine dependence: Secondary | ICD-10-CM

## 2021-10-15 ENCOUNTER — Ambulatory Visit (INDEPENDENT_AMBULATORY_CARE_PROVIDER_SITE_OTHER): Payer: Medicare (Managed Care)

## 2021-10-15 ENCOUNTER — Ambulatory Visit (INDEPENDENT_AMBULATORY_CARE_PROVIDER_SITE_OTHER): Payer: Medicare (Managed Care) | Admitting: Podiatry

## 2021-10-15 ENCOUNTER — Other Ambulatory Visit: Payer: Self-pay

## 2021-10-15 DIAGNOSIS — L97512 Non-pressure chronic ulcer of other part of right foot with fat layer exposed: Secondary | ICD-10-CM | POA: Diagnosis not present

## 2021-10-15 DIAGNOSIS — L03031 Cellulitis of right toe: Secondary | ICD-10-CM | POA: Diagnosis not present

## 2021-10-15 MED ORDER — CEPHALEXIN 500 MG PO CAPS: 500.0000 mg | ORAL_CAPSULE | Freq: Three times a day (TID) | ORAL | 0 refills | Status: DC

## 2021-10-15 MED ORDER — MUPIROCIN 2 % EX OINT: 1.0000 "application " | TOPICAL_OINTMENT | Freq: Every day | CUTANEOUS | 2 refills | Status: DC

## 2021-10-15 MED ORDER — MUPIROCIN 2 % EX OINT
1.0000 | TOPICAL_OINTMENT | Freq: Two times a day (BID) | CUTANEOUS | 2 refills | Status: DC
Start: 2021-10-15 — End: 2021-10-15

## 2021-10-15 NOTE — Patient Instructions (Signed)
Vascular testing will be scheduled at:  [facility] [address] [place],  [place]  [address] Main: [phone]

## 2021-10-16 NOTE — Progress Notes (Signed)
  Subjective:  Patient ID: Autumn Parrish, female    DOB: 1951/03/25,  MRN: [id number]  Chief Complaint  Patient presents with   Diabetic Ulcer       diabetic ulcer great toe [facility] called to request appt    70 y.o. female presents with the above complaint. History confirmed with patient.  She is the ulcer has been there for a few weeks now she is not sure how it started and become worse this week.  The right is worse than the left.  There is very thick skin  Objective:  Physical Exam: warm, good capillary refill, DP reduced bilateral, no trophic changes or ulcerative lesions, and PT reduced bilateral.  Bilateral hallux there is severe hyperkeratosis and callus formation there is an ulceration on the right hallux, postdebridement measures 2.0 x 1.0 x 0.3 cm with exposed subcutaneous tissue, fissure on the left hallux with hyperkeratosis.  No purulence or malodor, there is mild erythema to the MTPJ   Radiographs: Multiple views x-ray of the right foot: no fracture, dislocation, swelling or degenerative changes noted and no soft tissue emphysema, no signs of osteomyelitis Assessment:   1. Ulcer of great toe, right, with fat layer exposed ([place])   2. Cellulitis of right toe      Plan:  Patient was evaluated and treated and all questions answered.  Patient educated on diabetes. Discussed proper diabetic foot care and discussed risks and complications of disease. Educated patient in depth on reasons to return to the office immediately should he/she discover anything concerning or new on the feet. All questions answered. Discussed proper shoes as well.   Ulcer right hallux -We discussed the etiology and factors that are a part of the wound healing process.  We also discussed the risk of infection both soft tissue and osteomyelitis from open ulceration.  Discussed the risk of limb loss if this happens or worsens. -Debridement as below. -Dressed with Iodosorb, DSD. -Placed order  for noninvasive vascular testing, she had previous testing about 2 years ago and showed good circulation -Rx for Keflex as there is some erythema to the MTPJ and I suspect she has a developing mild soft tissue infection -Rx for mupirocin 2% ointment change daily   Procedure: Excisional Debridement of Wound Rationale: Removal of non-viable soft tissue from the wound to promote healing.  Anesthesia: none Post-Debridement Wound Measurements: 2.0 cm x 1.0 cm x 0.3 cm  Type of Debridement: Sharp Excisional Tissue Removed: Non-viable soft tissue Depth of Debridement: subcutaneous tissue. Technique: Sharp excisional debridement to bleeding, viable wound base.  Dressing: Dry, sterile, compression dressing. Disposition: Patient tolerated procedure well.      Return in about 2 weeks (around 10/29/2021) for wound care.

## 2021-10-29 ENCOUNTER — Other Ambulatory Visit: Payer: Self-pay

## 2021-10-29 ENCOUNTER — Ambulatory Visit (INDEPENDENT_AMBULATORY_CARE_PROVIDER_SITE_OTHER): Payer: Medicare (Managed Care) | Admitting: Podiatry

## 2021-10-29 DIAGNOSIS — I739 Peripheral vascular disease, unspecified: Secondary | ICD-10-CM

## 2021-10-29 DIAGNOSIS — L97512 Non-pressure chronic ulcer of other part of right foot with fat layer exposed: Secondary | ICD-10-CM | POA: Diagnosis not present

## 2021-10-30 ENCOUNTER — Encounter (HOSPITAL_BASED_OUTPATIENT_CLINIC_OR_DEPARTMENT_OTHER): Payer: Medicare (Managed Care) | Admitting: Internal Medicine

## 2021-10-30 NOTE — Progress Notes (Signed)
  Subjective:  Patient ID: Autumn Parrish, female    DOB: November 06, 1951,  MRN: [id number]  Chief Complaint  Patient presents with   Foot Ulcer     diabetic ulcer great toe [facility] called to request appt             70 y.o. female presents with the above complaint. History confirmed with patient.  Overall doing well she noticed some bleeding from the wound  Objective:  Physical Exam: warm, good capillary refill, DP reduced bilateral, no trophic changes or ulcerative lesions, and PT reduced bilateral.  Bilateral hallux there is severe hyperkeratosis and callus formation there is an ulceration on the right hallux, postdebridement measures 1.8x0.9 x 0.3 cm with exposed subcutaneous tissue, fissure on the left hallux with hyperkeratosis.  No purulence or malodor, there is mild erythema to the MTPJ   Radiographs: Multiple views x-ray of the right foot: no fracture, dislocation, swelling or degenerative changes noted and no soft tissue emphysema, no signs of osteomyelitis  ABIs taken at [facility] and faxed to me showed 0.8 on the right side  Assessment:   1. Ulcer of great toe, right, with fat layer exposed ([place])   2. Peripheral arterial disease ([place])      Plan:  Patient was evaluated and treated and all questions answered.  Patient educated on diabetes. Discussed proper diabetic foot care and discussed risks and complications of disease. Educated patient in depth on reasons to return to the office immediately should he/she discover anything concerning or new on the feet. All questions answered. Discussed proper shoes as well.   Ulcer right hallux -We discussed the etiology and factors that are a part of the wound healing process.  We also discussed the risk of infection both soft tissue and osteomyelitis from open ulceration.  Discussed the risk of limb loss if this happens or worsens. -Debridement as below. -Dressed with Iodosorb, DSD. -ABI 0.8 referring to vein and vascular  specialist for evaluation -Continue mupirocin 2% ointment change daily   Procedure: Excisional Debridement of Wound Rationale: Removal of non-viable soft tissue from the wound to promote healing.  Anesthesia: none Post-Debridement Wound Measurements: 1.8 cm x 0.9 cm x 0.3 cm  Type of Debridement: Sharp Excisional Tissue Removed: Non-viable soft tissue Depth of Debridement: subcutaneous tissue. Technique: Sharp excisional debridement to bleeding, viable wound base.  Dressing: Dry, sterile, compression dressing. Disposition: Patient tolerated procedure well.      Return in about 3 weeks (around 11/19/2021) for wound care.

## 2021-11-07 ENCOUNTER — Telehealth: Payer: Self-pay | Admitting: *Deleted

## 2021-11-07 ENCOUNTER — Other Ambulatory Visit: Payer: Self-pay | Admitting: Podiatry

## 2021-11-07 DIAGNOSIS — L97512 Non-pressure chronic ulcer of other part of right foot with fat layer exposed: Secondary | ICD-10-CM

## 2021-11-19 NOTE — Telephone Encounter (Signed)
Error message

## 2021-11-20 ENCOUNTER — Ambulatory Visit (HOSPITAL_COMMUNITY): Payer: Medicare (Managed Care)

## 2021-11-22 ENCOUNTER — Ambulatory Visit: Payer: Medicare (Managed Care) | Admitting: Podiatry

## 2021-11-28 ENCOUNTER — Encounter: Payer: Medicare (Managed Care) | Admitting: Vascular Surgery

## 2021-11-29 ENCOUNTER — Ambulatory Visit: Payer: Medicare (Managed Care) | Admitting: Podiatry

## 2021-11-30 ENCOUNTER — Other Ambulatory Visit: Payer: Self-pay

## 2021-11-30 ENCOUNTER — Ambulatory Visit (HOSPITAL_COMMUNITY)
Admission: RE | Admit: 2021-11-30 | Discharge: 2021-11-30 | Disposition: A | Discharge status: Home / Self Care | Payer: Medicare (Managed Care) | Source: Ambulatory Visit | Attending: Cardiovascular Disease | Admitting: Cardiovascular Disease

## 2021-11-30 DIAGNOSIS — L97512 Non-pressure chronic ulcer of other part of right foot with fat layer exposed: Secondary | ICD-10-CM | POA: Diagnosis not present

## 2021-12-12 ENCOUNTER — Ambulatory Visit (INDEPENDENT_AMBULATORY_CARE_PROVIDER_SITE_OTHER): Payer: Medicare (Managed Care) | Admitting: Vascular Surgery

## 2021-12-12 ENCOUNTER — Other Ambulatory Visit: Payer: Self-pay

## 2021-12-12 ENCOUNTER — Encounter: Payer: Self-pay | Admitting: Vascular Surgery

## 2021-12-12 VITALS — BP 118/65 | HR 54 | Temp 97.9°F | Resp 20 | Ht 66.0 in | Wt 290.0 lb

## 2021-12-12 DIAGNOSIS — L89890 Pressure ulcer of other site, unstageable: Secondary | ICD-10-CM

## 2021-12-12 NOTE — Progress Notes (Signed)
Patient ID: Autumn Parrish, female   DOB: Feb 24, 1951, 71 y.o.   MRN: [id number]  Reason for Consult: New Patient (Initial Visit)   Referred by [facility]  Subjective:     HPI:  Autumn Parrish is a 71 y.o. female with history right-sided great toe ulcer followed by podiatry.  This has been carefully cared for with x-rays demonstrating no underlying issues including osteomyelitis.  She did have an ABI performed at [facility] which was abnormal.  She does have hypertension and is a former smoker as risk factors for vascular disease.  She denies diabetes.  She states that she has been putting antibiotic ointment and a bandage on the toe and has not had any progressive tissue loss.  She denies fevers or chills.   Past Medical History:  Diagnosis Date   ADD (attention deficit disorder)    COPD (chronic obstructive pulmonary disease) (HCC)    Hypertension    Hypothyroidism    Family History  Problem Relation Age of Onset   Cancer Other    Diabetes Other    Heart disease Other    Hypertension Other    Obesity Other    Alcoholism Other    Stroke Other    Hypercholesterolemia Other    Past Surgical History:  Procedure Laterality Date   A-FLUTTER ABLATION N/A 04/08/2018   Procedure: A-FLUTTER ABLATION;  Surgeon: Evans Lance, MD;  Location: [facility];  Service: Cardiovascular;  Laterality: N/A;   ABDOMINAL HYSTERECTOMY     age 36 d/t endometriosis   KNEE ARTHROSCOPY Right    NEUROMAL REMOVAL Right     Short Social History:  Social History   Tobacco Use   Smoking status: Former    Types: Cigarettes    Quit date: 02/17/2013    Years since quitting: 8.8   Smokeless tobacco: Never  Substance Use Topics   Alcohol use: Not Currently    Allergies  Allergen Reactions   Nsaids Other (See Comments)    Upset stomach can tolerate ibuprofen     Current Outpatient Medications  Medication Sig Dispense Refill   acetaminophen (TYLENOL) 500 MG tablet Take  1,000 mg by mouth every 6 (six) hours as needed for moderate pain or headache.     amphetamine-dextroamphetamine (ADDERALL) 30 MG tablet Take 30 mg by mouth 2 (two) times daily.     ASPIRIN LOW DOSE 81 MG EC tablet      brimonidine (ALPHAGAN) 0.15 % ophthalmic solution      clobetasol ointment (TEMOVATE) 0.05 %      diltiazem (CARDIZEM CD) 240 MG 24 hr capsule Take 240 mg by mouth daily.     famotidine (PEPCID) 20 MG tablet      ibuprofen (ADVIL) 600 MG tablet Take 1 tablet (600 mg total) by mouth every 8 (eight) hours as needed. 90 tablet 0   latanoprost (XALATAN) 0.005 % ophthalmic solution      levothyroxine (SYNTHROID, LEVOTHROID) 50 MCG tablet Take 50 mcg by mouth daily before breakfast.     methylPREDNISolone (MEDROL DOSEPAK) 4 MG TBPK tablet 6 day dose pack - take as directed 21 tablet 0   metoprolol succinate (TOPROL-XL) 50 MG 24 hr tablet      Multiple Vitamins-Minerals (CENTRUM ADULTS PO) Take 1 tablet by mouth daily.      mupirocin ointment (BACTROBAN) 2 % Apply 1 application topically daily. 30 g 2   oxybutynin (DITROPAN XL) 15 MG 24 hr tablet Take  15 mg by mouth daily.      pravastatin (PRAVACHOL) 40 MG tablet      timolol (BETIMOL) 0.5 % ophthalmic solution Place 1 drop into both eyes daily.      traMADol (ULTRAM) 50 MG tablet Take 50 mg by mouth at bedtime.     VITAMIN D3 SUPER STRENGTH 50 MCG (2000 UT) tablet      furosemide (LASIX) 20 MG tablet Take 1 tablet (20 mg total) by mouth daily. 90 tablet 3   No current facility-administered medications for this visit.    Review of Systems  Constitutional:  Constitutional negative. HENT: HENT negative.  Eyes: Eyes negative.  Cardiovascular: Cardiovascular negative.  GI: Gastrointestinal negative.  Musculoskeletal: Musculoskeletal negative.  Skin: Positive for wound.  Neurological: Neurological negative. Hematologic: Hematologic/lymphatic negative.  Psychiatric: Psychiatric negative.       Objective:  Objective    Vitals:   12/12/21 1101  BP: 118/65  Pulse: (!) 54  Resp: 20  Temp: 97.9 F (36.6 C)  SpO2: 92%  Weight: 290 lb (131.5 kg)  Height: 5\' 6"  (1.676 m)   Body mass index is 46.81 kg/m.  Physical Exam HENT:     Head: Normocephalic.     Nose:     Comments: Wearing a mask Eyes:     Pupils: Pupils are equal, round, and reactive to light.  Cardiovascular:     Pulses: Normal pulses.          Dorsalis pedis pulses are 2+ on the right side and 2+ on the left side.       Posterior tibial pulses are 2+ on the right side and 2+ on the left side.  Abdominal:     General: Abdomen is flat.     Palpations: Abdomen is soft.  Musculoskeletal:        General: Normal range of motion.     Right lower leg: Edema present.     Left lower leg: Edema present.  Skin:    General: Skin is warm.     Comments: Wound pictured below  Neurological:     General: No focal deficit present.     Mental Status: She is alert.  Psychiatric:        Mood and Affect: Mood normal.        Behavior: Behavior normal.        Thought Content: Thought content normal.        Judgment: Judgment normal.     Data: ABI Findings:  +---------+------------------+-----+----------------+--------+   Right     Rt Pressure (mmHg) Index Waveform         Comment    +---------+------------------+-----+----------------+--------+   Brachial  152                                                  +---------+------------------+-----+----------------+--------+   PTA       152                1.00  triphasic                   +---------+------------------+-----+----------------+--------+   PERO      152                1.00  biphasic                    +---------+------------------+-----+----------------+--------+  DP        160                1.05  barely triphasic            +---------+------------------+-----+----------------+--------+   Great Toe 96                 0.63  Normal                       +---------+------------------+-----+----------------+--------+   +---------+------------------+-----+---------+-------+   Left      Lt Pressure (mmHg) Index Waveform  Comment   +---------+------------------+-----+---------+-------+   Brachial  144                                          +---------+------------------+-----+---------+-------+   PTA       148                0.97  triphasic           +---------+------------------+-----+---------+-------+   PERO      150                .99   triphasic           +---------+------------------+-----+---------+-------+   DP        158                1.04  triphasic           +---------+------------------+-----+---------+-------+   Great Toe 96                 0.63  Normal              +---------+------------------+-----+---------+-------+   +-------+-----------+-----------+------------+------------+   ABI/TBI Today's ABI Today's TBI Previous ABI Previous TBI   +-------+-----------+-----------+------------+------------+   Right   1.05        .63                                     +-------+-----------+-----------+------------+------------+   Left    1.04        .63                                     +-------+-----------+-----------+------------+------------+        TOES Findings:  +----------+---------------+--------+-------+   Right Toes Pressure (mmHg) Waveform Comment   +----------+---------------+--------+-------+   1st Digit                  Normal             +----------+---------------+--------+-------+   2nd Digit                  Normal             +----------+---------------+--------+-------+   3rd Digit                  Normal             +----------+---------------+--------+-------+   4th Digit                  Normal             +----------+---------------+--------+-------+   5th Digit  Abnormal           +----------+---------------+--------+-------+       +---------+---------------+--------+-------+    Left Toes Pressure (mmHg) Waveform Comment   +---------+---------------+--------+-------+   1st Digit                 Normal             +---------+---------------+--------+-------+   2nd Digit                 Abnormal           +---------+---------------+--------+-------+   3rd Digit                 Abnormal           +---------+---------------+--------+-------+   4th Digit                 Abnormal           +---------+---------------+--------+-------+   5th Digit                 Normal             +---------+---------------+--------+-------+     Bilateral ABIs appear essentially unchanged compared to prior study on  09/07/2019.     Summary:  Right: Resting right ankle-brachial index is within normal range. No  evidence of significant right lower extremity arterial disease. The right  toe-brachial index is abnormal.   Left: Resting left ankle-brachial index is within normal range. No  evidence of significant left lower extremity arterial disease. The left  toe-brachial index is abnormal.      Assessment/Plan:     71 year old female with what appears to be a diabetic toe ulceration has significant callus around it to suggest there is blood flow and she also has ABIs which are also consistent with adequate blood flow for tissue healing.  She will continue to follow with podiatry can see me on an as-needed basis.     Waynetta Sandy MD [facility]

## 2021-12-24 ENCOUNTER — Ambulatory Visit: Payer: Medicare (Managed Care) | Admitting: Podiatry

## 2022-01-21 ENCOUNTER — Other Ambulatory Visit: Payer: Self-pay

## 2022-01-21 ENCOUNTER — Ambulatory Visit (INDEPENDENT_AMBULATORY_CARE_PROVIDER_SITE_OTHER): Payer: Medicare (Managed Care) | Admitting: Podiatry

## 2022-01-21 DIAGNOSIS — M79674 Pain in right toe(s): Secondary | ICD-10-CM

## 2022-01-21 DIAGNOSIS — B351 Tinea unguium: Secondary | ICD-10-CM | POA: Diagnosis not present

## 2022-01-21 DIAGNOSIS — M79675 Pain in left toe(s): Secondary | ICD-10-CM

## 2022-01-21 DIAGNOSIS — L97512 Non-pressure chronic ulcer of other part of right foot with fat layer exposed: Secondary | ICD-10-CM | POA: Diagnosis not present

## 2022-01-21 NOTE — Progress Notes (Signed)
°  Subjective:  Patient ID: Autumn Parrish, female    DOB: [birth year],  MRN: [id number]  Chief Complaint  Patient presents with   Foot Ulcer    Follow up right great toe   Nail Problem    Thick painful toenails   Callouses    Painful callus lesions    71 y.o. female presents with the above complaint. History confirmed with patient.  Overall doing well she saw vascular surgery since I last saw her Objective:  Physical Exam: warm, good capillary refill, DP reduced bilateral, no trophic changes or ulcerative lesions, and PT reduced bilateral.  Bilateral hallux there is severe hyperkeratosis and callus formation there is an ulceration on the right hallux, postdebridement measures 0.8x0.5 x 0.3 cm with exposed subcutaneous tissue, fissure on the left hallux with hyperkeratosis.  No purulence or malodor, no cellulitis or erythema.  Thickened elongated nails x10 with subungual debris and yellow-brown discoloration    Radiographs: Multiple views x-ray of the right foot: no fracture, dislocation, swelling or degenerative changes noted and no soft tissue emphysema, no signs of osteomyelitis  ABIs at vascular office improved, no need for intervention  Assessment:   1. Ulcer of great toe, right, with fat layer exposed ([place])   2. Pain due to onychomycosis of toenails of both feet      Plan:  Patient was evaluated and treated and all questions answered.  Patient educated on diabetes. Discussed proper diabetic foot care and discussed risks and complications of disease. Educated patient in depth on reasons to return to the office immediately should he/she discover anything concerning or new on the feet. All questions answered. Discussed proper shoes as well.   Ulcer right hallux -We discussed the etiology and factors that are a part of the wound healing process.  We also discussed the risk of infection both soft tissue and osteomyelitis from open ulceration.  Discussed the risk of limb loss if  this happens or worsens. -Debridement as below. -Dressed with Silvadene, DSD. -She is using Silvadene at home can continue to use this   Procedure: Excisional Debridement of Wound Rationale: Removal of non-viable soft tissue from the wound to promote healing.  Anesthesia: none Post-Debridement Wound Measurements: 0.8 cm time 0.5 cm x 0.3 cm  Type of Debridement: Sharp Excisional Tissue Removed: Non-viable soft tissue Depth of Debridement: subcutaneous tissue. Technique: Sharp excisional debridement to bleeding, viable wound base.  Dressing: Dry, sterile, compression dressing. Disposition: Patient tolerated procedure well.      Return in about 3 weeks (around 02/11/2022) for wound care.

## 2022-01-28 ENCOUNTER — Other Ambulatory Visit: Payer: Self-pay | Admitting: Hospitalist

## 2022-01-28 DIAGNOSIS — Z1231 Encounter for screening mammogram for malignant neoplasm of breast: Secondary | ICD-10-CM

## 2022-02-13 ENCOUNTER — Ambulatory Visit
Admission: RE | Admit: 2022-02-13 | Discharge: 2022-02-13 | Disposition: A | Discharge status: Home / Self Care | Payer: Medicare (Managed Care) | Source: Ambulatory Visit | Attending: Family Medicine | Admitting: Family Medicine

## 2022-02-13 ENCOUNTER — Other Ambulatory Visit: Payer: Self-pay | Admitting: Family Medicine

## 2022-02-13 DIAGNOSIS — R0781 Pleurodynia: Secondary | ICD-10-CM

## 2022-02-13 DIAGNOSIS — R058 Other specified cough: Secondary | ICD-10-CM

## 2022-02-18 ENCOUNTER — Ambulatory Visit (INDEPENDENT_AMBULATORY_CARE_PROVIDER_SITE_OTHER): Payer: Medicare (Managed Care) | Admitting: Podiatry

## 2022-02-18 ENCOUNTER — Other Ambulatory Visit: Payer: Self-pay

## 2022-02-18 DIAGNOSIS — L97512 Non-pressure chronic ulcer of other part of right foot with fat layer exposed: Secondary | ICD-10-CM

## 2022-02-21 LAB — WOUND CULTURE
MICRO NUMBER:: 13122507
SPECIMEN QUALITY:: ADEQUATE

## 2022-02-24 NOTE — Progress Notes (Signed)
?  Subjective:  ?Patient ID: Autumn Parrish, female    DOB: 09-07-1951,  MRN: [id number] ? ?Chief Complaint  ?Patient presents with  ? Foot Ulcer  ?  Right great toe, 3 week follow up  ? ? ?71 y.o. female presents with the above complaint. History confirmed with patient.  She feels the wound continues to go up and down has been bleeding some ?Objective:  ?Physical Exam: ?warm, good capillary refill, DP reduced bilateral, no trophic changes or ulcerative lesions, and PT reduced bilateral.  Bilateral hallux there is severe hyperkeratosis and callus formation there is an ulceration on the right hallux, postdebridement measures 0.8x0.5 x 0.3 cm with exposed subcutaneous tissue, fissure on the left hallux with hyperkeratosis.  No purulence or malodor, no cellulitis or erythema.  Thickened elongated nails x10 with subungual debris and yellow-brown discoloration ? ? ? ?Radiographs: ?Multiple views x-ray of the right foot: no fracture, dislocation, swelling or degenerative changes noted and no soft tissue emphysema, no signs of osteomyelitis ? ?ABIs at vascular office improved, no need for intervention ? ?Assessment:  ? ?1. Ulcer of great toe, right, with fat layer exposed ([place])   ? ? ? ?Plan:  ?Patient was evaluated and treated and all questions answered. ? ?Patient educated on diabetes. Discussed proper diabetic foot care and discussed risks and complications of disease. Educated patient in depth on reasons to return to the office immediately should he/she discover anything concerning or new on the feet. All questions answered. Discussed proper shoes as well.  ? ?Ulcer right hallux ?-We discussed the etiology and factors that are a part of the wound healing process.  We also discussed the risk of infection both soft tissue and osteomyelitis from open ulceration.  Discussed the risk of limb loss if this happens or worsens. ?-Debridement as below. ?-Dressed with Silvadene, DSD. ?-She is using Silvadene at home can continue to  use this ?-Minimal changes in wound dimensions since last visit.  I took a wound culture of the area today.  Discussed with her the consideration of need for biopsy as this has been going on for several months now without improvement.  We will consider this after next visit. ? ?Procedure: Excisional Debridement of Wound ?Rationale: Removal of non-viable soft tissue from the wound to promote healing.  ?Anesthesia: none ?Post-Debridement Wound Measurements: 0.8 cm x 0.5 cm x 0.3 cm  ?Type of Debridement: Sharp Excisional ?Tissue Removed: Non-viable soft tissue ?Depth of Debridement: subcutaneous tissue. ?Technique: Sharp excisional debridement to bleeding, viable wound base.  ?Dressing: Dry, sterile, compression dressing. ?Disposition: Patient tolerated procedure well.    ? ? ?Return in about 4 weeks (around 03/18/2022) for wound care.  ? ? ?

## 2022-03-18 ENCOUNTER — Ambulatory Visit (INDEPENDENT_AMBULATORY_CARE_PROVIDER_SITE_OTHER): Payer: Medicare (Managed Care) | Admitting: Podiatry

## 2022-03-18 DIAGNOSIS — L97512 Non-pressure chronic ulcer of other part of right foot with fat layer exposed: Secondary | ICD-10-CM | POA: Diagnosis not present

## 2022-03-18 DIAGNOSIS — L309 Dermatitis, unspecified: Secondary | ICD-10-CM | POA: Diagnosis not present

## 2022-03-18 MED ORDER — CLOBETASOL PROPIONATE 0.05 % EX OINT: 1.0000 "application " | TOPICAL_OINTMENT | Freq: Two times a day (BID) | CUTANEOUS | 2 refills | Status: DC

## 2022-03-19 NOTE — Progress Notes (Signed)
?  Subjective:  ?Patient ID: Autumn Parrish, female    DOB: 1951/04/03,  MRN: [id number] ? ?Chief Complaint  ?Patient presents with  ? Foot Ulcer  ?  Follow up ulcer of the right great toe  ? ? ?71 y.o. female presents with the above complaint. History confirmed with patient.  She feels the wound continues to go up and down has been bleeding some, it grows very thick skin that sometimes bleeds ? ? ?Objective:  ?Physical Exam: ?warm, good capillary refill, DP reduced bilateral, no trophic changes or ulcerative lesions, and PT reduced bilateral.  Bilateral hallux there is severe hyperkeratosis and callus formation there is an ulceration on the right hallux centrally in the portion of the callus, postdebridement measures 0.5 x0.5 x 0.3 cm with exposed subcutaneous tissue, fissure on the left hallux with hyperkeratosis.  No purulence or malodor, no cellulitis or erythema.  Thickened elongated nails x10 with subungual debris and yellow-brown discoloration ? ? ? ?Radiographs: ?Multiple views x-ray of the right foot: no fracture, dislocation, swelling or degenerative changes noted and no soft tissue emphysema, no signs of osteomyelitis ? ?ABIs at vascular office improved, no need for intervention ? ?Assessment:  ? ?1. Ulcer of great toe, right, with fat layer exposed ([place])   ?2. Dermatitis   ? ? ? ?Plan:  ?Patient was evaluated and treated and all questions answered. ? ?Patient educated on diabetes. Discussed proper diabetic foot care and discussed risks and complications of disease. Educated patient in depth on reasons to return to the office immediately should he/she discover anything concerning or new on the feet. All questions answered. Discussed proper shoes as well.  ? ?Ulcer right hallux ?-We discussed the etiology and factors that are a part of the wound healing process.  We also discussed the risk of infection both soft tissue and osteomyelitis from open ulceration.  Discussed the risk of limb loss if this happens  or worsens. ?-Debridement as below. ?-Dressed with Silvadene, DSD. ?-She is using Silvadene at home can continue to use this ?-Continues to wax and wane.  I would like to see if this responds to topical steroid as if it is lichen simplex chronicus surrounding the ulcer.  I prescribed her Temovate ointment and reviewed its use.  She will continue mupirocin ointment in the central portion of the ulcer.  I will see her back in 4 weeks for follow-up.  We may still consider a biopsy of the area if not improving ? ?Procedure: Excisional Debridement of Wound ?Rationale: Removal of non-viable soft tissue from the wound to promote healing.  ?Anesthesia: none ?Post-Debridement Wound Measurements: 0.5 cm x 0.5 cm x 0.3 cm  ?Type of Debridement: Sharp Excisional ?Tissue Removed: Non-viable soft tissue ?Depth of Debridement: subcutaneous tissue. ?Technique: Sharp excisional debridement to bleeding, viable wound base.  ?Dressing: Dry, sterile, compression dressing. ?Disposition: Patient tolerated procedure well.    ? ? ?No follow-ups on file.  ? ? ?

## 2022-04-06 DIAGNOSIS — N179 Acute kidney failure, unspecified: Secondary | ICD-10-CM | POA: Insufficient documentation

## 2022-04-06 DIAGNOSIS — W19XXXA Unspecified fall, initial encounter: Secondary | ICD-10-CM | POA: Insufficient documentation

## 2022-04-06 DIAGNOSIS — E041 Nontoxic single thyroid nodule: Secondary | ICD-10-CM | POA: Insufficient documentation

## 2022-04-06 DIAGNOSIS — M25562 Pain in left knee: Secondary | ICD-10-CM | POA: Insufficient documentation

## 2022-04-06 DIAGNOSIS — M25551 Pain in right hip: Secondary | ICD-10-CM | POA: Insufficient documentation

## 2022-04-06 DIAGNOSIS — R609 Edema, unspecified: Secondary | ICD-10-CM | POA: Insufficient documentation

## 2022-04-06 DIAGNOSIS — Y92009 Unspecified place in unspecified non-institutional (private) residence as the place of occurrence of the external cause: Secondary | ICD-10-CM | POA: Insufficient documentation

## 2022-04-06 DIAGNOSIS — R600 Localized edema: Secondary | ICD-10-CM | POA: Insufficient documentation

## 2022-04-11 ENCOUNTER — Telehealth: Payer: Self-pay

## 2022-04-11 NOTE — Telephone Encounter (Signed)
NOTES SCANNED TO REFERRAL 

## 2022-04-23 ENCOUNTER — Ambulatory Visit: Payer: Medicare (Managed Care) | Admitting: Podiatry

## 2022-04-29 ENCOUNTER — Ambulatory Visit: Payer: Medicare (Managed Care) | Admitting: Podiatry

## 2022-04-30 DIAGNOSIS — J449 Chronic obstructive pulmonary disease, unspecified: Secondary | ICD-10-CM | POA: Insufficient documentation

## 2022-04-30 DIAGNOSIS — G9340 Encephalopathy, unspecified: Secondary | ICD-10-CM | POA: Insufficient documentation

## 2022-05-02 ENCOUNTER — Other Ambulatory Visit: Payer: Self-pay | Admitting: Family Medicine

## 2022-05-02 DIAGNOSIS — I950 Idiopathic hypotension: Secondary | ICD-10-CM | POA: Insufficient documentation

## 2022-05-02 DIAGNOSIS — J449 Chronic obstructive pulmonary disease, unspecified: Secondary | ICD-10-CM

## 2022-05-10 ENCOUNTER — Other Ambulatory Visit: Payer: Self-pay

## 2022-05-10 ENCOUNTER — Emergency Department (HOSPITAL_COMMUNITY): Payer: Medicare (Managed Care)

## 2022-05-10 ENCOUNTER — Encounter (HOSPITAL_COMMUNITY): Payer: Self-pay

## 2022-05-10 ENCOUNTER — Inpatient Hospital Stay (HOSPITAL_COMMUNITY)
Admission: EM | Admit: 2022-05-10 | Discharge: 2022-05-14 | DRG: 871 | Disposition: A | Discharge status: Skilled Nursing Facility | Payer: Medicare (Managed Care) | Attending: Internal Medicine | Admitting: Internal Medicine

## 2022-05-10 DIAGNOSIS — E1122 Type 2 diabetes mellitus with diabetic chronic kidney disease: Secondary | ICD-10-CM | POA: Diagnosis present

## 2022-05-10 DIAGNOSIS — G9389 Other specified disorders of brain: Secondary | ICD-10-CM | POA: Diagnosis present

## 2022-05-10 DIAGNOSIS — I130 Hypertensive heart and chronic kidney disease with heart failure and stage 1 through stage 4 chronic kidney disease, or unspecified chronic kidney disease: Secondary | ICD-10-CM | POA: Diagnosis present

## 2022-05-10 DIAGNOSIS — J449 Chronic obstructive pulmonary disease, unspecified: Secondary | ICD-10-CM | POA: Diagnosis present

## 2022-05-10 DIAGNOSIS — L03115 Cellulitis of right lower limb: Secondary | ICD-10-CM | POA: Diagnosis present

## 2022-05-10 DIAGNOSIS — G9341 Metabolic encephalopathy: Secondary | ICD-10-CM | POA: Diagnosis present

## 2022-05-10 DIAGNOSIS — I5032 Chronic diastolic (congestive) heart failure: Secondary | ICD-10-CM | POA: Diagnosis present

## 2022-05-10 DIAGNOSIS — Z888 Allergy status to other drugs, medicaments and biological substances status: Secondary | ICD-10-CM | POA: Diagnosis not present

## 2022-05-10 DIAGNOSIS — Z79890 Hormone replacement therapy (postmenopausal): Secondary | ICD-10-CM

## 2022-05-10 DIAGNOSIS — N1832 Chronic kidney disease, stage 3b: Secondary | ICD-10-CM | POA: Diagnosis present

## 2022-05-10 DIAGNOSIS — Z6841 Body Mass Index (BMI) 40.0 and over, adult: Secondary | ICD-10-CM | POA: Diagnosis not present

## 2022-05-10 DIAGNOSIS — N179 Acute kidney failure, unspecified: Secondary | ICD-10-CM | POA: Diagnosis present

## 2022-05-10 DIAGNOSIS — F0394 Unspecified dementia, unspecified severity, with anxiety: Secondary | ICD-10-CM | POA: Diagnosis present

## 2022-05-10 DIAGNOSIS — Z87820 Personal history of traumatic brain injury: Secondary | ICD-10-CM

## 2022-05-10 DIAGNOSIS — D3500 Benign neoplasm of unspecified adrenal gland: Secondary | ICD-10-CM | POA: Diagnosis present

## 2022-05-10 DIAGNOSIS — I959 Hypotension, unspecified: Principal | ICD-10-CM

## 2022-05-10 DIAGNOSIS — E872 Acidosis, unspecified: Secondary | ICD-10-CM | POA: Diagnosis present

## 2022-05-10 DIAGNOSIS — A419 Sepsis, unspecified organism: Secondary | ICD-10-CM | POA: Diagnosis present

## 2022-05-10 DIAGNOSIS — E860 Dehydration: Secondary | ICD-10-CM | POA: Diagnosis present

## 2022-05-10 DIAGNOSIS — R5381 Other malaise: Secondary | ICD-10-CM | POA: Diagnosis present

## 2022-05-10 DIAGNOSIS — Z882 Allergy status to sulfonamides status: Secondary | ICD-10-CM | POA: Diagnosis not present

## 2022-05-10 DIAGNOSIS — R4182 Altered mental status, unspecified: Secondary | ICD-10-CM | POA: Diagnosis present

## 2022-05-10 DIAGNOSIS — R296 Repeated falls: Secondary | ICD-10-CM | POA: Diagnosis present

## 2022-05-10 DIAGNOSIS — Z833 Family history of diabetes mellitus: Secondary | ICD-10-CM

## 2022-05-10 DIAGNOSIS — E039 Hypothyroidism, unspecified: Secondary | ICD-10-CM | POA: Diagnosis present

## 2022-05-10 DIAGNOSIS — F0393 Unspecified dementia, unspecified severity, with mood disturbance: Secondary | ICD-10-CM | POA: Diagnosis present

## 2022-05-10 DIAGNOSIS — Z79899 Other long term (current) drug therapy: Secondary | ICD-10-CM

## 2022-05-10 DIAGNOSIS — F988 Other specified behavioral and emotional disorders with onset usually occurring in childhood and adolescence: Secondary | ICD-10-CM | POA: Diagnosis present

## 2022-05-10 DIAGNOSIS — Z7982 Long term (current) use of aspirin: Secondary | ICD-10-CM

## 2022-05-10 DIAGNOSIS — Z87891 Personal history of nicotine dependence: Secondary | ICD-10-CM

## 2022-05-10 DIAGNOSIS — E785 Hyperlipidemia, unspecified: Secondary | ICD-10-CM | POA: Diagnosis present

## 2022-05-10 DIAGNOSIS — Z886 Allergy status to analgesic agent status: Secondary | ICD-10-CM

## 2022-05-10 DIAGNOSIS — Z8249 Family history of ischemic heart disease and other diseases of the circulatory system: Secondary | ICD-10-CM

## 2022-05-10 DIAGNOSIS — R6520 Severe sepsis without septic shock: Secondary | ICD-10-CM | POA: Diagnosis present

## 2022-05-10 LAB — CBC WITH DIFFERENTIAL/PLATELET
Abs Immature Granulocytes: 0.05 10*3/uL (ref 0.00–0.07)
Basophils Absolute: 0.1 10*3/uL (ref 0.0–0.1)
Basophils Relative: 0 %
Eosinophils Absolute: 0.2 10*3/uL (ref 0.0–0.5)
Eosinophils Relative: 1 %
HCT: 36.8 % (ref 36.0–46.0)
Hemoglobin: 11.3 g/dL — ABNORMAL LOW (ref 12.0–15.0)
Immature Granulocytes: 0 %
Lymphocytes Relative: 33 %
Lymphs Abs: 5.2 10*3/uL — ABNORMAL HIGH (ref 0.7–4.0)
MCH: 29.2 pg (ref 26.0–34.0)
MCHC: 30.7 g/dL (ref 30.0–36.0)
MCV: 95.1 fL (ref 80.0–100.0)
Monocytes Absolute: 1.3 10*3/uL — ABNORMAL HIGH (ref 0.1–1.0)
Monocytes Relative: 8 %
Neutro Abs: 9 10*3/uL — ABNORMAL HIGH (ref 1.7–7.7)
Neutrophils Relative %: 58 %
Platelets: 192 10*3/uL (ref 150–400)
RBC: 3.87 MIL/uL (ref 3.87–5.11)
RDW: 14.3 % (ref 11.5–15.5)
WBC: 15.8 10*3/uL — ABNORMAL HIGH (ref 4.0–10.5)
nRBC: 0 % (ref 0.0–0.2)

## 2022-05-10 LAB — I-STAT VENOUS BLOOD GAS, ED
Acid-base deficit: 4 mmol/L — ABNORMAL HIGH (ref 0.0–2.0)
Bicarbonate: 21.5 mmol/L (ref 20.0–28.0)
Calcium, Ion: 0.96 mmol/L — ABNORMAL LOW (ref 1.15–1.40)
HCT: 34 % — ABNORMAL LOW (ref 36.0–46.0)
Hemoglobin: 11.6 g/dL — ABNORMAL LOW (ref 12.0–15.0)
O2 Saturation: 99 %
Potassium: 3.5 mmol/L (ref 3.5–5.1)
Sodium: 140 mmol/L (ref 135–145)
TCO2: 23 mmol/L (ref 22–32)
pCO2, Ven: 39.7 mmHg — ABNORMAL LOW (ref 44–60)
pH, Ven: 7.342 (ref 7.25–7.43)
pO2, Ven: 154 mmHg — ABNORMAL HIGH (ref 32–45)

## 2022-05-10 LAB — GLUCOSE, CAPILLARY: Glucose-Capillary: 103 mg/dL — ABNORMAL HIGH (ref 70–99)

## 2022-05-10 LAB — COMPREHENSIVE METABOLIC PANEL
ALT: 20 U/L (ref 0–44)
AST: 36 U/L (ref 15–41)
Albumin: 3.2 g/dL — ABNORMAL LOW (ref 3.5–5.0)
Alkaline Phosphatase: 75 U/L (ref 38–126)
Anion gap: 10 (ref 5–15)
BUN: 42 mg/dL — ABNORMAL HIGH (ref 8–23)
CO2: 20 mmol/L — ABNORMAL LOW (ref 22–32)
Calcium: 8 mg/dL — ABNORMAL LOW (ref 8.9–10.3)
Chloride: 110 mmol/L (ref 98–111)
Creatinine, Ser: 3.12 mg/dL — ABNORMAL HIGH (ref 0.44–1.00)
GFR, Estimated: 15 mL/min — ABNORMAL LOW (ref >60)
Glucose, Bld: 124 mg/dL — ABNORMAL HIGH (ref 70–99)
Potassium: 3.6 mmol/L (ref 3.5–5.1)
Sodium: 140 mmol/L (ref 135–145)
Total Bilirubin: 0.6 mg/dL (ref 0.3–1.2)
Total Protein: 6 g/dL — ABNORMAL LOW (ref 6.5–8.1)

## 2022-05-10 LAB — CBC
HCT: 35.1 % — ABNORMAL LOW (ref 36.0–46.0)
Hemoglobin: 10.8 g/dL — ABNORMAL LOW (ref 12.0–15.0)
MCH: 28.6 pg (ref 26.0–34.0)
MCHC: 30.8 g/dL (ref 30.0–36.0)
MCV: 93.1 fL (ref 80.0–100.0)
Platelets: 158 10*3/uL (ref 150–400)
RBC: 3.77 MIL/uL — ABNORMAL LOW (ref 3.87–5.11)
RDW: 14.3 % (ref 11.5–15.5)
WBC: 10.7 10*3/uL — ABNORMAL HIGH (ref 4.0–10.5)
nRBC: 0 % (ref 0.0–0.2)

## 2022-05-10 LAB — URINALYSIS, ROUTINE W REFLEX MICROSCOPIC
Bilirubin Urine: NEGATIVE
Glucose, UA: NEGATIVE mg/dL
Hgb urine dipstick: NEGATIVE
Ketones, ur: NEGATIVE mg/dL
Leukocytes,Ua: NEGATIVE
Nitrite: NEGATIVE
Protein, ur: NEGATIVE mg/dL
Specific Gravity, Urine: 1.017 (ref 1.005–1.030)
pH: 5 (ref 5.0–8.0)

## 2022-05-10 LAB — CBG MONITORING, ED: Glucose-Capillary: 147 mg/dL — ABNORMAL HIGH (ref 70–99)

## 2022-05-10 LAB — RAPID URINE DRUG SCREEN, HOSP PERFORMED
Amphetamines: POSITIVE — AB
Barbiturates: NOT DETECTED
Benzodiazepines: NOT DETECTED
Cocaine: NOT DETECTED
Opiates: NOT DETECTED
Tetrahydrocannabinol: NOT DETECTED

## 2022-05-10 LAB — ETHANOL: Alcohol, Ethyl (B): <10 mg/dL (ref <10)

## 2022-05-10 LAB — LACTIC ACID, PLASMA
Lactic Acid, Venous: 0.9 mmol/L (ref 0.5–1.9)
Lactic Acid, Venous: 1.3 mmol/L (ref 0.5–1.9)

## 2022-05-10 LAB — PROTIME-INR
INR: 1.1 (ref 0.8–1.2)
INR: 1.1 (ref 0.8–1.2)
Prothrombin Time: 13.7 seconds (ref 11.4–15.2)
Prothrombin Time: 14.1 seconds (ref 11.4–15.2)

## 2022-05-10 LAB — MAGNESIUM: Magnesium: 2 mg/dL (ref 1.7–2.4)

## 2022-05-10 LAB — APTT: aPTT: 25 seconds (ref 24–36)

## 2022-05-10 LAB — SALICYLATE LEVEL: Salicylate Lvl: <7 mg/dL — ABNORMAL LOW (ref 7.0–30.0)

## 2022-05-10 LAB — TROPONIN I (HIGH SENSITIVITY)
Troponin I (High Sensitivity): 9 ng/L (ref <18)
Troponin I (High Sensitivity): 12 ng/L (ref <18)

## 2022-05-10 LAB — TSH: TSH: 0.593 u[IU]/mL (ref 0.350–4.500)

## 2022-05-10 LAB — BRAIN NATRIURETIC PEPTIDE: B Natriuretic Peptide: 125 pg/mL — ABNORMAL HIGH (ref 0.0–100.0)

## 2022-05-10 LAB — ACETAMINOPHEN LEVEL: Acetaminophen (Tylenol), Serum: <10 ug/mL — ABNORMAL LOW (ref 10–30)

## 2022-05-10 LAB — AMMONIA: Ammonia: 23 umol/L (ref 9–35)

## 2022-05-10 LAB — CREATININE, SERUM
Creatinine, Ser: 2.54 mg/dL — ABNORMAL HIGH (ref 0.44–1.00)
GFR, Estimated: 20 mL/min — ABNORMAL LOW (ref >60)

## 2022-05-10 MED ORDER — OXYBUTYNIN CHLORIDE ER 15 MG PO TB24
15.0000 mg | ORAL_TABLET | Freq: Every day | ORAL | Status: DC
  Administered 2022-05-10 – 2022-05-14 (×5): 15 mg via ORAL
  Filled 2022-05-10 (×6): qty 1

## 2022-05-10 MED ORDER — POLYETHYLENE GLYCOL 3350 17 G PO PACK: 17.0000 g | PACK | Freq: Every day | ORAL | Status: DC | PRN

## 2022-05-10 MED ORDER — LACTATED RINGERS IV SOLN: INTRAVENOUS | Status: DC

## 2022-05-10 MED ORDER — ONDANSETRON HCL 4 MG/2ML IJ SOLN: 4.0000 mg | Freq: Four times a day (QID) | INTRAMUSCULAR | Status: DC | PRN

## 2022-05-10 MED ORDER — VANCOMYCIN HCL 500 MG/100ML IV SOLN: 500.0000 mg | INTRAVENOUS | Status: DC

## 2022-05-10 MED ORDER — ASPIRIN 81 MG PO TBEC
81.0000 mg | DELAYED_RELEASE_TABLET | Freq: Every day | ORAL | Status: DC
  Administered 2022-05-11 – 2022-05-14 (×4): 81 mg via ORAL
  Filled 2022-05-10 (×4): qty 1

## 2022-05-10 MED ORDER — SODIUM CHLORIDE 0.9 % IV SOLN
2.0000 g | Freq: Once | INTRAVENOUS | Status: AC
  Administered 2022-05-10: 2 g via INTRAVENOUS
  Filled 2022-05-10: qty 12.5

## 2022-05-10 MED ORDER — SODIUM CHLORIDE 0.9 % IV SOLN: 2.0000 g | INTRAVENOUS | Status: DC

## 2022-05-10 MED ORDER — VANCOMYCIN HCL 2000 MG/400ML IV SOLN
2000.0000 mg | Freq: Once | INTRAVENOUS | Status: AC
  Administered 2022-05-10: 2000 mg via INTRAVENOUS
  Filled 2022-05-10: qty 400

## 2022-05-10 MED ORDER — MELATONIN 3 MG PO TABS
3.0000 mg | ORAL_TABLET | Freq: Every evening | ORAL | Status: DC | PRN
  Administered 2022-05-13: 3 mg via ORAL
  Filled 2022-05-10 (×2): qty 1

## 2022-05-10 MED ORDER — LEVOTHYROXINE SODIUM 75 MCG PO TABS
75.0000 ug | ORAL_TABLET | Freq: Every day | ORAL | Status: DC
  Administered 2022-05-11 – 2022-05-14 (×4): 75 ug via ORAL
  Filled 2022-05-10 (×4): qty 1

## 2022-05-10 MED ORDER — LACTATED RINGERS IV BOLUS
500.0000 mL | Freq: Once | INTRAVENOUS | Status: AC
  Administered 2022-05-10: 500 mL via INTRAVENOUS

## 2022-05-10 MED ORDER — CITALOPRAM HYDROBROMIDE 20 MG PO TABS
20.0000 mg | ORAL_TABLET | Freq: Every day | ORAL | Status: DC
  Administered 2022-05-10 – 2022-05-14 (×5): 20 mg via ORAL
  Filled 2022-05-10 (×5): qty 1

## 2022-05-10 MED ORDER — SODIUM CHLORIDE 0.9 % IV BOLUS
500.0000 mL | INTRAVENOUS | Status: AC
Start: 2022-05-10 — End: 2022-05-10
  Administered 2022-05-10: 500 mL via INTRAVENOUS

## 2022-05-10 MED ORDER — PRAVASTATIN SODIUM 40 MG PO TABS
80.0000 mg | ORAL_TABLET | Freq: Every day | ORAL | Status: DC
  Administered 2022-05-10 – 2022-05-14 (×5): 80 mg via ORAL
  Filled 2022-05-10 (×5): qty 2

## 2022-05-10 MED ORDER — ACETAMINOPHEN 325 MG PO TABS
650.0000 mg | ORAL_TABLET | Freq: Four times a day (QID) | ORAL | Status: DC | PRN
  Administered 2022-05-11 – 2022-05-14 (×5): 650 mg via ORAL
  Filled 2022-05-10 (×5): qty 2

## 2022-05-10 MED ORDER — VANCOMYCIN VARIABLE DOSE PER UNSTABLE RENAL FUNCTION (PHARMACIST DOSING): Status: DC

## 2022-05-10 MED ORDER — HEPARIN SODIUM (PORCINE) 5000 UNIT/ML IJ SOLN
5000.0000 [IU] | Freq: Three times a day (TID) | INTRAMUSCULAR | Status: DC
  Administered 2022-05-10 – 2022-05-14 (×11): 5000 [IU] via SUBCUTANEOUS
  Filled 2022-05-10 (×10): qty 1

## 2022-05-10 NOTE — ED Provider Notes (Signed)
[facility] EMERGENCY DEPARTMENT Provider Note   CSN: [id number] Arrival date & time: 05/10/22  1700     History  Chief Complaint  Patient presents with   AMS   Hypotension    Autumn Parrish is a 71 y.o. female with a history of COPD, HTN, hypothyroidism, obesity presenting to the ED with altered mental status and hypotension.  EMS was reportedly called by the patient's clinic for altered mental status.  On their arrival she was noted to be bradycardic into the 40s and hypotensive with systolics in the 92E.  She was initially given 1 mg of IV atropine with improvement in her heart rate, but no improvement in her blood pressure so she was initiated on epinephrine infusion.  On arrival to the ED, patient appears confused and is not able to provide much history.  She does state that she has felt "weird" for the past few days.  EMS states that they were told she was having some hallucinations and that this altered mental status is not her baseline per the clinic she was out.  She currently denies any chest pain, shortness of breath, abdominal pain, or fevers, however is an unreliable historian due to her altered mental status.  HPI     Home Medications Prior to Admission medications   Medication Sig Start Date End Date Taking? Authorizing Provider  ASPIRIN LOW DOSE 81 MG EC tablet Take 81 mg by mouth daily. 11/02/19  Yes [provider]  brimonidine (ALPHAGAN) 0.15 % ophthalmic solution Place 1 drop into both eyes 2 (two) times daily. 11/02/19  Yes [provider]  citalopram (CELEXA) 20 MG tablet Take 20 mg by mouth daily.   Yes [provider]  famotidine (PEPCID) 20 MG tablet Take 20 mg by mouth See admin instructions. Take 20 mg by mouth at bedtime 11/02/19  Yes [provider]  furosemide (LASIX) 40 MG tablet Take 40 mg by mouth daily.   Yes [provider]  latanoprost (XALATAN) 0.005 % ophthalmic solution Place 1 drop into  both eyes at bedtime. 11/02/19  Yes [provider]  levothyroxine (SYNTHROID) 75 MCG tablet Take 75 mcg by mouth daily before breakfast.   Yes [provider]  acetaminophen (TYLENOL) 500 MG tablet Take 1,000 mg by mouth every 6 (six) hours as needed for moderate pain or headache.    [provider]  amphetamine-dextroamphetamine (ADDERALL) 30 MG tablet Take 30 mg by mouth 2 (two) times daily.    [provider]  clobetasol ointment (TEMOVATE) 2.68 % Apply 1 application. topically 2 (two) times daily. 03/18/22   McDonald, Stephan Minister, DPM  diltiazem (CARDIZEM CD) 240 MG 24 hr capsule Take 240 mg by mouth daily.    [provider]  furosemide (LASIX) 20 MG tablet Take 1 tablet (20 mg total) by mouth daily. Patient not taking: Reported on 05/10/2022 05/13/18 05/10/22  Evans Lance, MD  ibuprofen (ADVIL) 600 MG tablet Take 1 tablet (600 mg total) by mouth every 8 (eight) hours as needed. 09/27/20   Wallene Huh, DPM  methylPREDNISolone (MEDROL DOSEPAK) 4 MG TBPK tablet 6 day dose pack - take as directed 08/28/20   Criselda Peaches, DPM  Multiple Vitamins-Minerals (CENTRUM ADULTS PO) Take 1 tablet by mouth daily.     [provider]  mupirocin ointment (BACTROBAN) 2 % Apply 1 application topically daily. 10/15/21   McDonald, Stephan Minister, DPM  oxybutynin (DITROPAN XL) 15 MG 24 hr tablet Take  15 mg by mouth daily.     [provider]  pravastatin (PRAVACHOL) 40 MG tablet  11/02/19   [provider]  timolol (BETIMOL) 0.5 % ophthalmic solution Place 1 drop into both eyes daily.     [provider]  traMADol (ULTRAM) 50 MG tablet Take 50 mg by mouth at bedtime. 08/10/20   [provider]  VITAMIN D3 SUPER STRENGTH 50 MCG (2000 UT) tablet  11/02/19   [provider]      Allergies    Ketorolac, Prochlorperazine, Promethazine, Sulfa antibiotics, Toradol [ketorolac tromethamine], Buprenorphine, and Nsaids    Review of  Systems   Review of Systems  Unable to perform ROS: Mental status change   Physical Exam Updated Vital Signs BP 112/69   Pulse 85   Temp 98.4 F (36.9 C) (Oral)   Resp 19   SpO2 99%  Physical Exam Constitutional:      Appearance: She is obese. She is ill-appearing. She is not diaphoretic.     Comments: Chronically ill-appearing.  HENT:     Head: Normocephalic and atraumatic.     Right Ear: External ear normal.     Left Ear: External ear normal.     Nose: Nose normal.     Mouth/Throat:     Pharynx: Oropharynx is clear.  Eyes:     General: No scleral icterus.    Extraocular Movements: Extraocular movements intact.     Pupils: Pupils are equal, round, and reactive to light.  Cardiovascular:     Rate and Rhythm: Bradycardia present. Rhythm irregular.  Pulmonary:     Effort: Pulmonary effort is normal. No respiratory distress.     Breath sounds: No wheezing or rhonchi.     Comments: Mildly decreased breath sounds over the lower lung fields. Abdominal:     General: There is no distension.     Palpations: Abdomen is soft.     Tenderness: There is no abdominal tenderness. There is no guarding or rebound.  Musculoskeletal:        General: No deformity.     Cervical back: Neck supple. No rigidity.     Right lower leg: Edema present.     Left lower leg: Edema present.  Skin:    General: Skin is warm and dry.  Neurological:     Mental Status: She is alert.     Comments: She is able to move all extremities equally.  No facial droop noted.  She is alert to self and knows she is in the hospital but is unable to provide the year or anything about the surrounding events.    ED Results / Procedures / Treatments   Labs (all labs ordered are listed, but only abnormal results are displayed) Labs Reviewed  CBC WITH DIFFERENTIAL/PLATELET - Abnormal; Notable for the following components:      Result Value   WBC 15.8 (*)    Hemoglobin 11.3 (*)    Neutro Abs 9.0 (*)    Lymphs Abs 5.2  (*)    Monocytes Absolute 1.3 (*)    All other components within normal limits  COMPREHENSIVE METABOLIC PANEL - Abnormal; Notable for the following components:   CO2 20 (*)    Glucose, Bld 124 (*)    BUN 42 (*)    Creatinine, Ser 3.12 (*)    Calcium 8.0 (*)    Total Protein 6.0 (*)    Albumin 3.2 (*)    GFR, Estimated 15 (*)    All other  components within normal limits  URINALYSIS, ROUTINE W REFLEX MICROSCOPIC - Abnormal; Notable for the following components:   APPearance HAZY (*)    All other components within normal limits  RAPID URINE DRUG SCREEN, HOSP PERFORMED - Abnormal; Notable for the following components:   Amphetamines POSITIVE (*)    All other components within normal limits  BRAIN NATRIURETIC PEPTIDE - Abnormal; Notable for the following components:   B Natriuretic Peptide 125.0 (*)    All other components within normal limits  CBG MONITORING, ED - Abnormal; Notable for the following components:   Glucose-Capillary 147 (*)    All other components within normal limits  I-STAT VENOUS BLOOD GAS, ED - Abnormal; Notable for the following components:   pCO2, Ven 39.7 (*)    pO2, Ven 154 (*)    Acid-base deficit 4.0 (*)    Calcium, Ion 0.96 (*)    HCT 34.0 (*)    Hemoglobin 11.6 (*)    All other components within normal limits  CULTURE, BLOOD (ROUTINE X 2)  CULTURE, BLOOD (ROUTINE X 2)  URINE CULTURE  MAGNESIUM  AMMONIA  TSH  LACTIC ACID, PLASMA  PROTIME-INR  ACETAMINOPHEN LEVEL  SALICYLATE LEVEL  ETHANOL  LACTIC ACID, PLASMA  PROTIME-INR  APTT  TROPONIN I (HIGH SENSITIVITY)  TROPONIN I (HIGH SENSITIVITY)    EKG EKG Interpretation  Date/Time:  Friday May 10 2022 17:10:02 EDT Ventricular Rate:  82 PR Interval:  160 QRS Duration: 102 QT Interval:  378 QTC Calculation: 442 R Axis:   65 Text Interpretation: Sinus rhythm Premature ventricular complexes Non-specific ST-t changes No previous tracing Confirmed by Lajean Saver [phone]) on 05/10/2022 6:22:51  PM  Radiology CT Head Wo Contrast  Result Date: 05/10/2022 CLINICAL DATA:  Mental status change, unknown cause EXAM: CT HEAD WITHOUT CONTRAST TECHNIQUE: Contiguous axial images were obtained from the base of the skull through the vertex without intravenous contrast. RADIATION DOSE REDUCTION: This exam was performed according to the departmental dose-optimization program which includes automated exposure control, adjustment of the mA and/or kV according to patient size and/or use of iterative reconstruction technique. COMPARISON:  None Available. BRAIN: BRAIN Patchy and confluent areas of decreased attenuation are noted throughout the deep and periventricular white matter of the cerebral hemispheres bilaterally, compatible with chronic microvascular ischemic disease. No evidence of large-territorial acute infarction. No parenchymal hemorrhage. No mass lesion. No extra-axial collection. No mass effect or midline shift. No hydrocephalus. Basilar cisterns are patent. Vascular: No hyperdense vessel. Skull: Limited evaluation for skull base fracture due to motion artifact. Sinuses/Orbits: Paranasal sinuses and mastoid air cells are clear. The orbits are unremarkable. Other: None. IMPRESSION: No acute intracranial abnormality. Electronically Signed   By: Iven Finn M.D.   On: 05/10/2022 19:30   DG Chest Portable 1 View  Result Date: 05/10/2022 CLINICAL DATA:  Altered mental status, hypotension EXAM: PORTABLE CHEST 1 VIEW COMPARISON:  04/30/2022 FINDINGS: Transverse diameter of heart is increased. Central pulmonary vessels are more prominent. Increased interstitial markings are seen in the both lungs, more so on the left side. Apparent shift of mediastinum to the right may be due to rotation. Lateral costophrenic angles are indistinct. There is no pneumothorax. IMPRESSION: Cardiomegaly. There is prominence of central pulmonary vessels and increased interstitial and alveolar markings in both lungs suggesting  pulmonary edema with CHF. Possibility of underlying pneumonia is not excluded. Possible small pleural effusions, more so on the left side. Electronically Signed   By: Elmer Picker M.D.   On: 05/10/2022 18:25  CT CHEST ABDOMEN PELVIS WO CONTRAST  Result Date: 05/10/2022 CLINICAL DATA:  Bradycardia and hypotension. EXAM: CT CHEST, ABDOMEN AND PELVIS WITHOUT CONTRAST TECHNIQUE: Multidetector CT imaging of the chest, abdomen and pelvis was performed following the standard protocol without IV contrast. RADIATION DOSE REDUCTION: This exam was performed according to the departmental dose-optimization program which includes automated exposure control, adjustment of the mA and/or kV according to patient size and/or use of iterative reconstruction technique. COMPARISON:  Apr 30, 2022 and September 03, 2021 FINDINGS: CT CHEST FINDINGS Cardiovascular: There is mild calcification of the aortic arch, without evidence of aortic aneurysm. A 9 mm thick crescentic shaped area of fluid attenuation (approximately 11.65 Hounsfield units) is seen within the right and anterior aspect of the superior aortic recess. This is mildly increased in size on the current exam and likely represents pericardial fluid. Normal heart size. Mediastinum/Nodes: No enlarged mediastinal, hilar, or axillary lymph nodes. Thyroid gland, trachea, and esophagus demonstrate no significant findings. Lungs/Pleura: Evaluation of the lung parenchyma is limited secondary to patient motion. Mild diffuse thickening of the interstitium is noted with mild atelectatic changes seen within the posterior aspects of the bilateral upper lobes and bilateral lung bases. Musculoskeletal: Multilevel degenerative changes seen throughout the thoracic spine. CT ABDOMEN PELVIS FINDINGS Hepatobiliary: No focal liver abnormality is seen. Status post cholecystectomy. No biliary dilatation. Pancreas: Unremarkable. No pancreatic ductal dilatation or surrounding inflammatory  changes. Spleen: Normal in size without focal abnormality. Adrenals/Urinary Tract: A 2.8 cm x 2.0 cm homogeneous low-attenuation left adrenal mass is seen (approximately 17.50 Hounsfield units). A 2.3 cm x 1.6 cm heterogeneous low-attenuation right adrenal mass is also noted (approximately 3.93 Hounsfield units). Kidneys are normal, without renal calculi, focal lesion, or hydronephrosis. Bladder is unremarkable. Stomach/Bowel: Stomach is within normal limits. Appendix appears normal. No evidence of bowel wall thickening, distention, or inflammatory changes. Noninflamed diverticula are seen throughout the large bowel. Vascular/Lymphatic: Aortic atherosclerosis. No enlarged abdominal or pelvic lymph nodes. Reproductive: Status post hysterectomy. No adnexal masses. Other: No abdominal wall hernia or abnormality. No abdominopelvic ascites. Musculoskeletal: Multilevel degenerative changes are seen throughout the lumbar spine. IMPRESSION: 1. Mild diffuse thickening of the interstitium with mild bilateral upper lobe and bibasilar atelectasis. 2. Bilateral low-attenuation adrenal masses which likely represent adrenal adenomas. Recommend adrenal washout CT or chemical shift MRI. JACR 2017 Aug; 14(8):1038-44, JCAT 2016 Mar-Apr; 40(2):194-200, Urol J 2006 Spring; 3(2):71-4. 3. Colonic diverticulosis. 4. Aortic atherosclerosis. Aortic Atherosclerosis (ICD10-I70.0). Electronically Signed   By: Virgina Norfolk M.D.   On: 05/10/2022 20:06    Procedures Procedures    Medications Ordered in ED Medications  vancomycin (VANCOREADY) IVPB 2000 mg/400 mL (2,000 mg Intravenous New Bag/Given 05/10/22 1933)  lactated ringers bolus 500 mL (0 mLs Intravenous Stopped 05/10/22 1902)  ceFEPIme (MAXIPIME) 2 g in sodium chloride 0.9 % 100 mL IVPB (0 g Intravenous Stopped 05/10/22 1928)    ED Course/ Medical Decision Making/ A&P                           Medical Decision Making Amount and/or Complexity of Data Reviewed Labs:  ordered. Radiology: ordered. ECG/medicine tests: ordered.  Risk Prescription drug management. Decision regarding hospitalization.   Autumn Parrish is a 71 y.o. female with a history of COPD, HTN, hypothyroidism, obesity presenting to the ED with altered mental status and hypotension.  On arrival to the ED, the patient was initially on an epinephrine infusion which was discontinued.  Her initial heart  rate is in the 70s but does slowly trend down to the 60s with discontinuation of the epinephrine drip.  However, on chart review, it appears her baseline heart rate is typically in the 50s.  Her blood pressure did initially maintain, but slowly down trended to the 74Q systolic so she was given 500 cc of IV fluid with good response.  POCT glucose is 147.  ECG was obtained which showed a sinus bradycardia with PVCs but no evidence of heart block.  On chart review, patient appears to have been recently discharged from [facility] after being admitted for very similar symptoms (metabolic encephalopathy, AKI, and hypotension).  She was discharged on antibiotics for a possible pneumonia.  Patient is unable to advise if she has been taking these medications or provide further history.  UA is not concerning for infection.  UDS is positive for amphetamines, but patient does take Adderall at baseline.  VBG shows a normal pH of 7.34 and PCO2 of 39.  CBC is notable for leukocytosis with a WBC of 15.8.  Given her hypotension and leukocytosis, code sepsis was initiated and patient was started on vancomycin and cefepime.  Blood cultures obtained.  Urine culture also obtained.  Her hemoglobin is 11.3 which appears to be close to her baseline.  CMP notable for an AKI with a creatinine of 3.1 and BUN of 42.  Her bicarb is 20.  Magnesium is normal.  Troponin is normal.  Ammonia is normal.  TSH is normal.  Her BNP is mildly elevated to 125.  CT head was obtained given her altered mental status and does not show any acute  intracranial abnormality.  Unfortunately, given her AKI, was unable to obtain contrasted scans, but a CT chest/abdomen/pelvis without contrast was obtained.  She does have mild diffuse thickening of the interstitium, ultimately nonspecific but could be pulmonary edema versus pneumonia.  Otherwise largely unremarkable.  Her altered mental status is likely a metabolic encephalopathy in the setting of her AKI.  I am concerned for sepsis given her hypotension, but at this time there is an unclear source.  Upon return from CT, patient was noted to be hypotensive again with systolics in the 59D/63O.  She was given another 500 cc of fluid with appropriate response.  Hospitalist team was contacted for admission and the patient was admitted to their service in guarded condition.        Final Clinical Impression(s) / ED Diagnoses Final diagnoses:  Hypotension, unspecified hypotension type  Sepsis with acute renal failure, due to unspecified organism, unspecified acute renal failure type, unspecified whether septic shock present ([place])  AKI (acute kidney injury) ([place])  Altered mental status, unspecified altered mental status type    Rx / DC Orders ED Discharge Orders     None         Sondra Come, MD 05/10/22 2056    Lajean Saver, MD 05/11/22 1506

## 2022-05-10 NOTE — Progress Notes (Signed)
Pharmacy Antibiotic Note  Autumn Parrish is a 71 y.o. female for which pharmacy has been consulted for cefepime and vancomycin dosing for sepsis.  Patient with a history of COPD, HTN, hypothyroidism, HF. Patient presenting with AMS & hypotension/bradycardia.  SCr 3.12 - appears acutely high WBC 15.8; LA 0.9; T 98.4 F; HR 85; RR 19  Plan: Cefepime 2g q24hr Vancomycin 2000 mg once, subsequent dosing as indicated per random vancomycin level until renal function stable and/or improved, at which time scheduled dosing can be considered Trend WBC, Fever, Renal function, & Clinical course F/u cultures, clinical course, WBC, fever De-escalate when able     No data recorded.  Recent Labs  Lab 05/10/22 1733  WBC 15.8*    CrCl cannot be calculated (Patient's most recent lab result is older than the maximum 21 days allowed.).    Allergies  Allergen Reactions   Nsaids Other (See Comments)    Upset stomach can tolerate ibuprofen     Antimicrobials this admission: cefepime 6/2 >>  vancomycin 6/2 >>  Microbiology results: Pending  Thank you for allowing pharmacy to be a part of this patient's care.  Lorelei Pont, PharmD, BCPS 05/10/2022 6:32 PM ED Clinical Pharmacist -  [phone]

## 2022-05-10 NOTE — ED Notes (Signed)
RN informed ED provider of pt BP 73/53 (59) upon returning from CT. Additional 55m lactated ringers infusing at this time. ED provider at pt bedside

## 2022-05-10 NOTE — ED Triage Notes (Signed)
Pt was BIB [facility] [place] d/t staff there stating she was altered & EMS arrived on scene & stated she was bradycardic & her SBP was 60 palpated. EMS started a 22g PIV in her Lt hand & gave her atropine while en route her pulse came up to 65 bpm with no BP improvement noted. She was started on Epi at 4 mcg/min. GCS 14 upon arrival to ED.

## 2022-05-10 NOTE — H&P (Signed)
History and Physical  Autumn Parrish BJY:[id number] DOB: 1951-06-12 DOA: 05/10/2022  Referring physician: Idolina Primer  PCP: System, Provider Not In  Outpatient Specialists: Cardiology, vascular surgery. Patient coming from: Home  Chief Complaint: Altered mental status.  Hypertension.  HPI: Autumn Parrish is a 71 y.o. female with medical history significant for COPD, severe morbid obesity, hypertension, hypothyroidism, ADHD, who presented to [facility] ED from her PCPs office due to altered mental status and hypotension.  She initially presented at [facility] clinic where she was found to be altered.  EMS was activated.  Per EMS she was bradycardic in the low 40s.  He received atropine by EMS.  She was also noted to be hypotensive.  She received 500cc IV fluid normal saline bolus in the ED with improvement of her blood pressure.  The patient was too altered to obtain a history.  Due to concern for possible active infective process of unclear etiology CT scan head chest abdomen pelvis without contrast was done but was unremarkable.  Cultures were collected and the patient received 1 dose of IV vancomycin and 1 dose of cefepime.  Lab studies revealed elevated creatinine 3.12, from baseline of 1.2.  Non anion gap metabolic acidosis with serum bicarb of 20.  On exam, her right lower extremity was noted to be erythematous, edematous and tender with palpation.  TRH, hospitalist service was asked to admit for acute metabolic encephalopathy, and right lower extremity cellulitis.  ED Course: Tmax 98.6.  BP 89/49 with MAP of 62, pulse 46, respiratory 18, saturation 95% on 2 L.  Lab studies remarkable for serum bicarb 20, BUN 42, creatinine 3.12, GFR 15.  BNP 125.  High-sensitivity troponin 9.  WBC 15.8.  Hemoglobin 11.3.  Review of Systems: Review of systems as noted in the HPI. All other systems reviewed and are negative.   Past Medical History:  Diagnosis Date   ADD (attention deficit disorder)    COPD  (chronic obstructive pulmonary disease) ([place])    Hypertension    Hypothyroidism    Past Surgical History:  Procedure Laterality Date   A-FLUTTER ABLATION N/A 04/08/2018   Procedure: A-FLUTTER ABLATION;  Surgeon: Evans Lance, MD;  Location: [facility];  Service: Cardiovascular;  Laterality: N/A;   ABDOMINAL HYSTERECTOMY     age 34 d/t endometriosis   KNEE ARTHROSCOPY Right    NEUROMAL REMOVAL Right     Social History:  reports that she quit smoking about 9 years ago. Her smoking use included cigarettes. She has never used smokeless tobacco. She reports that she does not currently use alcohol. She reports that she does not currently use drugs.   Allergies  Allergen Reactions   Ketorolac Anaphylaxis, Swelling and Other (See Comments)    Throat swells   Prochlorperazine Anaphylaxis, Shortness Of Breath, Swelling and Other (See Comments)    Throat swelling   Promethazine Anaphylaxis, Swelling and Other (See Comments)    Throat swells   Sulfa Antibiotics Anaphylaxis   Toradol [Ketorolac Tromethamine] Anaphylaxis   Buprenorphine Nausea Only   Nsaids Nausea Only and Other (See Comments)    NSAIDS upset the stomach- can tolerate ibuprofen     Family History  Problem Relation Age of Onset   Cancer Other    Diabetes Other    Heart disease Other    Hypertension Other    Obesity Other    Alcoholism Other    Stroke Other    Hypercholesterolemia Other       Prior to  Admission medications   Medication Sig Start Date End Date Taking? Authorizing Provider  acetaminophen (TYLENOL) 650 MG CR tablet Take 1,300 mg by mouth every 8 (eight) hours as needed for pain.   Yes [provider]  amphetamine-dextroamphetamine (ADDERALL) 30 MG tablet Take 30 mg by mouth 2 (two) times daily.   Yes [provider]  ASPIRIN LOW DOSE 81 MG EC tablet Take 81 mg by mouth daily. 11/02/19  Yes [provider]  brimonidine (ALPHAGAN) 0.15 % ophthalmic solution Place 1  drop into both eyes 2 (two) times daily. 11/02/19  Yes [provider]  citalopram (CELEXA) 20 MG tablet Take 20 mg by mouth daily.   Yes [provider]  famotidine (PEPCID) 20 MG tablet Take 20 mg by mouth See admin instructions. Take 20 mg by mouth at bedtime 11/02/19  Yes [provider]  furosemide (LASIX) 40 MG tablet Take 40 mg by mouth daily.   Yes [provider]  latanoprost (XALATAN) 0.005 % ophthalmic solution Place 1 drop into both eyes at bedtime. 11/02/19  Yes [provider]  levothyroxine (SYNTHROID) 75 MCG tablet Take 75 mcg by mouth daily before breakfast.   Yes [provider]  metoprolol succinate (TOPROL-XL) 100 MG 24 hr tablet Take 100 mg by mouth daily. Take with or immediately following a meal.   Yes [provider]  oxybutynin (DITROPAN XL) 15 MG 24 hr tablet Take 15 mg by mouth daily.    Yes [provider]  pravastatin (PRAVACHOL) 80 MG tablet Take 80 mg by mouth daily.   Yes [provider]  clobetasol ointment (TEMOVATE) 4.13 % Apply 1 application. topically 2 (two) times daily. Patient not taking: Reported on 05/10/2022 03/18/22   Criselda Peaches, DPM  furosemide (LASIX) 20 MG tablet Take 1 tablet (20 mg total) by mouth daily. Patient not taking: Reported on 05/10/2022 05/13/18 05/10/22  Evans Lance, MD  ibuprofen (ADVIL) 600 MG tablet Take 1 tablet (600 mg total) by mouth every 8 (eight) hours as needed. Patient not taking: Reported on 05/10/2022 09/27/20   Wallene Huh, DPM  methylPREDNISolone (MEDROL DOSEPAK) 4 MG TBPK tablet 6 day dose pack - take as directed Patient not taking: Reported on 05/10/2022 08/28/20   Criselda Peaches, DPM  mupirocin ointment (BACTROBAN) 2 % Apply 1 application topically daily. Patient not taking: Reported on 05/10/2022 10/15/21   Criselda Peaches, DPM    Physical Exam: BP 112/69   Pulse 85   Temp 98.4 F (36.9 C) (Oral)   Resp 19   SpO2 99%   General: 71  y.o. year-old female well developed well nourished in no acute distress.  Somnolent but arousable to voices.   Cardiovascular: Regular rate and rhythm with no rubs or gallops.  No thyromegaly or JVD noted.  No lower extremity edema. 2/4 pulses in all 4 extremities. Respiratory: Clear to auscultation with no wheezes or rales. Good inspiratory effort. Abdomen: Soft nontender nondistended with normal bowel sounds x4 quadrants. Muskuloskeletal: No cyanosis, clubbing or edema noted bilaterally Neuro: CN II-XII intact, strength, sensation, reflexes Skin: Right lower extremity erythematous, edematous, warm, tender with minimal palpation. Psychiatry: Judgement and insight appear altered.  Unable to assess mood due to somnolence.         Labs on Admission:  Basic Metabolic Panel: Recent Labs  Lab 05/10/22 1733 05/10/22 1802  NA 140 140  K 3.6 3.5  CL 110  --   CO2 20*  --  GLUCOSE 124*  --   BUN 42*  --   CREATININE 3.12*  --   CALCIUM 8.0*  --   MG 2.0  --    Liver Function Tests: Recent Labs  Lab 05/10/22 1733  AST 36  ALT 20  ALKPHOS 75  BILITOT 0.6  PROT 6.0*  ALBUMIN 3.2*   No results for input(s): LIPASE, AMYLASE in the last 168 hours. Recent Labs  Lab 05/10/22 1733  AMMONIA 23   CBC: Recent Labs  Lab 05/10/22 1733 05/10/22 1802  WBC 15.8*  --   NEUTROABS 9.0*  --   HGB 11.3* 11.6*  HCT 36.8 34.0*  MCV 95.1  --   PLT 192  --    Cardiac Enzymes: No results for input(s): CKTOTAL, CKMB, CKMBINDEX, TROPONINI in the last 168 hours.  BNP (last 3 results) Recent Labs    05/10/22 1733  BNP 125.0*    ProBNP (last 3 results) No results for input(s): PROBNP in the last 8760 hours.  CBG: Recent Labs  Lab 05/10/22 1715  GLUCAP 147*    Radiological Exams on Admission: CT Head Wo Contrast  Result Date: 05/10/2022 CLINICAL DATA:  Mental status change, unknown cause EXAM: CT HEAD WITHOUT CONTRAST TECHNIQUE: Contiguous axial images were obtained from the base  of the skull through the vertex without intravenous contrast. RADIATION DOSE REDUCTION: This exam was performed according to the departmental dose-optimization program which includes automated exposure control, adjustment of the mA and/or kV according to patient size and/or use of iterative reconstruction technique. COMPARISON:  None Available. BRAIN: BRAIN Patchy and confluent areas of decreased attenuation are noted throughout the deep and periventricular white matter of the cerebral hemispheres bilaterally, compatible with chronic microvascular ischemic disease. No evidence of large-territorial acute infarction. No parenchymal hemorrhage. No mass lesion. No extra-axial collection. No mass effect or midline shift. No hydrocephalus. Basilar cisterns are patent. Vascular: No hyperdense vessel. Skull: Limited evaluation for skull base fracture due to motion artifact. Sinuses/Orbits: Paranasal sinuses and mastoid air cells are clear. The orbits are unremarkable. Other: None. IMPRESSION: No acute intracranial abnormality. Electronically Signed   By: Iven Finn M.D.   On: 05/10/2022 19:30   DG Chest Portable 1 View  Result Date: 05/10/2022 CLINICAL DATA:  Altered mental status, hypotension EXAM: PORTABLE CHEST 1 VIEW COMPARISON:  04/30/2022 FINDINGS: Transverse diameter of heart is increased. Central pulmonary vessels are more prominent. Increased interstitial markings are seen in the both lungs, more so on the left side. Apparent shift of mediastinum to the right may be due to rotation. Lateral costophrenic angles are indistinct. There is no pneumothorax. IMPRESSION: Cardiomegaly. There is prominence of central pulmonary vessels and increased interstitial and alveolar markings in both lungs suggesting pulmonary edema with CHF. Possibility of underlying pneumonia is not excluded. Possible small pleural effusions, more so on the left side. Electronically Signed   By: Elmer Picker M.D.   On: 05/10/2022  18:25   CT CHEST ABDOMEN PELVIS WO CONTRAST  Result Date: 05/10/2022 CLINICAL DATA:  Bradycardia and hypotension. EXAM: CT CHEST, ABDOMEN AND PELVIS WITHOUT CONTRAST TECHNIQUE: Multidetector CT imaging of the chest, abdomen and pelvis was performed following the standard protocol without IV contrast. RADIATION DOSE REDUCTION: This exam was performed according to the departmental dose-optimization program which includes automated exposure control, adjustment of the mA and/or kV according to patient size and/or use of iterative reconstruction technique. COMPARISON:  Apr 30, 2022 and September 03, 2021 FINDINGS: CT CHEST FINDINGS Cardiovascular: There is mild calcification  of the aortic arch, without evidence of aortic aneurysm. A 9 mm thick crescentic shaped area of fluid attenuation (approximately 11.65 Hounsfield units) is seen within the right and anterior aspect of the superior aortic recess. This is mildly increased in size on the current exam and likely represents pericardial fluid. Normal heart size. Mediastinum/Nodes: No enlarged mediastinal, hilar, or axillary lymph nodes. Thyroid gland, trachea, and esophagus demonstrate no significant findings. Lungs/Pleura: Evaluation of the lung parenchyma is limited secondary to patient motion. Mild diffuse thickening of the interstitium is noted with mild atelectatic changes seen within the posterior aspects of the bilateral upper lobes and bilateral lung bases. Musculoskeletal: Multilevel degenerative changes seen throughout the thoracic spine. CT ABDOMEN PELVIS FINDINGS Hepatobiliary: No focal liver abnormality is seen. Status post cholecystectomy. No biliary dilatation. Pancreas: Unremarkable. No pancreatic ductal dilatation or surrounding inflammatory changes. Spleen: Normal in size without focal abnormality. Adrenals/Urinary Tract: A 2.8 cm x 2.0 cm homogeneous low-attenuation left adrenal mass is seen (approximately 17.50 Hounsfield units). A 2.3 cm x 1.6 cm  heterogeneous low-attenuation right adrenal mass is also noted (approximately 3.93 Hounsfield units). Kidneys are normal, without renal calculi, focal lesion, or hydronephrosis. Bladder is unremarkable. Stomach/Bowel: Stomach is within normal limits. Appendix appears normal. No evidence of bowel wall thickening, distention, or inflammatory changes. Noninflamed diverticula are seen throughout the large bowel. Vascular/Lymphatic: Aortic atherosclerosis. No enlarged abdominal or pelvic lymph nodes. Reproductive: Status post hysterectomy. No adnexal masses. Other: No abdominal wall hernia or abnormality. No abdominopelvic ascites. Musculoskeletal: Multilevel degenerative changes are seen throughout the lumbar spine. IMPRESSION: 1. Mild diffuse thickening of the interstitium with mild bilateral upper lobe and bibasilar atelectasis. 2. Bilateral low-attenuation adrenal masses which likely represent adrenal adenomas. Recommend adrenal washout CT or chemical shift MRI. JACR 2017 Aug; 14(8):1038-44, JCAT 2016 Mar-Apr; 40(2):194-200, Urol J 2006 Spring; 3(2):71-4. 3. Colonic diverticulosis. 4. Aortic atherosclerosis. Aortic Atherosclerosis (ICD10-I70.0). Electronically Signed   By: Virgina Norfolk M.D.   On: 05/10/2022 20:06    EKG: I independently viewed the EKG done and my findings are as followed: Sinus rhythm rate of 82.  Nonspecific ST-T changes.  QTc 442 with occasional PVCs.  Assessment/Plan Present on Admission:  AMS (altered mental status)  Principal Problem:   AMS (altered mental status)  Acute metabolic encephalopathy, likely multifactorial Treat underlying conditions Right lower extremity cellulitis Received IV vancomycin and cefepime in the ED, continue Gentle IV fluid hydration Follow cultures Reorient as needed Fall/aspiration/delirium precautions  Right lower extremity cellulitis, POA Continue empiric IV antibiotics started in the ED as stated above Obtain MRSA screening test Follow  blood cultures obtained on 05/10/2022.  AKI on CKD 3B Per medical records last creatinine 1.24, 9 days ago, with GFR 47. Presented with creatinine of 3.12 and GFR of 15 Avoid nephrotoxic agents, dehydration and hypotension Monitor urine output with strict I's and O's Gentle IV fluid hydration LR 50 cc/h x 2 days. Repeat renal panel in the morning. Abnormal findings on CT without contrast, bilateral adrenal masses, kidneys are normal in size without renal calculi, focal lesion or hydronephrosis.  Non anion gap metallic acidosis Serum bicarb 20, anion gap 10 Continue gentle IV fluid hydration Repeat Chemistry panel in the morning  Hypertension, currently hypotensive Received total of 1 L normal saline bolus to maintain MAP greater than 65 Hold off home oral antihypertensives. Closely monitor vital signs  Hypothyroidism Obtain TSH Resume home levothyroxine  Chronic anxiety/depression Resume home Celexa  Hyperlipidemia Resume home pravastatin  Severe morbid obesity Weight 131  kg Recommend weight loss outpatient with regular physical activity and healthy dieting   Critical care time: 65 minutes.   DVT prophylaxis: Subcu heparin 3 times daily  Code Status: Full code  Family Communication: None at bedside  Disposition Plan: Admitted to telemetry medical unit  Consults called: None.  Admission status: Inpatient status.   Status is: Inpatient The patient requires at least 2 midnights for further evaluation and treatment of present condition.   Kayleen Memos MD [facility] Pager [phone]  If 7PM-7AM, please contact night-coverage [email or web address] Password [facility]  05/10/2022, 9:33 PM

## 2022-05-10 NOTE — ED Notes (Signed)
Per ED provider, plan to finish 567m LR, infusing at 5028mhr, and reassess BP. Per provider, if blood pressure is hypotensive after fluids, will initiate vasopressor.

## 2022-05-11 ENCOUNTER — Inpatient Hospital Stay (HOSPITAL_COMMUNITY): Payer: Medicare (Managed Care)

## 2022-05-11 LAB — CBC WITH DIFFERENTIAL/PLATELET
Abs Immature Granulocytes: 0.03 10*3/uL (ref 0.00–0.07)
Basophils Absolute: 0.1 10*3/uL (ref 0.0–0.1)
Basophils Relative: 1 %
Eosinophils Absolute: 0.3 10*3/uL (ref 0.0–0.5)
Eosinophils Relative: 3 %
HCT: 34 % — ABNORMAL LOW (ref 36.0–46.0)
Hemoglobin: 10.7 g/dL — ABNORMAL LOW (ref 12.0–15.0)
Immature Granulocytes: 0 %
Lymphocytes Relative: 24 %
Lymphs Abs: 2.5 10*3/uL (ref 0.7–4.0)
MCH: 29.4 pg (ref 26.0–34.0)
MCHC: 31.5 g/dL (ref 30.0–36.0)
MCV: 93.4 fL (ref 80.0–100.0)
Monocytes Absolute: 1.1 10*3/uL — ABNORMAL HIGH (ref 0.1–1.0)
Monocytes Relative: 11 %
Neutro Abs: 6.3 10*3/uL (ref 1.7–7.7)
Neutrophils Relative %: 61 %
Platelets: 140 10*3/uL — ABNORMAL LOW (ref 150–400)
RBC: 3.64 MIL/uL — ABNORMAL LOW (ref 3.87–5.11)
RDW: 14.3 % (ref 11.5–15.5)
WBC: 10.3 10*3/uL (ref 4.0–10.5)
nRBC: 0 % (ref 0.0–0.2)

## 2022-05-11 LAB — COMPREHENSIVE METABOLIC PANEL
ALT: 22 U/L (ref 0–44)
AST: 35 U/L (ref 15–41)
Albumin: 2.8 g/dL — ABNORMAL LOW (ref 3.5–5.0)
Alkaline Phosphatase: 63 U/L (ref 38–126)
Anion gap: 7 (ref 5–15)
BUN: 38 mg/dL — ABNORMAL HIGH (ref 8–23)
CO2: 23 mmol/L (ref 22–32)
Calcium: 8 mg/dL — ABNORMAL LOW (ref 8.9–10.3)
Chloride: 112 mmol/L — ABNORMAL HIGH (ref 98–111)
Creatinine, Ser: 2.14 mg/dL — ABNORMAL HIGH (ref 0.44–1.00)
GFR, Estimated: 24 mL/min — ABNORMAL LOW (ref >60)
Glucose, Bld: 99 mg/dL (ref 70–99)
Potassium: 3.9 mmol/L (ref 3.5–5.1)
Sodium: 142 mmol/L (ref 135–145)
Total Bilirubin: 0.4 mg/dL (ref 0.3–1.2)
Total Protein: 5.2 g/dL — ABNORMAL LOW (ref 6.5–8.1)

## 2022-05-11 LAB — PHOSPHORUS: Phosphorus: 5.2 mg/dL — ABNORMAL HIGH (ref 2.5–4.6)

## 2022-05-11 LAB — MAGNESIUM: Magnesium: 1.9 mg/dL (ref 1.7–2.4)

## 2022-05-11 MED ORDER — SODIUM CHLORIDE 0.9 % IV SOLN
2.0000 g | Freq: Two times a day (BID) | INTRAVENOUS | Status: DC
  Administered 2022-05-11 (×2): 2 g via INTRAVENOUS
  Filled 2022-05-11 (×2): qty 12.5

## 2022-05-11 NOTE — Progress Notes (Signed)
New Admission Note:  Arrival Method: Via stretcher from ED to 61m4 Mental Orientation: Alert & Oriented x3 Telemetry: CCMD verified. Box-14 Assessment: Completed Skin: Refer to flowsheet IV: Left FA & Left Hand Pain: 0/10 Safety Measures: Safety Fall Prevention Plan discussed with patient. Admission: Completed [facility] [place] Orientation: Patient has been orientated to the room, unit and the staff.  Orders have been reviewed and are being implemented. Will continue to monitor the patient. Call light has been placed within reach and bed alarm has been activated.   JVassie Moselle RN  Phone Number: [phone]

## 2022-05-11 NOTE — Progress Notes (Addendum)
PROGRESS NOTE  Autumn Parrish  ZOX:[id number] DOB: 04-27-1951 DOA: 05/10/2022 PCP: System, Provider Not In   Brief Narrative:  Patient is a 71 year old female with history of COPD, morbid obesity, hypertension, hypothyroidism, ADHD,DM on Adderall who was sent to the ED by her PCP for the evaluation of altered mental status, hypotension.  She was also bradycardic in the low 40s.  Infectious process was suspected.  CT chest/abdomen/pelvis did not show any acute findings.  Lab work showed creatinine of 3.12, baseline creatinine 1.2, leukocytosis.  Right lower extremity was noted to be erythematous, edematous and tender.  Patient was admitted for the management of acute metabolic encephalopathy secondary to sepsis from right lower extremity cellulitis, AKI.  She was hypotensive on presentation and was started on IV fluids.   Assessment & Plan:  Principal Problem:   AMS (altered mental status)  Acute metabolic encephalopathy: Could be from sepsis from cellulitis.  Monitor mental status.  Treat underlying condition.  Fall ,aspiration, delirium precautions.  CT head did not show any acute intracranial abnormalities. H/O TBI 20 years ago,has recently got confused as per sister since last few months. Falling frequently at home.She was recently [facility],now living at apartment,walks with walker/motorised scooter.  We will also get MRI of the brain to rule out a stroke.  Sepsis secondary to right lower extremity cellulitis:Presented with hypotension, leukocytosis.  Right lower extremity noted to be edematous, tender, erythematous.  Continue broad-spectrum antibiotics for now.  Follow-up blood cultures.  AKI on CKD stage IIIb: Baseline creatinine of 1.2-1.3.  Presented with creatinine in the range of 3.  Likely from dehydration, hypotension.  Continue IV fluids.  Kidney function improving.  History of diastolic CHF: Last echo on 06/2018 showed EF of 60 to 81%, grade 1 diastolic  dysfunction.  Probably she was dehydrated on presentation.  She likes Lasix 40 mg daily at home.  Currently on IV fluids  Abnormal CT imaging: CT imaging showed bilateral low-attenuation adrenal masses which likely represent adrenal adenomas.  We recommend outpatient follow-up with adrenal washout CT or chemical shift MRI.  Hypertension: Continue IV fluids.  Home antihypertensives on hold because of hypotension  Hypothyroidism: Continue levothyroxine  Anxiety/depression: On Celexa at home.  Hyperlipidemia: On pravastatin  Morbid obesity: BMI 43  Debility/deconditioning: We will consult PT/OT.        DVT prophylaxis:heparin injection 5,000 Units Start: 05/10/22 2200     Code Status: Full Code  Family Communication: Called and discussed with sister Vickii Chafe on phone on 6/3  Patient status:Inpatient  Patient is from :Home  Anticipated discharge to:SNF  Estimated DC date:1-2 days.  Awaiting improvement in the kidney function, mental status   Consultants: None  Procedures: None  Antimicrobials:  Anti-infectives (From admission, onward)    Start     Dose/Rate Route Frequency Ordered Stop   05/11/22 1900  ceFEPIme (MAXIPIME) 2 g in sodium chloride 0.9 % 100 mL IVPB        2 g 200 mL/hr over 30 Minutes Intravenous Every 24 hours 05/10/22 2119     05/10/22 2345  vancomycin (VANCOREADY) IVPB 500 mg/100 mL  Status:  Discontinued        500 mg 100 mL/hr over 60 Minutes Intravenous Every 24 hours 05/10/22 2255 05/10/22 2257   05/10/22 2119  vancomycin variable dose per unstable renal function (pharmacist dosing)         Does not apply See admin instructions 05/10/22 2119     05/10/22 1845  vancomycin (VANCOREADY) IVPB 2000 mg/400 mL        2,000 mg 200 mL/hr over 120 Minutes Intravenous  Once 05/10/22 1832 05/10/22 2133   05/10/22 1845  ceFEPIme (MAXIPIME) 2 g in sodium chloride 0.9 % 100 mL IVPB        2 g 200 mL/hr over 30 Minutes Intravenous  Once 05/10/22 1832 05/10/22  1928       Subjective: Patient seen and examined at the bedside this morning.  Hemodynamically stable, lying in bed.  Overall looks comfortable but very weak, deconditioned, chronically ill looking.  Denies any complaints of chest pain, shortness of breath or cough.  Afebrile. She was oriented to place only.  She was adamant about leaving the hospital today.  Objective: Vitals:   05/10/22 2221 05/11/22 0034 05/11/22 0458 05/11/22 0804  BP: (!) 89/49 (!) 95/53 (!) 97/53 (!) 101/56  Pulse: (!) 46 (!) 44 77 78  Resp: '18 17 18 18  '$ Temp:  98.5 F (36.9 C) 97.8 F (36.6 C) 98 F (36.7 C)  TempSrc:  Axillary Oral Oral  SpO2: 95% 100% 99% 98%  Weight: 125.4 kg     Height: 5' 6.5" (1.689 m)       Intake/Output Summary (Last 24 hours) at 05/11/2022 0830 Last data filed at 05/11/2022 0601 Gross per 24 hour  Intake 983.11 ml  Output 500 ml  Net 483.11 ml   Filed Weights   05/10/22 2221  Weight: 125.4 kg    Examination:  General exam: Morbidly obese, weak, deconditioned, lying in bed HEENT: PERRL Respiratory system:  no wheezes or crackles  Cardiovascular system: S1 & S2 heard, RRR.  Gastrointestinal system: Abdomen is nondistended, soft and nontender. Central nervous system: Alert and awake, oriented to place only Extremities: Erythematous discoloration of right leg, abrasion on the left knee, no clubbing ,no cyanosis Skin: No rashes, no ulcers,no icterus     Data Reviewed: I have personally reviewed following labs and imaging studies  CBC: Recent Labs  Lab 05/10/22 1733 05/10/22 1802 05/10/22 2225 05/11/22 0220  WBC 15.8*  --  10.7* 10.3  NEUTROABS 9.0*  --   --  6.3  HGB 11.3* 11.6* 10.8* 10.7*  HCT 36.8 34.0* 35.1* 34.0*  MCV 95.1  --  93.1 93.4  PLT 192  --  158 062*   Basic Metabolic Panel: Recent Labs  Lab 05/10/22 1733 05/10/22 1802 05/10/22 2225 05/11/22 0220  NA 140 140  --  142  K 3.6 3.5  --  3.9  CL 110  --   --  112*  CO2 20*  --   --  23   GLUCOSE 124*  --   --  99  BUN 42*  --   --  38*  CREATININE 3.12*  --  2.54* 2.14*  CALCIUM 8.0*  --   --  8.0*  MG 2.0  --   --  1.9  PHOS  --   --   --  5.2*     Recent Results (from the past 240 hour(s))  Blood Culture (routine x 2)     Status: None (Preliminary result)   Collection Time: 05/10/22  6:31 PM   Specimen: BLOOD  Result Value Ref Range Status   Specimen Description BLOOD BLOOD LEFT FOREARM  Final   Special Requests   Final    BOTTLES DRAWN AEROBIC AND ANAEROBIC Blood Culture results may not be optimal due to an inadequate volume of blood received in culture bottles  Culture   Final    NO GROWTH < 12 HOURS Performed at [facility], [place] [address], [place], [place] [address]    Report Status PENDING  Incomplete  Blood Culture (routine x 2)     Status: None (Preliminary result)   Collection Time: 05/10/22 10:25 PM   Specimen: BLOOD RIGHT HAND  Result Value Ref Range Status   Specimen Description BLOOD RIGHT HAND  Final   Special Requests   Final    BOTTLES DRAWN AEROBIC ONLY Blood Culture adequate volume   Culture   Final    NO GROWTH < 12 HOURS Performed at [facility], [place] [address], [place], [place] [address]    Report Status PENDING  Incomplete     Radiology Studies: CT Head Wo Contrast  Result Date: 05/10/2022 CLINICAL DATA:  Mental status change, unknown cause EXAM: CT HEAD WITHOUT CONTRAST TECHNIQUE: Contiguous axial images were obtained from the base of the skull through the vertex without intravenous contrast. RADIATION DOSE REDUCTION: This exam was performed according to the departmental dose-optimization program which includes automated exposure control, adjustment of the mA and/or kV according to patient size and/or use of iterative reconstruction technique. COMPARISON:  None Available. BRAIN: BRAIN Patchy and confluent areas of decreased attenuation are noted throughout the deep and periventricular white matter of the cerebral  hemispheres bilaterally, compatible with chronic microvascular ischemic disease. No evidence of large-territorial acute infarction. No parenchymal hemorrhage. No mass lesion. No extra-axial collection. No mass effect or midline shift. No hydrocephalus. Basilar cisterns are patent. Vascular: No hyperdense vessel. Skull: Limited evaluation for skull base fracture due to motion artifact. Sinuses/Orbits: Paranasal sinuses and mastoid air cells are clear. The orbits are unremarkable. Other: None. IMPRESSION: No acute intracranial abnormality. Electronically Signed   By: Iven Finn M.D.   On: 05/10/2022 19:30   DG Chest Portable 1 View  Result Date: 05/10/2022 CLINICAL DATA:  Altered mental status, hypotension EXAM: PORTABLE CHEST 1 VIEW COMPARISON:  04/30/2022 FINDINGS: Transverse diameter of heart is increased. Central pulmonary vessels are more prominent. Increased interstitial markings are seen in the both lungs, more so on the left side. Apparent shift of mediastinum to the right may be due to rotation. Lateral costophrenic angles are indistinct. There is no pneumothorax. IMPRESSION: Cardiomegaly. There is prominence of central pulmonary vessels and increased interstitial and alveolar markings in both lungs suggesting pulmonary edema with CHF. Possibility of underlying pneumonia is not excluded. Possible small pleural effusions, more so on the left side. Electronically Signed   By: Elmer Picker M.D.   On: 05/10/2022 18:25   CT CHEST ABDOMEN PELVIS WO CONTRAST  Result Date: 05/10/2022 CLINICAL DATA:  Bradycardia and hypotension. EXAM: CT CHEST, ABDOMEN AND PELVIS WITHOUT CONTRAST TECHNIQUE: Multidetector CT imaging of the chest, abdomen and pelvis was performed following the standard protocol without IV contrast. RADIATION DOSE REDUCTION: This exam was performed according to the departmental dose-optimization program which includes automated exposure control, adjustment of the mA and/or kV according  to patient size and/or use of iterative reconstruction technique. COMPARISON:  Apr 30, 2022 and September 03, 2021 FINDINGS: CT CHEST FINDINGS Cardiovascular: There is mild calcification of the aortic arch, without evidence of aortic aneurysm. A 9 mm thick crescentic shaped area of fluid attenuation (approximately 11.65 Hounsfield units) is seen within the right and anterior aspect of the superior aortic recess. This is mildly increased in size on the current exam and likely represents pericardial fluid. Normal heart size. Mediastinum/Nodes: No  enlarged mediastinal, hilar, or axillary lymph nodes. Thyroid gland, trachea, and esophagus demonstrate no significant findings. Lungs/Pleura: Evaluation of the lung parenchyma is limited secondary to patient motion. Mild diffuse thickening of the interstitium is noted with mild atelectatic changes seen within the posterior aspects of the bilateral upper lobes and bilateral lung bases. Musculoskeletal: Multilevel degenerative changes seen throughout the thoracic spine. CT ABDOMEN PELVIS FINDINGS Hepatobiliary: No focal liver abnormality is seen. Status post cholecystectomy. No biliary dilatation. Pancreas: Unremarkable. No pancreatic ductal dilatation or surrounding inflammatory changes. Spleen: Normal in size without focal abnormality. Adrenals/Urinary Tract: A 2.8 cm x 2.0 cm homogeneous low-attenuation left adrenal mass is seen (approximately 17.50 Hounsfield units). A 2.3 cm x 1.6 cm heterogeneous low-attenuation right adrenal mass is also noted (approximately 3.93 Hounsfield units). Kidneys are normal, without renal calculi, focal lesion, or hydronephrosis. Bladder is unremarkable. Stomach/Bowel: Stomach is within normal limits. Appendix appears normal. No evidence of bowel wall thickening, distention, or inflammatory changes. Noninflamed diverticula are seen throughout the large bowel. Vascular/Lymphatic: Aortic atherosclerosis. No enlarged abdominal or pelvic lymph  nodes. Reproductive: Status post hysterectomy. No adnexal masses. Other: No abdominal wall hernia or abnormality. No abdominopelvic ascites. Musculoskeletal: Multilevel degenerative changes are seen throughout the lumbar spine. IMPRESSION: 1. Mild diffuse thickening of the interstitium with mild bilateral upper lobe and bibasilar atelectasis. 2. Bilateral low-attenuation adrenal masses which likely represent adrenal adenomas. Recommend adrenal washout CT or chemical shift MRI. JACR 2017 Aug; 14(8):1038-44, JCAT 2016 Mar-Apr; 40(2):194-200, Urol J 2006 Spring; 3(2):71-4. 3. Colonic diverticulosis. 4. Aortic atherosclerosis. Aortic Atherosclerosis (ICD10-I70.0). Electronically Signed   By: Virgina Norfolk M.D.   On: 05/10/2022 20:06    Scheduled Meds:  aspirin EC  81 mg Oral Daily   citalopram  20 mg Oral Daily   heparin  5,000 Units Subcutaneous Q8H   levothyroxine  75 mcg Oral QAC breakfast   oxybutynin  15 mg Oral Daily   pravastatin  80 mg Oral Daily   vancomycin variable dose per unstable renal function (pharmacist dosing)   Does not apply See admin instructions   Continuous Infusions:  ceFEPime (MAXIPIME) IV     lactated ringers 50 mL/hr at 05/10/22 2221     LOS: 1 day   Shelly Coss, MD [facility] P6/02/2022, 8:30 AM

## 2022-05-11 NOTE — Evaluation (Signed)
Physical Therapy Evaluation Patient Details Name: Autumn Parrish MRN: [id number] DOB: [birth year] Today's Date: 05/11/2022  History of Present Illness  71 y/o female presented to ED on 05/10/22 for AMS, bradycardia, and hypotension. R LE erythematous and edematous in the ED. Admitted for acute metabolic encephalopathy and R LE cellulitis. PMH: COPD, severe morbid obesity, HTN, ADHD.  Clinical Impression  Patient admitted with the above. Per sister, patient lives alone and uses RW but has had frequent falls in past month. Patient currently presents with weakness, impaired balance, decreased activity tolerance, and impaired cognition. Patient impulsive and unsafe with mobility requiring modA+2 for ambulation with RW. LOB x 1 requiring maxA to recover and return to seated position. Patient unaware of current deficits. Patient will benefit from skilled PT services during acute stay to address listed deficits. Recommend SNF at discharge to maximize functional mobility.        Recommendations for follow up therapy are one component of a multi-disciplinary discharge planning process, led by the attending physician.  Recommendations may be updated based on patient status, additional functional criteria and insurance authorization.  Follow Up Recommendations Skilled nursing-short term rehab (<3 hours/day)    Assistance Recommended at Discharge Frequent or constant Supervision/Assistance  Patient can return home with the following  A lot of help with walking and/or transfers;A lot of help with bathing/dressing/bathroom;Assistance with cooking/housework;Direct supervision/assist for medications management;Direct supervision/assist for financial management;Assist for transportation    Equipment Recommendations Rolling Letina Luckett (2 wheels)  Recommendations for Other Services       Functional Status Assessment Patient has had a recent decline in their functional status and demonstrates the ability to make  significant improvements in function in a reasonable and predictable amount of time.     Precautions / Restrictions Precautions Precautions: Fall Restrictions Weight Bearing Restrictions: No      Mobility  Bed Mobility Overal bed mobility: Needs Assistance Bed Mobility: Supine to Sit     Supine to sit: Min guard, HOB elevated          Transfers Overall transfer level: Needs assistance Equipment used: Rolling Emmajo Bennette (2 wheels) Transfers: Sit to/from Stand Sit to Stand: Min assist           General transfer comment: increased time, RLE does not really bend at knee    Ambulation/Gait Ambulation/Gait assistance: Mod assist, +2 safety/equipment Gait Distance (Feet): 40 Feet Assistive device: Rolling Hensley Treat (2 wheels) Gait Pattern/deviations: Step-through pattern, Decreased stride length, Staggering right, Drifts right/left, Trunk flexed Gait velocity: decreased     General Gait Details: Cues for close RW proximity with poor follow through. Close chair follow. [place] for balance and RW management. LOB x 1 requiring maxA to recover and placed in seated position in chair  Stairs            Wheelchair Mobility    Modified Rankin (Stroke Patients Only)       Balance Overall balance assessment: Needs assistance Sitting-balance support: No upper extremity supported, Feet supported Sitting balance-Leahy Scale: Good     Standing balance support: Bilateral upper extremity supported Standing balance-Leahy Scale: Poor                               Pertinent Vitals/Pain Pain Assessment Pain Assessment: No/denies pain    Home Living Family/patient expects to be discharged to:: Skilled nursing facility Living Arrangements: Alone   Type of Home: Apartment Home Access: Elevator  Home Equipment: Conservation officer, nature (2 wheels)      Prior Function Prior Level of Function : Needs assist             Mobility Comments: sister reports EMS  coming 6 times in past month for falls       Hand Dominance   Dominant Hand: Right    Extremity/Trunk Assessment   Upper Extremity Assessment Upper Extremity Assessment: Defer to OT evaluation    Lower Extremity Assessment Lower Extremity Assessment: RLE deficits/detail RLE Deficits / Details: hx of R knee surgeries and patient reporting inability to flexknee    Cervical / Trunk Assessment Cervical / Trunk Assessment: Kyphotic  Communication   Communication: No difficulties  Cognition Arousal/Alertness: Awake/alert Behavior During Therapy: Impulsive Overall Cognitive Status: Impaired/Different from baseline Area of Impairment: Orientation, Attention, Memory, Following commands, Safety/judgement, Awareness, Problem solving                 Orientation Level: Disoriented to, Time, Place, Situation (March 2015, could not tell us where she was unless we gave her choices) Current Attention Level: Sustained Memory: Decreased short-term memory Following Commands: Follows one step commands inconsistently Safety/Judgement: Decreased awareness of safety, Decreased awareness of deficits Awareness: Intellectual Problem Solving: Difficulty sequencing, Requires verbal cues, Requires tactile cues General Comments: unsafe with RW use even with cues        General Comments General comments (skin integrity, edema, etc.): VSS on RA    Exercises     Assessment/Plan    PT Assessment Patient needs continued PT services  PT Problem List Decreased strength;Decreased activity tolerance;Decreased balance;Decreased mobility;Decreased cognition;Decreased knowledge of use of DME;Decreased safety awareness;Decreased knowledge of precautions       PT Treatment Interventions DME instruction;Gait training;Functional mobility training;Therapeutic activities;Therapeutic exercise;Balance training;Patient/family education    PT Goals (Current goals can be found in the Care Plan section)   Acute Rehab PT Goals Patient Stated Goal: did not state PT Goal Formulation: With family Time For Goal Achievement: 05/25/22 Potential to Achieve Goals: Fair    Frequency Min 2X/week     Co-evaluation PT/OT/SLP Co-Evaluation/Treatment: Yes Reason for Co-Treatment: For patient/therapist safety;Necessary to address cognition/behavior during functional activity;To address functional/ADL transfers PT goals addressed during session: Mobility/safety with mobility;Balance;Proper use of DME;Strengthening/ROM OT goals addressed during session: Strengthening/ROM;ADL's and self-care;Proper use of Adaptive equipment and DME       AM-PAC PT "6 Clicks" Mobility  Outcome Measure Help needed turning from your back to your side while in a flat bed without using bedrails?: A Little Help needed moving from lying on your back to sitting on the side of a flat bed without using bedrails?: A Little Help needed moving to and from a bed to a chair (including a wheelchair)?: A Little Help needed standing up from a chair using your arms (e.g., wheelchair or bedside chair)?: Total Help needed to walk in hospital room?: Total Help needed climbing 3-5 steps with a railing? : Total 6 Click Score: 12    End of Session Equipment Utilized During Treatment: Gait belt Activity Tolerance: Patient tolerated treatment well Patient left: in chair;with call bell/phone within reach;with chair alarm set Nurse Communication: Mobility status PT Visit Diagnosis: Unsteadiness on feet (R26.81);Muscle weakness (generalized) (M62.81);Repeated falls (R29.6);Difficulty in walking, not elsewhere classified (R26.2)    Time: 9798-9211 PT Time Calculation (min) (ACUTE ONLY): 33 min   Charges:   PT Evaluation $PT Eval Moderate Complexity: 1 Mod          Makynzi Eastland A. Gilford Rile, PT,  DPT Acute Rehabilitation Services Pager [phone] Office [phone]   Linna Hoff 05/11/2022, 4:27 PM

## 2022-05-11 NOTE — Evaluation (Signed)
Occupational Therapy Evaluation Patient Details Name: Autumn Parrish MRN: [id number] DOB: 05/15/1951 Today's Date: 05/11/2022   History of Present Illness 71 y/o female presented to ED on 05/10/22 for AMS, bradycardia, and hypotension. R LE erythematous and edematous in the ED. Admitted for acute metabolic encephalopathy and R LE cellulitis. PMH: COPD, severe morbid obesity, HTN, ADHD.   Clinical Impression   This 71 yo female admitted with above presents to acute OT with PLOF of living on her own but per family has had 6 recent falls and going to to [place]. She currently is very impulsive and unsafe when up on her feet and needs setup/S- Min A for basic ADLs and +2 of any OOB mobility.She will continue to benefit from acute OT with follow up at SNF.      Recommendations for follow up therapy are one component of a multi-disciplinary discharge planning process, led by the attending physician.  Recommendations may be updated based on patient status, additional functional criteria and insurance authorization.   Follow Up Recommendations  Skilled nursing-short term rehab (<3 hours/day)    Assistance Recommended at Discharge Frequent or constant Supervision/Assistance  Patient can return home with the following A lot of help with walking and/or transfers;A little help with bathing/dressing/bathroom;Assist for transportation;Assistance with cooking/housework;Direct supervision/assist for financial management;Direct supervision/assist for medications management    Functional Status Assessment  Patient has had a recent decline in their functional status and demonstrates the ability to make significant improvements in function in a reasonable and predictable amount of time.  Equipment Recommendations  Other (comment) (TBD next venue)       Precautions / Restrictions Precautions Precautions: Fall Restrictions Weight Bearing Restrictions: No      Mobility Bed Mobility Overal bed  mobility: Needs Assistance Bed Mobility: Supine to Sit     Supine to sit: Min guard, HOB elevated          Transfers Overall transfer level: Needs assistance Equipment used: Rolling walker (2 wheels) Transfers: Sit to/from Stand Sit to Stand: Min assist           General transfer comment: increased time, RLE does not really bend at knee      Balance Overall balance assessment: Needs assistance Sitting-balance support: No upper extremity supported, Feet supported Sitting balance-Leahy Scale: Good     Standing balance support: Bilateral upper extremity supported Standing balance-Leahy Scale: Poor                             ADL either performed or assessed with clinical judgement   ADL Overall ADL's : Needs assistance/impaired Eating/Feeding: Set up;Sitting   Grooming: Set up;Supervision/safety;Sitting   Upper Body Bathing: Set up;Supervision/ safety;Sitting   Lower Body Bathing: Minimal assistance;Sit to/from stand   Upper Body Dressing : Set up;Supervision/safety;Sitting   Lower Body Dressing: Minimal assistance;Sit to/from stand Lower Body Dressing Details (indicate cue type and reason): Pt tends to lean really far forward to work on donning socks while seated EOB Toilet Transfer: Moderate assistance;+2 for safety/equipment;Ambulation;Rolling walker (2 wheels) Toilet Transfer Details (indicate cue type and reason): simulated bed>out door and down hall>sit in recliner brought up behind her Toileting- Clothing Manipulation and Hygiene: Moderate assistance Toileting - Clothing Manipulation Details (indicate cue type and reason): min A sit<>stand             Vision Patient Visual Report: No change from baseline  Pertinent Vitals/Pain Pain Assessment Pain Assessment: No/denies pain     Hand Dominance Right   Extremity/Trunk Assessment Upper Extremity Assessment Upper Extremity Assessment: Overall WFL for tasks assessed            Communication Communication Communication: No difficulties   Cognition Arousal/Alertness: Awake/alert Behavior During Therapy: Impulsive Overall Cognitive Status: Impaired/Different from baseline Area of Impairment: Orientation, Attention, Memory, Following commands, Safety/judgement, Awareness, Problem solving                 Orientation Level: Disoriented to, Time, Place, Situation (March 2015, could not tell us where she was unless we gave her choices) Current Attention Level: Sustained   Following Commands: Follows one step commands inconsistently Safety/Judgement: Decreased awareness of safety, Decreased awareness of deficits Awareness: Intellectual Problem Solving: Difficulty sequencing, Requires verbal cues, Requires tactile cues General Comments: unsafe with RW use even with cues     General Comments  VSS stable on RA            Home Living Family/patient expects to be discharged to:: Skilled nursing facility Living Arrangements: Alone   Type of Home: Apartment Home Access: Elevator                     Home Equipment: Conservation officer, nature (2 wheels)                  OT Problem List: Decreased range of motion;Impaired balance (sitting and/or standing);Decreased cognition;Decreased safety awareness      OT Treatment/Interventions: Self-care/ADL training;DME and/or AE instruction;Patient/family education;Balance training    OT Goals(Current goals can be found in the care plan section) Acute Rehab OT Goals Patient Stated Goal: unable due to confusion OT Goal Formulation: Patient unable to participate in goal setting Time For Goal Achievement: 05/25/22 Potential to Achieve Goals: Good  OT Frequency: Min 2X/week    Co-evaluation PT/OT/SLP Co-Evaluation/Treatment: Yes Reason for Co-Treatment: For patient/therapist safety;Necessary to address cognition/behavior during functional activity;To address functional/ADL transfers PT goals  addressed during session: Mobility/safety with mobility;Balance;Strengthening/ROM;Proper use of DME OT goals addressed during session: Strengthening/ROM;ADL's and self-care;Proper use of Adaptive equipment and DME      AM-PAC OT "6 Clicks" Daily Activity     Outcome Measure Help from another person eating meals?: A Little Help from another person taking care of personal grooming?: A Little Help from another person toileting, which includes using toliet, bedpan, or urinal?: A Lot Help from another person bathing (including washing, rinsing, drying)?: A Lot Help from another person to put on and taking off regular upper body clothing?: A Little Help from another person to put on and taking off regular lower body clothing?: A Little 6 Click Score: 3   End of Session Equipment Utilized During Treatment: Gait belt;Rolling walker (2 wheels) Nurse Communication: Mobility status  Activity Tolerance: Patient tolerated treatment well Patient left: in chair;with call bell/phone within reach;with chair alarm set  OT Visit Diagnosis: Unsteadiness on feet (R26.81);Other abnormalities of gait and mobility (R26.89);Muscle weakness (generalized) (M62.81);Other symptoms and signs involving cognitive function;History of falling (Z91.81)                Time: 7673-4193 OT Time Calculation (min): 33 min Charges:  OT General Charges $OT Visit: 1 Visit OT Evaluation $OT Eval Moderate Complexity: [place], OTR/L Acute [employer] [facility] [phone] Office [phone]    Almon Register 05/11/2022, 2:25 PM

## 2022-05-11 NOTE — Progress Notes (Signed)
Pharmacy Antibiotic Note  Autumn Parrish is a 71 y.o. female for which pharmacy has been consulted for cefepime and vancomycin dosing for RLE cellulitis.  Patient with a history of COPD, HTN, hypothyroidism, HF. Patient presenting with AMS & hypotension/bradycardia.  Noted AKI with improving renal function.  SCr 3.12 > 2.14 (baseline 1.2)  WBC 15.8; LA 0.9; T 98.4 F; HR 85; RR 19  Plan: Increase Cefepime 2g q12hr Vancomycin 2000 mg x 1 6/2 8pm  Vancomycin random level 6/4 with AM labs Trend WBC, Fever, Renal function, & Clinical course F/u cultures, clinical course, WBC, fever De-escalate when able  Height: 5' 6.5" (168.9 cm) Weight: 125.4 kg (276 lb 7.3 oz) IBW/kg (Calculated) : 60.45  Temp (24hrs), Avg:98.3 F (36.8 C), Min:97.8 F (36.6 C), Max:98.6 F (37 C)  Recent Labs  Lab 05/10/22 1733 05/10/22 1951 05/10/22 2225 05/10/22 2244 05/11/22 0220  WBC 15.8*  --  10.7*  --  10.3  CREATININE 3.12*  --  2.54*  --  2.14*  LATICACIDVEN  --  0.9  --  1.3  --      Estimated Creatinine Clearance: 33.4 mL/min (A) (by C-G formula based on SCr of 2.14 mg/dL (H)).    Allergies  Allergen Reactions   Ketorolac Anaphylaxis, Swelling and Other (See Comments)    Throat swells   Prochlorperazine Anaphylaxis, Shortness Of Breath, Swelling and Other (See Comments)    Throat swelling   Promethazine Anaphylaxis, Swelling and Other (See Comments)    Throat swells   Sulfa Antibiotics Anaphylaxis   Toradol [Ketorolac Tromethamine] Anaphylaxis   Buprenorphine Nausea Only   Nsaids Nausea Only and Other (See Comments)    NSAIDS upset the stomach- can tolerate ibuprofen     Antimicrobials this admission: cefepime 6/2 >>  vancomycin 6/2 >>  Microbiology results: 6/2 BCx: 6/2 UCx:  Thank you for allowing pharmacy to be a part of this patient's care.  [employer], Pharm.D., BCPS Clinical Pharmacist Clinical phone for 05/11/2022 from 7:30-3:00 is [phone].  **Pharmacist phone  directory can be found on [email or web address] listed under [place].  05/11/2022 10:29 AM

## 2022-05-11 NOTE — Plan of Care (Signed)
  Problem: Education: Goal: Knowledge of General Education information will improve Description Including pain rating scale, medication(s)/side effects and non-pharmacologic comfort measures Outcome: Progressing   

## 2022-05-12 LAB — URINE CULTURE: Culture: NO GROWTH

## 2022-05-12 LAB — CBC
HCT: 34 % — ABNORMAL LOW (ref 36.0–46.0)
Hemoglobin: 10.7 g/dL — ABNORMAL LOW (ref 12.0–15.0)
MCH: 29.2 pg (ref 26.0–34.0)
MCHC: 31.5 g/dL (ref 30.0–36.0)
MCV: 92.6 fL (ref 80.0–100.0)
Platelets: 157 10*3/uL (ref 150–400)
RBC: 3.67 MIL/uL — ABNORMAL LOW (ref 3.87–5.11)
RDW: 14.1 % (ref 11.5–15.5)
WBC: 6.4 10*3/uL (ref 4.0–10.5)
nRBC: 0 % (ref 0.0–0.2)

## 2022-05-12 LAB — BASIC METABOLIC PANEL
Anion gap: 3 — ABNORMAL LOW (ref 5–15)
BUN: 22 mg/dL (ref 8–23)
CO2: 24 mmol/L (ref 22–32)
Calcium: 8.4 mg/dL — ABNORMAL LOW (ref 8.9–10.3)
Chloride: 112 mmol/L — ABNORMAL HIGH (ref 98–111)
Creatinine, Ser: 0.9 mg/dL (ref 0.44–1.00)
GFR, Estimated: >60 mL/min (ref >60)
Glucose, Bld: 106 mg/dL — ABNORMAL HIGH (ref 70–99)
Potassium: 4 mmol/L (ref 3.5–5.1)
Sodium: 139 mmol/L (ref 135–145)

## 2022-05-12 LAB — VANCOMYCIN, RANDOM: Vancomycin Rm: 9 ug/mL

## 2022-05-12 MED ORDER — SODIUM CHLORIDE 0.9 % IV SOLN
2.0000 g | Freq: Three times a day (TID) | INTRAVENOUS | Status: DC
  Administered 2022-05-12: 2 g via INTRAVENOUS
  Filled 2022-05-12: qty 12.5

## 2022-05-12 MED ORDER — VANCOMYCIN HCL 1500 MG/300ML IV SOLN
1500.0000 mg | INTRAVENOUS | Status: DC
  Administered 2022-05-12: 1500 mg via INTRAVENOUS
  Filled 2022-05-12: qty 300

## 2022-05-12 MED ORDER — CEPHALEXIN 500 MG PO CAPS
500.0000 mg | ORAL_CAPSULE | Freq: Three times a day (TID) | ORAL | Status: DC
Start: 2022-05-12 — End: 2022-05-14
  Administered 2022-05-12 – 2022-05-14 (×6): 500 mg via ORAL
  Filled 2022-05-12 (×6): qty 1

## 2022-05-12 NOTE — Progress Notes (Signed)
Pharmacy Antibiotic Note  Autumn Parrish is a 71 y.o. female for which pharmacy has been consulted for cefepime and vancomycin dosing for RLE cellulitis.  Patient with a history of COPD, HTN, hypothyroidism, HF. Patient presenting with AMS & hypotension/bradycardia.  Noted AKI with improving renal function.  SCr 3.12 > 2.14 > 0.9 (baseline 1.2)  WBC 15.8 > 6.4; afeb Vanc random level 9 drawn ~32 hours after loading dose  With improvement in renal fxn to baseline will start Vancomycin standing regimen and increase Cefepime dose.  Plan: Increase Cefepime 2g q8hr Vancomycin '1500mg'$  IV q24h Calculated AUC 463 (goal 400-550) using SCr 0.9 and Vd 0.5 F/u cultures, clinical course, WBC, fever De-escalate when able  Height: 5' 6.5" (168.9 cm) Weight: 128.3 kg (282 lb 13.6 oz) IBW/kg (Calculated) : 60.45  Temp (24hrs), Avg:98.2 F (36.8 C), Min:97.9 F (36.6 C), Max:98.6 F (37 C)  Recent Labs  Lab 05/10/22 1733 05/10/22 1951 05/10/22 2225 05/10/22 2244 05/11/22 0220 05/12/22 0329  WBC 15.8*  --  10.7*  --  10.3 6.4  CREATININE 3.12*  --  2.54*  --  2.14* 0.90  LATICACIDVEN  --  0.9  --  1.3  --   --   VANCORANDOM  --   --   --   --   --  9     Estimated Creatinine Clearance: 80.4 mL/min (by C-G formula based on SCr of 0.9 mg/dL).    Allergies  Allergen Reactions   Ketorolac Anaphylaxis, Swelling and Other (See Comments)    Throat swells   Prochlorperazine Anaphylaxis, Shortness Of Breath, Swelling and Other (See Comments)    Throat swelling   Promethazine Anaphylaxis, Swelling and Other (See Comments)    Throat swells   Sulfa Antibiotics Anaphylaxis   Toradol [Ketorolac Tromethamine] Anaphylaxis   Buprenorphine Nausea Only   Nsaids Nausea Only and Other (See Comments)    NSAIDS upset the stomach- can tolerate ibuprofen     Antimicrobials this admission: cefepime 6/2 >>  vancomycin 6/2 >>  Microbiology results: 6/2 BCx: ngtd 6/2 UCx:   Thank you for allowing  pharmacy to be a part of this patient's care.  [employer], Pharm.D., BCPS Clinical Pharmacist Clinical phone for 05/12/2022 from 7:30-3:00 is [phone].  **Pharmacist phone directory can be found on [email or web address] listed under [place].  05/12/2022 7:26 AM

## 2022-05-12 NOTE — Plan of Care (Signed)
  Problem: Clinical Measurements: Goal: Diagnostic test results will improve Outcome: Progressing   Problem: Clinical Measurements: Goal: Respiratory complications will improve Outcome: Progressing   Problem: Clinical Measurements: Goal: Cardiovascular complication will be avoided Outcome: Progressing   Problem: Activity: Goal: Risk for activity intolerance will decrease Outcome: Progressing

## 2022-05-12 NOTE — NC FL2 (Signed)
[place] LEVEL OF CARE SCREENING TOOL     IDENTIFICATION  Patient Name: Autumn Parrish Birthdate: [birth year] Sex: female Admission Date (Current Location): 05/10/2022  [facility] [place] and [place] Number:      Facility and Address:  [facility], [place] [address] [place], [place] [address]      Provider Number: [id number]  Attending Physician Name and Address:  Shelly Coss, MD  Relative Name and Phone Number:       Current Level of Care: Hospital Recommended Level of Care: [place] Prior Approval Number:    Date Approved/Denied:   PASRR Number: [id number]  Discharge Plan: SNF    Current Diagnoses: Patient Active Problem List   Diagnosis Date Noted   AMS (altered mental status) 05/10/2022   Unilateral primary osteoarthritis, right knee 01/17/2021   Morbid (severe) obesity due to excess calories ([place]) 01/17/2021   Contusion of bone 03/29/2020   Callus 03/29/2020   Pain due to onychomycosis of toenails of both feet 12/28/2019   Laceration of toe 12/28/2019   Typical atrial flutter ([place]) 04/08/2018    Orientation RESPIRATION BLADDER Height & Weight     Self, Time, Situation, Place  O2 (as needed) Incontinent Weight: 282 lb 13.6 oz (128.3 kg) Height:  5' 6.5" (168.9 cm)  BEHAVIORAL SYMPTOMS/MOOD NEUROLOGICAL BOWEL NUTRITION STATUS      Incontinent    AMBULATORY STATUS COMMUNICATION OF NEEDS Skin   Extensive Assist   Normal                       Personal Care Assistance Level of Assistance  Bathing, Dressing, Feeding Bathing Assistance: Limited assistance Feeding assistance: Limited assistance Dressing Assistance: Limited assistance     Functional Limitations Info  Hearing, Sight, Speech Sight Info: Adequate Hearing Info: Impaired Speech Info: Adequate    SPECIAL CARE FACTORS FREQUENCY  PT (By licensed PT), OT (By licensed OT)                    Contractures Contractures Info: Not present     Additional Factors Info  Code Status Code Status Info: FULL CODE             Current Medications (05/12/2022):  This is the current hospital active medication list Current Facility-Administered Medications  Medication Dose Route Frequency Provider Last Rate Last Admin   acetaminophen (TYLENOL) tablet 650 mg  650 mg Oral Q6H PRN Irene Pap N, DO   650 mg at 05/11/22 2102   aspirin EC tablet 81 mg  81 mg Oral Daily Kayleen Memos, DO   81 mg at 05/12/22 0076   cephALEXin (KEFLEX) capsule 500 mg  500 mg Oral Q8H Adhikari, Amrit, MD       citalopram (CELEXA) tablet 20 mg  20 mg Oral Daily Irene Pap N, DO   20 mg at 05/12/22 2263   heparin injection 5,000 Units  5,000 Units Subcutaneous Q8H Irene Pap N, DO   5,000 Units at 05/12/22 3354   levothyroxine (SYNTHROID) tablet 75 mcg  75 mcg Oral QAC breakfast Kayleen Memos, DO   75 mcg at 05/12/22 0654   melatonin tablet 3 mg  3 mg Oral QHS PRN Irene Pap N, DO       ondansetron [facility]) injection 4 mg  4 mg Intravenous Q6H PRN Irene Pap N, DO       oxybutynin (DITROPAN XL) 24 hr tablet 15 mg  15 mg Oral  Daily Irene Pap N, DO   15 mg at 05/12/22 1017   polyethylene glycol (MIRALAX / GLYCOLAX) packet 17 g  17 g Oral Daily PRN Irene Pap N, DO       pravastatin (PRAVACHOL) tablet 80 mg  80 mg Oral Daily Irene Pap N, DO   80 mg at 05/12/22 5789     Discharge Medications: Please see discharge summary for a list of discharge medications.  Relevant Imaging Results:  Relevant Lab Results:   Additional Information SS# [id number]  Amador Cunas, [place]

## 2022-05-12 NOTE — TOC Initial Note (Signed)
Transition of Care [facility]) - Initial/Assessment Note    Patient Details  Name: Autumn Parrish MRN: [id number] Date of Birth: [birth year]  Transition of Care [facility] CM/SW Contact:    Amador Cunas, [place] Phone Number: 05/12/2022, 11:55 AM  Clinical Narrative:   Pt not available at time of visit. SW spoke with pt's sister Izora Gala [phone] re PT recommendation for SNF. Pt's sister reports pt lives alone but their 71 year old mother has to assist pt daily with laundry, cleaning BSC, and other ADLs. Pt with multiple falls in last 6 months and recently had a SNF stay at [facility]. Pt's sister reports pt is aware she will need SNF and pt/family agreeable. Pt has PACE which limits SNF options and will require auth. Weekday SW to f/u with offers and submit auth request.   Wandra Feinstein, MSW, LCSW [phone] (coverage)              Expected Discharge Plan: [place] Barriers to Discharge: SNF Pending bed offer, Insurance Authorization, Continued Medical Work up   Patient Goals and CMS Choice        Expected Discharge Plan and Services Expected Discharge Plan: [place]       Living arrangements for the past 2 months: [place], [place] (recently in SNF for [facility])                                      Prior Living Arrangements/Services Living arrangements for the past 2 months: [place], [place] (recently in SNF for [facility]) Lives with:: Self Patient language and need for interpreter reviewed:: No        Need for Family Participation in Patient Care: Yes (Comment) Care giver support system in place?: No (comment) Current home services: DME Criminal Activity/Legal Involvement Pertinent to Current Situation/Hospitalization: No - Comment as needed  Activities of Daily Living      Permission Sought/Granted Permission sought to share information with : Chartered certified accountant granted to  share information with : Yes, Verbal Permission Granted              Emotional Assessment       Orientation: : Oriented to Self, Oriented to Place, Oriented to  Time, Oriented to Situation (poor judgement) Alcohol / Substance Use: Not Applicable Psych Involvement: No (comment)  Admission diagnosis:  AKI (acute kidney injury) ([place]) [N17.9] Hypotension, unspecified hypotension type [I95.9] Altered mental status, unspecified altered mental status type [R41.82] AMS (altered mental status) [R41.82] Sepsis with acute renal failure, due to unspecified organism, unspecified acute renal failure type, unspecified whether septic shock present ([place]) [A41.9, R65.20, N17.9] Patient Active Problem List   Diagnosis Date Noted   AMS (altered mental status) 05/10/2022   Unilateral primary osteoarthritis, right knee 01/17/2021   Morbid (severe) obesity due to excess calories ([place]) 01/17/2021   Contusion of bone 03/29/2020   Callus 03/29/2020   Pain due to onychomycosis of toenails of both feet 12/28/2019   Laceration of toe 12/28/2019   Typical atrial flutter ([place]) 04/08/2018   PCP:  System, Provider Not In Pharmacy:  No Pharmacies Listed    Social Determinants of Health (SDOH) Interventions    Readmission Risk Interventions     View : No data to display.

## 2022-05-12 NOTE — Progress Notes (Signed)
PROGRESS NOTE  Autumn Parrish  XAJ:[id number] DOB: [birth year] DOA: 05/10/2022 PCP: System, Provider Not In   Brief Narrative:  Patient is a 71 year old female with history of COPD, morbid obesity, hypertension, hypothyroidism, ADHD,DM on Adderall who was sent to the ED by her PCP for the evaluation of altered mental status, hypotension.  She was also bradycardic in the low 40s.  Infectious process was suspected.  CT chest/abdomen/pelvis did not show any acute findings.  Lab work showed creatinine of 3.12, baseline creatinine 1.2, leukocytosis.  Right lower extremity was noted to be erythematous, edematous and tender.  Patient was admitted for the management of acute metabolic encephalopathy secondary to sepsis from right lower extremity cellulitis, AKI.  She was hypotensive on presentation and was started on IV fluids.  Currently hemodynamically stable.  She is medically stable for discharge to skilled nursing facility as soon as bed is available.  Assessment & Plan:  Principal Problem:   AMS (altered mental status)  Acute metabolic encephalopathy/dementia:  CT head did not show any acute intracranial abnormalities. H/O TBI 20 years ago,has recently got confused as per sister since last few months. Falling frequently at home.[facility],now living at apartment,walks with walker/motorised scooter.  I think the confusion is ongoing problem and cannot be fixed.  MRI of the brain showed no acute intracranial abnormality,generalized age-related cerebral atrophy with mild chronic small vessel ischemic disease, with a few scattered remote lacunar infarcts about the bilateral basal ganglia, chronic foci of encephalomalacia. Continue supportive care  Sepsis secondary to right lower extremity cellulitis:Presented with hypotension, leukocytosis.  Right lower extremity noted to be edematous, tender, erythematous.  Blood cultures remain negative.  Antibiotics changed to  oral.  AKI on CKD stage IIIb: Baseline creatinine of 1.2-1.3.  Presented with creatinine in the range of 3.  Likely from dehydration, hypotension.  Kidney function currently normal after IV fluids given.  Fluids stopped.  History of diastolic CHF: Last echo on 06/2018 showed EF of 60 to 09%, grade 1 diastolic dysfunction.  Probably she was dehydrated on presentation.  She takes Lasix 40 mg daily at home.   Abnormal CT imaging: CT imaging showed bilateral low-attenuation adrenal masses which likely represent adrenal adenomas.  We recommend outpatient follow-up with adrenal washout CT or chemical shift MRI.  Hypertension: Home antihypertensives on hold due to soft blood pressure.  Will resume if needed  Hypothyroidism: Continue levothyroxine  Anxiety/depression: On Celexa at home.  Hyperlipidemia: On pravastatin  Morbid obesity: BMI 43  Debility/deconditioning: PT/OT recommending/SNF on discharge.  TOC consulted       DVT prophylaxis:heparin injection 5,000 Units Start: 05/10/22 2200     Code Status: Full Code  Family Communication: Called and discussed with sister Peggy on phone on 6/3  Patient status:Inpatient  Patient is from :Home  Anticipated discharge to:SNF  Estimated DC date: As soon as bed is available  Consultants: None  Procedures: None  Antimicrobials:  Anti-infectives (From admission, onward)    Start     Dose/Rate Route Frequency Ordered Stop   05/12/22 0900  vancomycin (VANCOREADY) IVPB 1500 mg/300 mL        1,500 mg 150 mL/hr over 120 Minutes Intravenous Every 24 hours 05/12/22 0731     05/12/22 0830  ceFEPIme (MAXIPIME) 2 g in sodium chloride 0.9 % 100 mL IVPB        2 g 200 mL/hr over 30 Minutes Intravenous Every 8 hours 05/12/22 0731     05/11/22  1900  ceFEPIme (MAXIPIME) 2 g in sodium chloride 0.9 % 100 mL IVPB  Status:  Discontinued        2 g 200 mL/hr over 30 Minutes Intravenous Every 24 hours 05/10/22 2119 05/11/22 1032   05/11/22 1130   ceFEPIme (MAXIPIME) 2 g in sodium chloride 0.9 % 100 mL IVPB  Status:  Discontinued        2 g 200 mL/hr over 30 Minutes Intravenous Every 12 hours 05/11/22 1032 05/12/22 0731   05/10/22 2345  vancomycin (VANCOREADY) IVPB 500 mg/100 mL  Status:  Discontinued        500 mg 100 mL/hr over 60 Minutes Intravenous Every 24 hours 05/10/22 2255 05/10/22 2257   05/10/22 2119  vancomycin variable dose per unstable renal function (pharmacist dosing)  Status:  Discontinued         Does not apply See admin instructions 05/10/22 2119 05/12/22 0731   05/10/22 1845  vancomycin (VANCOREADY) IVPB 2000 mg/400 mL        2,000 mg 200 mL/hr over 120 Minutes Intravenous  Once 05/10/22 1832 05/10/22 2133   05/10/22 1845  ceFEPIme (MAXIPIME) 2 g in sodium chloride 0.9 % 100 mL IVPB        2 g 200 mL/hr over 30 Minutes Intravenous  Once 05/10/22 1832 05/10/22 1928       Subjective: Patient seen and examined at the bedside this morning.  Hemodynamically stable.  She is more alert and coherant  today.  She is oriented to place but not time .  Does not look in any kind of distress.  Lying in bed.  Right lower extremity cellulitis looks resolved.  objective: Vitals:   05/11/22 2234 05/12/22 0500 05/12/22 0530 05/12/22 0950  BP: (!) 100/57  105/64 110/66  Pulse: (!) 51  (!) 48 (!) 51  Resp: '18  19 18  '$ Temp: 98.3 F (36.8 C)  97.9 F (36.6 C) 97.6 F (36.4 C)  TempSrc: Oral  Oral Oral  SpO2: 95%  94% 95%  Weight:  128.3 kg    Height:        Intake/Output Summary (Last 24 hours) at 05/12/2022 1139 Last data filed at 05/12/2022 1110 Gross per 24 hour  Intake 2673.98 ml  Output 3350 ml  Net -676.02 ml   Filed Weights   05/10/22 2221 05/12/22 0500  Weight: 125.4 kg 128.3 kg    Examination:  General exam: Overall comfortable, not in distress,morbidly obese HEENT: PERRL Respiratory system:  no wheezes or crackles  Cardiovascular system: S1 & S2 heard, RRR.  Gastrointestinal system: Abdomen is  nondistended, soft and nontender. Central nervous system: Alert and awake,oriented to place only Extremities: No edema, no clubbing ,no cyanosis Skin: No rashes, no ulcers,no icterus     Data Reviewed: I have personally reviewed following labs and imaging studies  CBC: Recent Labs  Lab 05/10/22 1733 05/10/22 1802 05/10/22 2225 05/11/22 0220 05/12/22 0329  WBC 15.8*  --  10.7* 10.3 6.4  NEUTROABS 9.0*  --   --  6.3  --   HGB 11.3* 11.6* 10.8* 10.7* 10.7*  HCT 36.8 34.0* 35.1* 34.0* 34.0*  MCV 95.1  --  93.1 93.4 92.6  PLT 192  --  158 140* 517   Basic Metabolic Panel: Recent Labs  Lab 05/10/22 1733 05/10/22 1802 05/10/22 2225 05/11/22 0220 05/12/22 0329  NA 140 140  --  142 139  K 3.6 3.5  --  3.9 4.0  CL 110  --   --  112* 112*  CO2 20*  --   --  23 24  GLUCOSE 124*  --   --  99 106*  BUN 42*  --   --  38* 22  CREATININE 3.12*  --  2.54* 2.14* 0.90  CALCIUM 8.0*  --   --  8.0* 8.4*  MG 2.0  --   --  1.9  --   PHOS  --   --   --  5.2*  --      Recent Results (from the past 240 hour(s))  Blood Culture (routine x 2)     Status: None (Preliminary result)   Collection Time: 05/10/22  6:31 PM   Specimen: BLOOD  Result Value Ref Range Status   Specimen Description BLOOD BLOOD LEFT FOREARM  Final   Special Requests   Final    BOTTLES DRAWN AEROBIC AND ANAEROBIC Blood Culture results may not be optimal due to an inadequate volume of blood received in culture bottles   Culture   Final    NO GROWTH 2 DAYS Performed at [facility], [place] [address] [place], [place] [address]    Report Status PENDING  Incomplete  Urine Culture     Status: None   Collection Time: 05/10/22  6:31 PM   Specimen: In/Out Cath Urine  Result Value Ref Range Status   Specimen Description IN/OUT CATH URINE  Final   Special Requests NONE  Final   Culture   Final    NO GROWTH Performed at [facility], [place] [address], [place], [place] [address]    Report Status 05/12/2022 FINAL   Final  Blood Culture (routine x 2)     Status: None (Preliminary result)   Collection Time: 05/10/22 10:25 PM   Specimen: BLOOD RIGHT HAND  Result Value Ref Range Status   Specimen Description BLOOD RIGHT HAND  Final   Special Requests   Final    BOTTLES DRAWN AEROBIC ONLY Blood Culture adequate volume   Culture   Final    NO GROWTH 2 DAYS Performed at [facility], [place] [address] [place], [place] [address]    Report Status PENDING  Incomplete     Radiology Studies: CT Head Wo Contrast  Result Date: 05/10/2022 CLINICAL DATA:  Mental status change, unknown cause EXAM: CT HEAD WITHOUT CONTRAST TECHNIQUE: Contiguous axial images were obtained from the base of the skull through the vertex without intravenous contrast. RADIATION DOSE REDUCTION: This exam was performed according to the departmental dose-optimization program which includes automated exposure control, adjustment of the mA and/or kV according to patient size and/or use of iterative reconstruction technique. COMPARISON:  None Available. BRAIN: BRAIN Patchy and confluent areas of decreased attenuation are noted throughout the deep and periventricular white matter of the cerebral hemispheres bilaterally, compatible with chronic microvascular ischemic disease. No evidence of large-territorial acute infarction. No parenchymal hemorrhage. No mass lesion. No extra-axial collection. No mass effect or midline shift. No hydrocephalus. Basilar cisterns are patent. Vascular: No hyperdense vessel. Skull: Limited evaluation for skull base fracture due to motion artifact. Sinuses/Orbits: Paranasal sinuses and mastoid air cells are clear. The orbits are unremarkable. Other: None. IMPRESSION: No acute intracranial abnormality. Electronically Signed   By: Iven Finn M.D.   On: 05/10/2022 19:30   MR BRAIN WO CONTRAST  Result Date: 05/12/2022 CLINICAL DATA:  Initial evaluation for delirium. EXAM: MRI HEAD WITHOUT CONTRAST TECHNIQUE: Multiplanar,  multiecho pulse sequences of the brain and surrounding structures were obtained  without intravenous contrast. COMPARISON:  CT from 05/10/2022. FINDINGS: Brain: Diffuse prominence of the CSF containing spaces compatible with generalized cerebral atrophy. Scattered patchy T2/FLAIR hyperintensity involving the periventricular and deep white matter both cerebral hemispheres, most likely related chronic microvascular ischemic disease, mild in nature. Remote lacunar infarcts present about the bilateral basal ganglia. Symmetric foci of encephalomalacia involving the globus palladi could be related to remote ischemia or other insult. Similar changes involving the midbrain and cerebellum could also be related to prior ischemia or other insult. No evidence for acute or subacute ischemia. Gray-white matter differentiation otherwise maintained. No areas of chronic cortical infarction. No acute or chronic intracranial blood products. No mass lesion, mass effect or midline shift. No hydrocephalus or extra-axial fluid collection. Pituitary gland suprasellar region within normal limits. Vascular: Major intracranial vascular flow voids are maintained. Skull and upper cervical spine: Cranial junction within normal limits. Bone marrow signal intensity normal. No scalp soft tissue abnormality. Sinuses/Orbits: Globes orbital soft tissues within normal limits. Mild scattered mucosal thickening about the ethmoidal air cells. No significant mastoid effusion. Other: None. IMPRESSION: 1. No acute intracranial abnormality. 2. Generalized age-related cerebral atrophy with mild chronic small vessel ischemic disease, with a few scattered remote lacunar infarcts about the bilateral basal ganglia. Additional chronic foci of encephalomalacia involving the globus palladi, midbrain, and cerebellum could be related to prior ischemia or other insult. Electronically Signed   By: Jeannine Boga M.D.   On: 05/12/2022 00:59   DG Chest Portable 1  View  Result Date: 05/10/2022 CLINICAL DATA:  Altered mental status, hypotension EXAM: PORTABLE CHEST 1 VIEW COMPARISON:  04/30/2022 FINDINGS: Transverse diameter of heart is increased. Central pulmonary vessels are more prominent. Increased interstitial markings are seen in the both lungs, more so on the left side. Apparent shift of mediastinum to the right may be due to rotation. Lateral costophrenic angles are indistinct. There is no pneumothorax. IMPRESSION: Cardiomegaly. There is prominence of central pulmonary vessels and increased interstitial and alveolar markings in both lungs suggesting pulmonary edema with CHF. Possibility of underlying pneumonia is not excluded. Possible small pleural effusions, more so on the left side. Electronically Signed   By: Elmer Picker M.D.   On: 05/10/2022 18:25   CT CHEST ABDOMEN PELVIS WO CONTRAST  Result Date: 05/10/2022 CLINICAL DATA:  Bradycardia and hypotension. EXAM: CT CHEST, ABDOMEN AND PELVIS WITHOUT CONTRAST TECHNIQUE: Multidetector CT imaging of the chest, abdomen and pelvis was performed following the standard protocol without IV contrast. RADIATION DOSE REDUCTION: This exam was performed according to the departmental dose-optimization program which includes automated exposure control, adjustment of the mA and/or kV according to patient size and/or use of iterative reconstruction technique. COMPARISON:  Apr 30, 2022 and September 03, 2021 FINDINGS: CT CHEST FINDINGS Cardiovascular: There is mild calcification of the aortic arch, without evidence of aortic aneurysm. A 9 mm thick crescentic shaped area of fluid attenuation (approximately 11.65 Hounsfield units) is seen within the right and anterior aspect of the superior aortic recess. This is mildly increased in size on the current exam and likely represents pericardial fluid. Normal heart size. Mediastinum/Nodes: No enlarged mediastinal, hilar, or axillary lymph nodes. Thyroid gland, trachea, and  esophagus demonstrate no significant findings. Lungs/Pleura: Evaluation of the lung parenchyma is limited secondary to patient motion. Mild diffuse thickening of the interstitium is noted with mild atelectatic changes seen within the posterior aspects of the bilateral upper lobes and bilateral lung bases. Musculoskeletal: Multilevel degenerative changes seen throughout the thoracic spine. CT ABDOMEN  PELVIS FINDINGS Hepatobiliary: No focal liver abnormality is seen. Status post cholecystectomy. No biliary dilatation. Pancreas: Unremarkable. No pancreatic ductal dilatation or surrounding inflammatory changes. Spleen: Normal in size without focal abnormality. Adrenals/Urinary Tract: A 2.8 cm x 2.0 cm homogeneous low-attenuation left adrenal mass is seen (approximately 17.50 Hounsfield units). A 2.3 cm x 1.6 cm heterogeneous low-attenuation right adrenal mass is also noted (approximately 3.93 Hounsfield units). Kidneys are normal, without renal calculi, focal lesion, or hydronephrosis. Bladder is unremarkable. Stomach/Bowel: Stomach is within normal limits. Appendix appears normal. No evidence of bowel wall thickening, distention, or inflammatory changes. Noninflamed diverticula are seen throughout the large bowel. Vascular/Lymphatic: Aortic atherosclerosis. No enlarged abdominal or pelvic lymph nodes. Reproductive: Status post hysterectomy. No adnexal masses. Other: No abdominal wall hernia or abnormality. No abdominopelvic ascites. Musculoskeletal: Multilevel degenerative changes are seen throughout the lumbar spine. IMPRESSION: 1. Mild diffuse thickening of the interstitium with mild bilateral upper lobe and bibasilar atelectasis. 2. Bilateral low-attenuation adrenal masses which likely represent adrenal adenomas. Recommend adrenal washout CT or chemical shift MRI. JACR 2017 Aug; 14(8):1038-44, JCAT 2016 Mar-Apr; 40(2):194-200, Urol J 2006 Spring; 3(2):71-4. 3. Colonic diverticulosis. 4. Aortic atherosclerosis.  Aortic Atherosclerosis (ICD10-I70.0). Electronically Signed   By: Virgina Norfolk M.D.   On: 05/10/2022 20:06    Scheduled Meds:  aspirin EC  81 mg Oral Daily   citalopram  20 mg Oral Daily   heparin  5,000 Units Subcutaneous Q8H   levothyroxine  75 mcg Oral QAC breakfast   oxybutynin  15 mg Oral Daily   pravastatin  80 mg Oral Daily   Continuous Infusions:  ceFEPime (MAXIPIME) IV 2 g (05/12/22 0811)   vancomycin 1,500 mg (05/12/22 0926)     LOS: 2 days   Shelly Coss, MD [facility] P6/03/2022, 11:39 AM

## 2022-05-13 NOTE — Progress Notes (Signed)
PROGRESS NOTE  Autumn Parrish  DXA:[id number] DOB: 1951-04-03 DOA: 05/10/2022 PCP: System, Provider Not In   Brief Narrative:  Patient is a 71 year old female with history of COPD, morbid obesity, hypertension, hypothyroidism, ADHD,DM on Adderall who was sent to the ED by her PCP for the evaluation of altered mental status, hypotension.  She was also bradycardic in the low 40s.  Infectious process was suspected.  CT chest/abdomen/pelvis did not show any acute findings.  Lab work showed creatinine of 3.12, baseline creatinine 1.2, leukocytosis.  Right lower extremity was noted to be erythematous, edematous and tender.  Patient was admitted for the management of acute metabolic encephalopathy secondary to sepsis from right lower extremity cellulitis, AKI.  She was hypotensive on presentation and was started on IV fluids.  Currently hemodynamically stable.  She is medically stable for discharge to skilled nursing facility as soon as bed is available.  Assessment & Plan:  Principal Problem:   AMS (altered mental status)  Acute metabolic encephalopathy/dementia:  CT head did not show any acute intracranial abnormalities. H/O TBI 20 years ago,has recently got confused as per sister since last few months. Falling frequently at home.[facility],now living at apartment,walks with walker/motorised scooter.  I think the confusion is ongoing problem and cannot be fixed.  MRI of the brain showed no acute intracranial abnormality,generalized age-related cerebral atrophy with mild chronic small vessel ischemic disease, with a few scattered remote lacunar infarcts about the bilateral basal ganglia, chronic foci of encephalomalacia.Continue supportive care  Sepsis secondary to right lower extremity cellulitis:Presented with hypotension, leukocytosis.  Right lower extremity noted to be edematous, tender, erythematous.  Blood cultures remain negative.  Antibiotics changed to  oral.  AKI on CKD stage IIIb: Baseline creatinine of 1.2-1.3.  Presented with creatinine in the range of 3.  Likely from dehydration, hypotension.  Kidney function currently normal after IV fluids given.  Fluids stopped.  History of diastolic CHF: Last echo on 06/2018 showed EF of 60 to 20%, grade 1 diastolic dysfunction.  Probably she was dehydrated on presentation.  She takes Lasix 40 mg daily at home.   Abnormal CT imaging: CT imaging showed bilateral low-attenuation adrenal masses which likely represent adrenal adenomas.  We recommend outpatient follow-up with adrenal washout CT or chemical shift MRI.  Hypertension: Home antihypertensives on hold due to soft blood pressure.  Will resume if needed  Hypothyroidism: Continue levothyroxine  Anxiety/depression: On Celexa at home.  Hyperlipidemia: On pravastatin  Morbid obesity: BMI 43  Debility/deconditioning: PT/OT recommending/SNF on discharge.  TOC consulted       DVT prophylaxis:heparin injection 5,000 Units Start: 05/10/22 2200     Code Status: Full Code  Family Communication: Called and discussed with sister Vickii Chafe on phone on 6/3  Patient status:Inpatient  Patient is from :Home  Anticipated discharge to:SNF  Estimated DC date: As soon as bed is available  Consultants: None  Procedures: None  Antimicrobials:  Anti-infectives (From admission, onward)    Start     Dose/Rate Route Frequency Ordered Stop   05/12/22 1400  cephALEXin (KEFLEX) capsule 500 mg        500 mg Oral Every 8 hours 05/12/22 1140 05/17/22 1359   05/12/22 0900  vancomycin (VANCOREADY) IVPB 1500 mg/300 mL  Status:  Discontinued        1,500 mg 150 mL/hr over 120 Minutes Intravenous Every 24 hours 05/12/22 0731 05/12/22 1140   05/12/22 0830  ceFEPIme (MAXIPIME) 2 g in sodium chloride  0.9 % 100 mL IVPB  Status:  Discontinued        2 g 200 mL/hr over 30 Minutes Intravenous Every 8 hours 05/12/22 0731 05/12/22 1140   05/11/22 1900  ceFEPIme  (MAXIPIME) 2 g in sodium chloride 0.9 % 100 mL IVPB  Status:  Discontinued        2 g 200 mL/hr over 30 Minutes Intravenous Every 24 hours 05/10/22 2119 05/11/22 1032   05/11/22 1130  ceFEPIme (MAXIPIME) 2 g in sodium chloride 0.9 % 100 mL IVPB  Status:  Discontinued        2 g 200 mL/hr over 30 Minutes Intravenous Every 12 hours 05/11/22 1032 05/12/22 0731   05/10/22 2345  vancomycin (VANCOREADY) IVPB 500 mg/100 mL  Status:  Discontinued        500 mg 100 mL/hr over 60 Minutes Intravenous Every 24 hours 05/10/22 2255 05/10/22 2257   05/10/22 2119  vancomycin variable dose per unstable renal function (pharmacist dosing)  Status:  Discontinued         Does not apply See admin instructions 05/10/22 2119 05/12/22 0731   05/10/22 1845  vancomycin (VANCOREADY) IVPB 2000 mg/400 mL        2,000 mg 200 mL/hr over 120 Minutes Intravenous  Once 05/10/22 1832 05/10/22 2133   05/10/22 1845  ceFEPIme (MAXIPIME) 2 g in sodium chloride 0.9 % 100 mL IVPB        2 g 200 mL/hr over 30 Minutes Intravenous  Once 05/10/22 1832 05/10/22 1928       Subjective: Patient seen and examined at bedside this morning.  Hemodynamically stable.  Lying in bed.  Oriented to place only but coherant and obeys command.  Not in any kind of distress.  objective: Vitals:   05/12/22 1624 05/12/22 2044 05/13/22 0547 05/13/22 0853  BP: (!) 142/71 (!) 136/57 101/85 (!) 124/42  Pulse: (!) 56 (!) 58 66 65  Resp: '18 16 16 16  '$ Temp: 99.1 F (37.3 C) 98.2 F (36.8 C) 99.3 F (37.4 C) 98.9 F (37.2 C)  TempSrc: Oral Oral Oral Oral  SpO2: 98% 96% 98% 96%  Weight:   127.6 kg   Height:        Intake/Output Summary (Last 24 hours) at 05/13/2022 1143 Last data filed at 05/13/2022 0845 Gross per 24 hour  Intake 1380 ml  Output 2100 ml  Net -720 ml   Filed Weights   05/10/22 2221 05/12/22 0500 05/13/22 0547  Weight: 125.4 kg 128.3 kg 127.6 kg    Examination:  General exam: Overall comfortable, not in distress, morbidly  obese HEENT: PERRL Respiratory system:  no wheezes or crackles  Cardiovascular system: S1 & S2 heard, RRR.  Gastrointestinal system: Abdomen is nondistended, soft and nontender. Central nervous system: Alert and awake, oriented to place: Extremities: No edema, no clubbing ,no cyanosis Skin: No rashes, no ulcers,no icterus     Data Reviewed: I have personally reviewed following labs and imaging studies  CBC: Recent Labs  Lab 05/10/22 1733 05/10/22 1802 05/10/22 2225 05/11/22 0220 05/12/22 0329  WBC 15.8*  --  10.7* 10.3 6.4  NEUTROABS 9.0*  --   --  6.3  --   HGB 11.3* 11.6* 10.8* 10.7* 10.7*  HCT 36.8 34.0* 35.1* 34.0* 34.0*  MCV 95.1  --  93.1 93.4 92.6  PLT 192  --  158 140* 409   Basic Metabolic Panel: Recent Labs  Lab 05/10/22 1733 05/10/22 1802 05/10/22 2225 05/11/22 0220 05/12/22 0329  NA 140 140  --  142 139  K 3.6 3.5  --  3.9 4.0  CL 110  --   --  112* 112*  CO2 20*  --   --  23 24  GLUCOSE 124*  --   --  99 106*  BUN 42*  --   --  38* 22  CREATININE 3.12*  --  2.54* 2.14* 0.90  CALCIUM 8.0*  --   --  8.0* 8.4*  MG 2.0  --   --  1.9  --   PHOS  --   --   --  5.2*  --      Recent Results (from the past 240 hour(s))  Blood Culture (routine x 2)     Status: None (Preliminary result)   Collection Time: 05/10/22  6:31 PM   Specimen: BLOOD  Result Value Ref Range Status   Specimen Description BLOOD BLOOD LEFT FOREARM  Final   Special Requests   Final    BOTTLES DRAWN AEROBIC AND ANAEROBIC Blood Culture results may not be optimal due to an inadequate volume of blood received in culture bottles   Culture   Final    NO GROWTH 3 DAYS Performed at [facility], [place] [address], [place], [place] [address]    Report Status PENDING  Incomplete  Urine Culture     Status: None   Collection Time: 05/10/22  6:31 PM   Specimen: In/Out Cath Urine  Result Value Ref Range Status   Specimen Description IN/OUT CATH URINE  Final   Special Requests NONE  Final    Culture   Final    NO GROWTH Performed at [facility], [place] [address], [place], [place] [address]    Report Status 05/12/2022 FINAL  Final  Blood Culture (routine x 2)     Status: None (Preliminary result)   Collection Time: 05/10/22 10:25 PM   Specimen: BLOOD RIGHT HAND  Result Value Ref Range Status   Specimen Description BLOOD RIGHT HAND  Final   Special Requests   Final    BOTTLES DRAWN AEROBIC ONLY Blood Culture adequate volume   Culture   Final    NO GROWTH 3 DAYS Performed at [facility], [place] [address], [place], [place] [address]    Report Status PENDING  Incomplete     Radiology Studies: MR BRAIN WO CONTRAST  Result Date: 05/12/2022 CLINICAL DATA:  Initial evaluation for delirium. EXAM: MRI HEAD WITHOUT CONTRAST TECHNIQUE: Multiplanar, multiecho pulse sequences of the brain and surrounding structures were obtained without intravenous contrast. COMPARISON:  CT from 05/10/2022. FINDINGS: Brain: Diffuse prominence of the CSF containing spaces compatible with generalized cerebral atrophy. Scattered patchy T2/FLAIR hyperintensity involving the periventricular and deep white matter both cerebral hemispheres, most likely related chronic microvascular ischemic disease, mild in nature. Remote lacunar infarcts present about the bilateral basal ganglia. Symmetric foci of encephalomalacia involving the globus palladi could be related to remote ischemia or other insult. Similar changes involving the midbrain and cerebellum could also be related to prior ischemia or other insult. No evidence for acute or subacute ischemia. Gray-white matter differentiation otherwise maintained. No areas of chronic cortical infarction. No acute or chronic intracranial blood products. No mass lesion, mass effect or midline shift. No hydrocephalus or extra-axial fluid collection. Pituitary gland suprasellar region within normal limits. Vascular: Major intracranial vascular flow voids are maintained. Skull  and upper cervical spine: Cranial junction within normal limits. Bone marrow signal intensity normal.  No scalp soft tissue abnormality. Sinuses/Orbits: Globes orbital soft tissues within normal limits. Mild scattered mucosal thickening about the ethmoidal air cells. No significant mastoid effusion. Other: None. IMPRESSION: 1. No acute intracranial abnormality. 2. Generalized age-related cerebral atrophy with mild chronic small vessel ischemic disease, with a few scattered remote lacunar infarcts about the bilateral basal ganglia. Additional chronic foci of encephalomalacia involving the globus palladi, midbrain, and cerebellum could be related to prior ischemia or other insult. Electronically Signed   By: Jeannine Boga M.D.   On: 05/12/2022 00:59    Scheduled Meds:  aspirin EC  81 mg Oral Daily   cephALEXin  500 mg Oral Q8H   citalopram  20 mg Oral Daily   heparin  5,000 Units Subcutaneous Q8H   levothyroxine  75 mcg Oral QAC breakfast   oxybutynin  15 mg Oral Daily   pravastatin  80 mg Oral Daily   Continuous Infusions:     LOS: 3 days   Shelly Coss, MD [facility] P6/04/2022, 11:43 AM

## 2022-05-13 NOTE — TOC Progression Note (Signed)
Transition of Care [facility]) - Progression Note    Patient Details  Name: BREONIA KIRSTEIN MRN: [id number] Date of Birth: May 17, 1951  Transition of [facility]) CM/SW [place], [place] Phone Number: 05/13/2022, 12:19 PM  Clinical Narrative:    CSW spoke with pt's [facility] this morning who stated she had spoken with family and they are interested in pt going back to [place]. They had reached out to facility, but had not heard back yet. CSW reached out and was told she was being reviewed and they would follow up if they could take pt today. CSW got a call back from Graham with [facility] stating that [facility] had said they might be able to take tomorrow. CSW confirmed with Helene Kelp that they should have a bed tomorrow. TOC will continue to follow for DC needs.   Expected Discharge Plan: Skilled Nursing Facility Barriers to Discharge: SNF Pending bed offer, Insurance Authorization, Continued Medical Work up  Expected Discharge Plan and Services Expected Discharge Plan: [place] arrangements for the past 2 months: [place] (recently in SNF for [facility])                                       Social Determinants of Health (SDOH) Interventions    Readmission Risk Interventions     View : No data to display.

## 2022-05-13 NOTE — Care Management Important Message (Signed)
Important Message  Patient Details  Name: Autumn Parrish MRN: [id number] Date of Birth: [birth year]   Medicare Important Message Given:  Yes     Orbie Pyo 05/13/2022, 3:47 PM

## 2022-05-13 NOTE — Progress Notes (Signed)
Physical Therapy Treatment Patient Details Name: Autumn Parrish MRN: [id number] DOB: August 05, 1951 Today's Date: 05/13/2022   History of Present Illness 71 y/o female presented to ED on 05/10/22 for AMS, bradycardia, and hypotension. R LE erythematous and edematous in the ED. Admitted for acute metabolic encephalopathy and R LE cellulitis. PMH: COPD, severe morbid obesity, HTN, ADHD.    PT Comments    Pt was seen for mobility on RW to stand and sidestep, then to walk forward and back on the RW.  Pt requires help to maneuver with RLE to sit, but can stand better with each repetition.  Pt is still recommended to SNF due to her depth of deficits, her ROM loss on R knee and her history of bradycardia and hypotension.  She requires significant help to maneuver her walker and remain in safe posture, and will benefit from an inpt stay for recovery of safety and independence to go home with her significant other.   Recommendations for follow up therapy are one component of a multi-disciplinary discharge planning process, led by the attending physician.  Recommendations may be updated based on patient status, additional functional criteria and insurance authorization.  Follow Up Recommendations  Skilled nursing-short term rehab (<3 hours/day)     Assistance Recommended at Discharge Frequent or constant Supervision/Assistance  Patient can return home with the following A lot of help with walking and/or transfers;A lot of help with bathing/dressing/bathroom;Assistance with cooking/housework;Direct supervision/assist for medications management;Direct supervision/assist for financial management;Assist for transportation   Equipment Recommendations  Rolling walker (2 wheels)    Recommendations for Other Services       Precautions / Restrictions Precautions Precautions: Fall Restrictions Weight Bearing Restrictions: No     Mobility  Bed Mobility Overal bed mobility: Needs Assistance Bed Mobility:  Supine to Sit     Supine to sit: Min guard     General bed mobility comments: pt using bedrail and HOB elevated    Transfers Overall transfer level: Needs assistance Equipment used: Rolling walker (2 wheels) Transfers: Sit to/from Stand Sit to Stand: Min guard, Min assist           General transfer comment: cannot assist on R UE and on LUE is much less help, R knee extended permanently    Ambulation/Gait Ambulation/Gait assistance: Min assist, Min guard Gait Distance (Feet): 35 Feet (5+15+15) Assistive device: Rolling walker (2 wheels) Gait Pattern/deviations: Step-through pattern, Decreased stride length, Wide base of support Gait velocity: decreased Gait velocity interpretation: <1.31 ft/sec, indicative of household ambulator Pre-gait activities: standing balance and walker use training General Gait Details: requires frequent cues for management of walker initially but then more consistent to follow through, cannot let go or not prompt her at all   Stairs             Wheelchair Mobility    Modified Rankin (Stroke Patients Only)       Balance Overall balance assessment: Needs assistance Sitting-balance support: Feet supported Sitting balance-Leahy Scale: Fair Sitting balance - Comments: RLE is not functionally assisting sitting control Postural control: Right lateral lean Standing balance support: Bilateral upper extremity supported Standing balance-Leahy Scale: Poor Standing balance comment: requires help to move RLE out for sitting                            Cognition Arousal/Alertness: Awake/alert Behavior During Therapy: Impulsive Overall Cognitive Status: Impaired/Different from baseline Area of Impairment: Problem solving, Awareness, Safety/judgement, Following commands, Memory, Attention,  Orientation                 Orientation Level: Place, Time, Situation Current Attention Level: Selective Memory: Decreased recall of  precautions, Decreased short-term memory Following Commands: Follows one step commands inconsistently, Follows one step commands with increased time Safety/Judgement: Decreased awareness of safety, Decreased awareness of deficits Awareness: Intellectual Problem Solving: Slow processing, Requires verbal cues, Requires tactile cues General Comments: RW requires repetitive information        Exercises      General Comments General comments (skin integrity, edema, etc.): Pt is weak and mildly struggling to sequence and maneuver but per last PT note is improved      Pertinent Vitals/Pain Pain Assessment Pain Assessment: Faces Faces Pain Scale: Hurts little more Pain Location: back Pain Descriptors / Indicators: Guarding Pain Intervention(s): Limited activity within patient's tolerance, Monitored during session, Repositioned    Home Living                          Prior Function            PT Goals (current goals can now be found in the care plan section) Acute Rehab PT Goals Patient Stated Goal: none stated Progress towards PT goals: Progressing toward goals    Frequency    Min 2X/week      PT Plan Current plan remains appropriate    Co-evaluation              AM-PAC PT "6 Clicks" Mobility   Outcome Measure  Help needed turning from your back to your side while in a flat bed without using bedrails?: A Little Help needed moving from lying on your back to sitting on the side of a flat bed without using bedrails?: A Little Help needed moving to and from a bed to a chair (including a wheelchair)?: A Little Help needed standing up from a chair using your arms (e.g., wheelchair or bedside chair)?: A Lot Help needed to walk in hospital room?: A Lot Help needed climbing 3-5 steps with a railing? : Total 6 Click Score: 14    End of Session Equipment Utilized During Treatment: Gait belt Activity Tolerance: Patient tolerated treatment well Patient left:  in chair;with call bell/phone within reach;with chair alarm set Nurse Communication: Mobility status PT Visit Diagnosis: Unsteadiness on feet (R26.81);Muscle weakness (generalized) (M62.81);Repeated falls (R29.6);Difficulty in walking, not elsewhere classified (R26.2)     Time: 4132-4401 PT Time Calculation (min) (ACUTE ONLY): 14 min  Charges:  $Gait Training: 8-22 mins       Ramond Dial 05/13/2022, 12:41 PM  Mee Hives, PT PhD Acute Rehab Dept. Number: [place] [facility] [place]

## 2022-05-13 NOTE — Plan of Care (Signed)

## 2022-05-14 MED ORDER — AMPHETAMINE-DEXTROAMPHETAMINE 30 MG PO TABS: 30.0000 mg | ORAL_TABLET | Freq: Every day | ORAL | 0 refills | Status: DC

## 2022-05-14 MED ORDER — METOPROLOL SUCCINATE ER 25 MG PO TB24: 25.0000 mg | ORAL_TABLET | Freq: Every day | ORAL | Status: AC

## 2022-05-14 MED ORDER — POLYETHYLENE GLYCOL 3350 17 G PO PACK: 17.0000 g | PACK | Freq: Every day | ORAL | 0 refills | Status: DC | PRN

## 2022-05-14 MED ORDER — AMPHETAMINE-DEXTROAMPHETAMINE 30 MG PO TABS: 30.0000 mg | ORAL_TABLET | Freq: Two times a day (BID) | ORAL | Status: DC

## 2022-05-14 MED ORDER — AMPHETAMINE-DEXTROAMPHETAMINE 30 MG PO TABS: 30.0000 mg | ORAL_TABLET | Freq: Two times a day (BID) | ORAL | 0 refills | Status: AC

## 2022-05-14 MED ORDER — AMPHETAMINE-DEXTROAMPHETAMINE 10 MG PO TABS
30.0000 mg | ORAL_TABLET | Freq: Every day | ORAL | Status: DC
  Administered 2022-05-14: 30 mg via ORAL
  Filled 2022-05-14: qty 3

## 2022-05-14 MED ORDER — CEPHALEXIN 500 MG PO CAPS: 500.0000 mg | ORAL_CAPSULE | Freq: Three times a day (TID) | ORAL | 0 refills | Status: AC

## 2022-05-14 NOTE — Discharge Summary (Addendum)
Physician Discharge Summary  Autumn Parrish GDJ:[id number] DOB: 1951-02-20 DOA: 05/10/2022  PCP: System, Provider Not In  Admit date: 05/10/2022 Discharge date: 05/14/2022  Admitted From: Home Disposition:  SNF  Discharge Condition:Stable CODE STATUS:FULL Diet recommendation: Heart Healthy  Brief/Interim Summary:  Patient is a 71 year old female with history of COPD, morbid obesity, hypertension, hypothyroidism, ADHD,DM on Adderall who was sent to the ED by her PCP for the evaluation of altered mental status, hypotension.  She was also bradycardic in the low 40s.  Infectious process was suspected.  CT chest/abdomen/pelvis did not show any acute findings.  Lab work showed creatinine of 3.12, baseline creatinine 1.2, leukocytosis.  Right lower extremity was noted to be erythematous, edematous and tender.  Patient was admitted for the management of acute metabolic encephalopathy secondary to sepsis from right lower extremity cellulitis, AKI.  She was hypotensive on presentation and was started on IV fluids.  Currently hemodynamically stable.  She is medically stable for discharge to skilled nursing facility as soon as bed is available.  Following problems were addressed during her hospitalization:  Acute metabolic encephalopathy/dementia:  CT head did not show any acute intracranial abnormalities. H/O TBI 20 years ago,has recently got confused as per sister since last few months. Falling frequently at home.She was [facility],now living at apartment,walks with walker/motorised scooter.  I think the confusion is ongoing problem and cannot be fixed. MRI of the brain showed no acute intracranial abnormality,generalized age-related cerebral atrophy with mild chronic small vessel ischemic disease, with a few scattered remote lacunar infarcts about the bilateral basal ganglia, chronic foci of encephalomalacia.Continue supportive care.   Sepsis secondary to right lower extremity  cellulitis:Presented with hypotension, leukocytosis.  Right lower extremity noted to be edematous, tender, erythematous.  Blood cultures remain negative. Antibiotics changed to oral.  Cellulitis is much better now   AKI on CKD stage IIIb: Baseline creatinine of 1.2-1.3.  Presented with creatinine in the range of 3.  Likely from dehydration, hypotension.  Kidney function currently normal after IV fluids given.  Fluids stopped.   History of diastolic CHF: Last echo on 06/2018 showed EF of 60 to 62%, grade 1 diastolic dysfunction.  Probably she was dehydrated on presentation.  She takes Lasix 40 mg daily at home.  Changed to 20 mg daily on discharge   Abnormal CT imaging: CT imaging showed bilateral low-attenuation adrenal masses which likely represent adrenal adenomas.  We recommend outpatient follow-up with adrenal washout CT or chemical shift MRI.   Hypertension: Takes metoprolol at home.  Dose decreased to 25 mg daily.     Hypothyroidism: Continue levothyroxine   Anxiety/depression: On Celexa at home.   Hyperlipidemia: On pravastatin   Morbid obesity: BMI 43   Debility/deconditioning: PT/OT recommending SNF on discharge.  TOC consulted      Discharge Diagnoses:  Principal Problem:   AMS (altered mental status)    Discharge Instructions  Discharge Instructions     Diet - low sodium heart healthy   Complete by: As directed    Discharge instructions   Complete by: As directed    1)Please take prescribed medications as instructed 2)Do an adrenal CT scan as an outpatient in 4-6 weeks   Increase activity slowly   Complete by: As directed       Allergies as of 05/14/2022       Reactions   Ketorolac Anaphylaxis, Swelling, Other (See Comments)   Throat swells   Prochlorperazine Anaphylaxis, Shortness Of Breath, Swelling, Other (  See Comments)   Throat swelling   Promethazine Anaphylaxis, Swelling, Other (See Comments)   Throat swells   Sulfa Antibiotics Anaphylaxis   Toradol  [ketorolac Tromethamine] Anaphylaxis   Buprenorphine Nausea Only   Nsaids Nausea Only, Other (See Comments)   NSAIDS upset the stomach- can tolerate ibuprofen         Medication List     STOP taking these medications    clobetasol ointment 0.05 % Commonly known as: TEMOVATE   ibuprofen 600 MG tablet Commonly known as: ADVIL   methylPREDNISolone 4 MG Tbpk tablet Commonly known as: MEDROL DOSEPAK   mupirocin ointment 2 % Commonly known as: BACTROBAN       TAKE these medications    acetaminophen 650 MG CR tablet Commonly known as: TYLENOL Take 1,300 mg by mouth every 8 (eight) hours as needed for pain.   amphetamine-dextroamphetamine 30 MG tablet Commonly known as: ADDERALL Take 1 tablet by mouth 2 (two) times daily.   Aspirin Low Dose 81 MG tablet Generic drug: aspirin EC Take 81 mg by mouth daily.   brimonidine 0.15 % ophthalmic solution Commonly known as: ALPHAGAN Place 1 drop into both eyes 2 (two) times daily.   cephALEXin 500 MG capsule Commonly known as: KEFLEX Take 1 capsule (500 mg total) by mouth every 8 (eight) hours for 3 days.   citalopram 20 MG tablet Commonly known as: CELEXA Take 20 mg by mouth daily.   famotidine 20 MG tablet Commonly known as: PEPCID Take 20 mg by mouth See admin instructions. Take 20 mg by mouth at bedtime   furosemide 20 MG tablet Commonly known as: LASIX Take 1 tablet (20 mg total) by mouth daily. What changed: Another medication with the same name was removed. Continue taking this medication, and follow the directions you see here.   latanoprost 0.005 % ophthalmic solution Commonly known as: XALATAN Place 1 drop into both eyes at bedtime.   levothyroxine 75 MCG tablet Commonly known as: SYNTHROID Take 75 mcg by mouth daily before breakfast.   metoprolol succinate 25 MG 24 hr tablet Commonly known as: TOPROL-XL Take 1 tablet (25 mg total) by mouth daily. Take with or immediately following a meal. What  changed:  medication strength how much to take   oxybutynin 15 MG 24 hr tablet Commonly known as: DITROPAN XL Take 15 mg by mouth daily.   polyethylene glycol 17 g packet Commonly known as: MIRALAX / GLYCOLAX Take 17 g by mouth daily as needed for mild constipation.   pravastatin 80 MG tablet Commonly known as: PRAVACHOL Take 80 mg by mouth daily.        Allergies  Allergen Reactions   Ketorolac Anaphylaxis, Swelling and Other (See Comments)    Throat swells   Prochlorperazine Anaphylaxis, Shortness Of Breath, Swelling and Other (See Comments)    Throat swelling   Promethazine Anaphylaxis, Swelling and Other (See Comments)    Throat swells   Sulfa Antibiotics Anaphylaxis   Toradol [Ketorolac Tromethamine] Anaphylaxis   Buprenorphine Nausea Only   Nsaids Nausea Only and Other (See Comments)    NSAIDS upset the stomach- can tolerate ibuprofen     Consultations: None   Procedures/Studies: CT Head Wo Contrast  Result Date: 05/10/2022 CLINICAL DATA:  Mental status change, unknown cause EXAM: CT HEAD WITHOUT CONTRAST TECHNIQUE: Contiguous axial images were obtained from the base of the skull through the vertex without intravenous contrast. RADIATION DOSE REDUCTION: This exam was performed according to the departmental dose-optimization program which includes  automated exposure control, adjustment of the mA and/or kV according to patient size and/or use of iterative reconstruction technique. COMPARISON:  None Available. BRAIN: BRAIN Patchy and confluent areas of decreased attenuation are noted throughout the deep and periventricular white matter of the cerebral hemispheres bilaterally, compatible with chronic microvascular ischemic disease. No evidence of large-territorial acute infarction. No parenchymal hemorrhage. No mass lesion. No extra-axial collection. No mass effect or midline shift. No hydrocephalus. Basilar cisterns are patent. Vascular: No hyperdense vessel. Skull:  Limited evaluation for skull base fracture due to motion artifact. Sinuses/Orbits: Paranasal sinuses and mastoid air cells are clear. The orbits are unremarkable. Other: None. IMPRESSION: No acute intracranial abnormality. Electronically Signed   By: Iven Finn M.D.   On: 05/10/2022 19:30   MR BRAIN WO CONTRAST  Result Date: 05/12/2022 CLINICAL DATA:  Initial evaluation for delirium. EXAM: MRI HEAD WITHOUT CONTRAST TECHNIQUE: Multiplanar, multiecho pulse sequences of the brain and surrounding structures were obtained without intravenous contrast. COMPARISON:  CT from 05/10/2022. FINDINGS: Brain: Diffuse prominence of the CSF containing spaces compatible with generalized cerebral atrophy. Scattered patchy T2/FLAIR hyperintensity involving the periventricular and deep white matter both cerebral hemispheres, most likely related chronic microvascular ischemic disease, mild in nature. Remote lacunar infarcts present about the bilateral basal ganglia. Symmetric foci of encephalomalacia involving the globus palladi could be related to remote ischemia or other insult. Similar changes involving the midbrain and cerebellum could also be related to prior ischemia or other insult. No evidence for acute or subacute ischemia. Gray-white matter differentiation otherwise maintained. No areas of chronic cortical infarction. No acute or chronic intracranial blood products. No mass lesion, mass effect or midline shift. No hydrocephalus or extra-axial fluid collection. Pituitary gland suprasellar region within normal limits. Vascular: Major intracranial vascular flow voids are maintained. Skull and upper cervical spine: Cranial junction within normal limits. Bone marrow signal intensity normal. No scalp soft tissue abnormality. Sinuses/Orbits: Globes orbital soft tissues within normal limits. Mild scattered mucosal thickening about the ethmoidal air cells. No significant mastoid effusion. Other: None. IMPRESSION: 1. No acute  intracranial abnormality. 2. Generalized age-related cerebral atrophy with mild chronic small vessel ischemic disease, with a few scattered remote lacunar infarcts about the bilateral basal ganglia. Additional chronic foci of encephalomalacia involving the globus palladi, midbrain, and cerebellum could be related to prior ischemia or other insult. Electronically Signed   By: Jeannine Boga M.D.   On: 05/12/2022 00:59   DG Chest Portable 1 View  Result Date: 05/10/2022 CLINICAL DATA:  Altered mental status, hypotension EXAM: PORTABLE CHEST 1 VIEW COMPARISON:  04/30/2022 FINDINGS: Transverse diameter of heart is increased. Central pulmonary vessels are more prominent. Increased interstitial markings are seen in the both lungs, more so on the left side. Apparent shift of mediastinum to the right may be due to rotation. Lateral costophrenic angles are indistinct. There is no pneumothorax. IMPRESSION: Cardiomegaly. There is prominence of central pulmonary vessels and increased interstitial and alveolar markings in both lungs suggesting pulmonary edema with CHF. Possibility of underlying pneumonia is not excluded. Possible small pleural effusions, more so on the left side. Electronically Signed   By: Elmer Picker M.D.   On: 05/10/2022 18:25   CT CHEST ABDOMEN PELVIS WO CONTRAST  Result Date: 05/10/2022 CLINICAL DATA:  Bradycardia and hypotension. EXAM: CT CHEST, ABDOMEN AND PELVIS WITHOUT CONTRAST TECHNIQUE: Multidetector CT imaging of the chest, abdomen and pelvis was performed following the standard protocol without IV contrast. RADIATION DOSE REDUCTION: This exam was performed according to the  departmental dose-optimization program which includes automated exposure control, adjustment of the mA and/or kV according to patient size and/or use of iterative reconstruction technique. COMPARISON:  Apr 30, 2022 and September 03, 2021 FINDINGS: CT CHEST FINDINGS Cardiovascular: There is mild calcification of  the aortic arch, without evidence of aortic aneurysm. A 9 mm thick crescentic shaped area of fluid attenuation (approximately 11.65 Hounsfield units) is seen within the right and anterior aspect of the superior aortic recess. This is mildly increased in size on the current exam and likely represents pericardial fluid. Normal heart size. Mediastinum/Nodes: No enlarged mediastinal, hilar, or axillary lymph nodes. Thyroid gland, trachea, and esophagus demonstrate no significant findings. Lungs/Pleura: Evaluation of the lung parenchyma is limited secondary to patient motion. Mild diffuse thickening of the interstitium is noted with mild atelectatic changes seen within the posterior aspects of the bilateral upper lobes and bilateral lung bases. Musculoskeletal: Multilevel degenerative changes seen throughout the thoracic spine. CT ABDOMEN PELVIS FINDINGS Hepatobiliary: No focal liver abnormality is seen. Status post cholecystectomy. No biliary dilatation. Pancreas: Unremarkable. No pancreatic ductal dilatation or surrounding inflammatory changes. Spleen: Normal in size without focal abnormality. Adrenals/Urinary Tract: A 2.8 cm x 2.0 cm homogeneous low-attenuation left adrenal mass is seen (approximately 17.50 Hounsfield units). A 2.3 cm x 1.6 cm heterogeneous low-attenuation right adrenal mass is also noted (approximately 3.93 Hounsfield units). Kidneys are normal, without renal calculi, focal lesion, or hydronephrosis. Bladder is unremarkable. Stomach/Bowel: Stomach is within normal limits. Appendix appears normal. No evidence of bowel wall thickening, distention, or inflammatory changes. Noninflamed diverticula are seen throughout the large bowel. Vascular/Lymphatic: Aortic atherosclerosis. No enlarged abdominal or pelvic lymph nodes. Reproductive: Status post hysterectomy. No adnexal masses. Other: No abdominal wall hernia or abnormality. No abdominopelvic ascites. Musculoskeletal: Multilevel degenerative changes are  seen throughout the lumbar spine. IMPRESSION: 1. Mild diffuse thickening of the interstitium with mild bilateral upper lobe and bibasilar atelectasis. 2. Bilateral low-attenuation adrenal masses which likely represent adrenal adenomas. Recommend adrenal washout CT or chemical shift MRI. JACR 2017 Aug; 14(8):1038-44, JCAT 2016 Mar-Apr; 40(2):194-200, Urol J 2006 Spring; 3(2):71-4. 3. Colonic diverticulosis. 4. Aortic atherosclerosis. Aortic Atherosclerosis (ICD10-I70.0). Electronically Signed   By: Virgina Norfolk M.D.   On: 05/10/2022 20:06      Subjective: Patient seen and examined at the bedside this morning.  Hemodynamically stable, lying in bed.  Comfortable.  Obeys commands, communicates well but not oriented to time.  She is medically stable for discharge whenever possible.  I called her sister  Vickii Chafe and updated her about current situation.   Discharge Exam: Vitals:   05/14/22 0519 05/14/22 0931  BP: (!) 133/51 116/80  Pulse: 66 100  Resp: 18 18  Temp: 98.6 F (37 C) 99.2 F (37.3 C)  SpO2: 94% 95%   Vitals:   05/13/22 2106 05/13/22 2130 05/14/22 0519 05/14/22 0931  BP: (!) 165/105 (!) 153/80 (!) 133/51 116/80  Pulse: 63 65 66 100  Resp: '20  18 18  '$ Temp: (!) 100.4 F (38 C)  98.6 F (37 C) 99.2 F (37.3 C)  TempSrc: Oral  Oral Oral  SpO2: 96%  94% 95%  Weight:      Height:        General: Pt is alert, awake, not in acute distress Cardiovascular: RRR, S1/S2 +, no rubs, no gallops Respiratory: CTA bilaterally, no wheezing, no rhonchi Abdominal: Soft, NT, ND, bowel sounds + Extremities: no edema, no cyanosis    The results of significant diagnostics from this hospitalization (including imaging,  microbiology, ancillary and laboratory) are listed below for reference.     Microbiology: Recent Results (from the past 240 hour(s))  Blood Culture (routine x 2)     Status: None (Preliminary result)   Collection Time: 05/10/22  6:31 PM   Specimen: BLOOD  Result Value  Ref Range Status   Specimen Description BLOOD BLOOD LEFT FOREARM  Final   Special Requests   Final    BOTTLES DRAWN AEROBIC AND ANAEROBIC Blood Culture results may not be optimal due to an inadequate volume of blood received in culture bottles   Culture   Final    NO GROWTH 4 DAYS Performed at [facility], [place] [address] [place], [place] [address]    Report Status PENDING  Incomplete  Urine Culture     Status: None   Collection Time: 05/10/22  6:31 PM   Specimen: In/Out Cath Urine  Result Value Ref Range Status   Specimen Description IN/OUT CATH URINE  Final   Special Requests NONE  Final   Culture   Final    NO GROWTH Performed at [facility], [place] [address], [place], [place] [address]    Report Status 05/12/2022 FINAL  Final  Blood Culture (routine x 2)     Status: None (Preliminary result)   Collection Time: 05/10/22 10:25 PM   Specimen: BLOOD RIGHT HAND  Result Value Ref Range Status   Specimen Description BLOOD RIGHT HAND  Final   Special Requests   Final    BOTTLES DRAWN AEROBIC ONLY Blood Culture adequate volume   Culture   Final    NO GROWTH 4 DAYS Performed at [facility], [place] [address], [place], [place] [address]    Report Status PENDING  Incomplete     Labs: BNP (last 3 results) Recent Labs    05/10/22 1733  BNP 834.1*   Basic Metabolic Panel: Recent Labs  Lab 05/10/22 1733 05/10/22 1802 05/10/22 2225 05/11/22 0220 05/12/22 0329  NA 140 140  --  142 139  K 3.6 3.5  --  3.9 4.0  CL 110  --   --  112* 112*  CO2 20*  --   --  23 24  GLUCOSE 124*  --   --  99 106*  BUN 42*  --   --  38* 22  CREATININE 3.12*  --  2.54* 2.14* 0.90  CALCIUM 8.0*  --   --  8.0* 8.4*  MG 2.0  --   --  1.9  --   PHOS  --   --   --  5.2*  --    Liver Function Tests: Recent Labs  Lab 05/10/22 1733 05/11/22 0220  AST 36 35  ALT 20 22  ALKPHOS 75 63  BILITOT 0.6 0.4  PROT 6.0* 5.2*  ALBUMIN 3.2* 2.8*   No results for input(s): LIPASE, AMYLASE  in the last 168 hours. Recent Labs  Lab 05/10/22 1733  AMMONIA 23   CBC: Recent Labs  Lab 05/10/22 1733 05/10/22 1802 05/10/22 2225 05/11/22 0220 05/12/22 0329  WBC 15.8*  --  10.7* 10.3 6.4  NEUTROABS 9.0*  --   --  6.3  --   HGB 11.3* 11.6* 10.8* 10.7* 10.7*  HCT 36.8 34.0* 35.1* 34.0* 34.0*  MCV 95.1  --  93.1 93.4 92.6  PLT 192  --  158 140* 157   Cardiac Enzymes: No results for input(s): CKTOTAL, CKMB, CKMBINDEX, TROPONINI in the last 168 hours. BNP:  Invalid input(s): POCBNP CBG: Recent Labs  Lab 05/10/22 1715 05/10/22 2210  GLUCAP 147* 103*   D-Dimer No results for input(s): DDIMER in the last 72 hours. Hgb A1c No results for input(s): HGBA1C in the last 72 hours. Lipid Profile No results for input(s): CHOL, HDL, LDLCALC, TRIG, CHOLHDL, LDLDIRECT in the last 72 hours. Thyroid function studies No results for input(s): TSH, T4TOTAL, T3FREE, THYROIDAB in the last 72 hours.  Invalid input(s): FREET3 Anemia work up No results for input(s): VITAMINB12, FOLATE, FERRITIN, TIBC, IRON, RETICCTPCT in the last 72 hours. Urinalysis    Component Value Date/Time   COLORURINE YELLOW 05/10/2022 1945   APPEARANCEUR HAZY (A) 05/10/2022 1945   LABSPEC 1.017 05/10/2022 1945   PHURINE 5.0 05/10/2022 1945   GLUCOSEU NEGATIVE 05/10/2022 1945   HGBUR NEGATIVE 05/10/2022 1945   BILIRUBINUR NEGATIVE 05/10/2022 1945   KETONESUR NEGATIVE 05/10/2022 1945   PROTEINUR NEGATIVE 05/10/2022 1945   NITRITE NEGATIVE 05/10/2022 1945   LEUKOCYTESUR NEGATIVE 05/10/2022 1945   Sepsis Labs Invalid input(s): PROCALCITONIN,  WBC,  LACTICIDVEN Microbiology Recent Results (from the past 240 hour(s))  Blood Culture (routine x 2)     Status: None (Preliminary result)   Collection Time: 05/10/22  6:31 PM   Specimen: BLOOD  Result Value Ref Range Status   Specimen Description BLOOD BLOOD LEFT FOREARM  Final   Special Requests   Final    BOTTLES DRAWN AEROBIC AND ANAEROBIC Blood Culture  results may not be optimal due to an inadequate volume of blood received in culture bottles   Culture   Final    NO GROWTH 4 DAYS Performed at [facility], [place] [address], [place], [place] [address]    Report Status PENDING  Incomplete  Urine Culture     Status: None   Collection Time: 05/10/22  6:31 PM   Specimen: In/Out Cath Urine  Result Value Ref Range Status   Specimen Description IN/OUT CATH URINE  Final   Special Requests NONE  Final   Culture   Final    NO GROWTH Performed at [facility], [place] [address] [place], [place] [address]    Report Status 05/12/2022 FINAL  Final  Blood Culture (routine x 2)     Status: None (Preliminary result)   Collection Time: 05/10/22 10:25 PM   Specimen: BLOOD RIGHT HAND  Result Value Ref Range Status   Specimen Description BLOOD RIGHT HAND  Final   Special Requests   Final    BOTTLES DRAWN AEROBIC ONLY Blood Culture adequate volume   Culture   Final    NO GROWTH 4 DAYS Performed at [facility], [place] [address] [place], [place] [address]    Report Status PENDING  Incomplete    Please note: You were cared for by a hospitalist during your hospital stay. Once you are discharged, your primary care physician will handle any further medical issues. Please note that NO REFILLS for any discharge medications will be authorized once you are discharged, as it is imperative that you return to your primary care physician (or establish a relationship with a primary care physician if you do not have one) for your post hospital discharge needs so that they can reassess your need for medications and monitor your lab values.    Time coordinating discharge: 40 minutes  SIGNED:   Shelly Coss, MD  [facility] 05/14/2022, 11:23 AM Pager [phone]  If 7PM-7AM, please contact night-coverage [email or web address] Password [email or web address]

## 2022-05-14 NOTE — TOC Progression Note (Signed)
Transition of Care [facility]) - Initial/Assessment Note    Patient Details  Name: Autumn Parrish MRN: [id number] Date of Birth: 05-01-1951  [facility]) CM/SW Contact:    Milinda Antis, [place] Phone Number: 05/14/2022, 10:09 AM  Clinical Narrative:                 CSW contacted admissions at [facility] to inquire about the facility's ability to accept the patient today and is awaiting a response.    Expected Discharge Plan: Skilled Nursing Facility Barriers to Discharge: SNF Pending bed offer, Insurance Authorization, Continued Medical Work up   Patient Goals and CMS Choice        Expected Discharge Plan and Services Expected Discharge Plan: [place]       Living arrangements for the past 2 months: [place] (recently in SNF for [facility])                                      Prior Living Arrangements/Services Living arrangements for the past 2 months: [place], [place] (recently in SNF for [facility]) Lives with:: Self Patient language and need for interpreter reviewed:: No        Need for Family Participation in Patient Care: Yes (Comment) Care giver support system in place?: No (comment) Current home services: DME Criminal Activity/Legal Involvement Pertinent to Current Situation/Hospitalization: No - Comment as needed  Activities of Daily Living      Permission Sought/Granted Permission sought to share information with : Chartered certified accountant granted to share information with : Yes, Verbal Permission Granted              Emotional Assessment       Orientation: : Oriented to Self, Oriented to Place, Oriented to  Time, Oriented to Situation (poor judgement) Alcohol / Substance Use: Not Applicable Psych Involvement: No (comment)  Admission diagnosis:  AKI (acute kidney injury) ([place]) [N17.9] Hypotension, unspecified hypotension type [I95.9] Altered mental status,  unspecified altered mental status type [R41.82] AMS (altered mental status) [R41.82] Sepsis with acute renal failure, due to unspecified organism, unspecified acute renal failure type, unspecified whether septic shock present ([place]) [A41.9, R65.20, N17.9] Patient Active Problem List   Diagnosis Date Noted   AMS (altered mental status) 05/10/2022   Unilateral primary osteoarthritis, right knee 01/17/2021   Morbid (severe) obesity due to excess calories ([place]) 01/17/2021   Contusion of bone 03/29/2020   Callus 03/29/2020   Pain due to onychomycosis of toenails of both feet 12/28/2019   Laceration of toe 12/28/2019   Typical atrial flutter ([place]) 04/08/2018   PCP:  System, Provider Not In Pharmacy:  No Pharmacies Listed    Social Determinants of Health (SDOH) Interventions    Readmission Risk Interventions     View : No data to display.

## 2022-05-14 NOTE — Progress Notes (Signed)
Nursing report called to DON at [facility]

## 2022-05-14 NOTE — TOC Transition Note (Signed)
Transition of Care [facility]) - CM/SW Discharge Note   Patient Details  Name: CHARLOTTA LAPAGLIA MRN: [id number] Date of Birth: March 19, 1951  Transition of Care [facility]) CM/SW Contact:  Milinda Antis, [place] Phone Number: 05/14/2022, 1:00 PM   Clinical Narrative:    Patient will DC to:  [facility] Anticipated DC date: 05/14/2022 Transport by: [facility]   Per MD patient ready for DC to SNF. RN to call report prior to discharge [phone] room 306B. RN, patient, patient's family, and facility notified of DC. Discharge Summary and FL2 sent to facility. DC packet on chart.  [facility] will transport the patient.   CSW will sign off for now as social work intervention is no longer needed. Please consult [place] again if new needs arise.     Final next level of care: Skilled Nursing Facility Barriers to Discharge: Barriers Resolved   Patient Goals and CMS Choice        Discharge Placement              Patient chooses bed at:  [facility]) Patient to be transferred to facility by: [facility] transportation Name of family member notified: Cherylann Ratel (Sister)   [phone] Patient and family notified of of transfer: 05/14/22  Discharge Plan and Services                                     Social Determinants of Health (SDOH) Interventions     Readmission Risk Interventions     View : No data to display.

## 2022-05-14 NOTE — Progress Notes (Addendum)
DISCHARGE NOTE SNF [facility] to be discharged [place] per MD order. Patient verbalized understanding.  Skin clean, dry and intact without evidence of skin break down, no evidence of skin tears noted. IV catheter discontinued intact. Site without signs and symptoms of complications. Dressing and pressure applied. Pt denies pain at the site currently. No complaints noted.  Patient free of lines, drains, and wounds.   Discharge packet assembled. An After Visit Summary (AVS) was printed and given to the EMS personnel. Patient escorted via stretcher and discharged to [facility] via [facility]. Report called to accepting facility; all questions and concerns addressed.   Jemar Paulsen S Jone Panebianco, RN _______________________________________________________________________

## 2022-05-15 LAB — CULTURE, BLOOD (ROUTINE X 2)
Culture: NO GROWTH
Culture: NO GROWTH
Special Requests: ADEQUATE

## 2022-05-24 ENCOUNTER — Other Ambulatory Visit (HOSPITAL_COMMUNITY): Payer: Self-pay | Admitting: Internal Medicine

## 2022-05-24 ENCOUNTER — Other Ambulatory Visit: Payer: Self-pay | Admitting: Internal Medicine

## 2022-05-24 DIAGNOSIS — Z0001 Encounter for general adult medical examination with abnormal findings: Secondary | ICD-10-CM

## 2022-06-28 ENCOUNTER — Other Ambulatory Visit (HOSPITAL_COMMUNITY): Payer: Self-pay | Admitting: Internal Medicine

## 2022-06-28 ENCOUNTER — Ambulatory Visit (HOSPITAL_COMMUNITY)
Admission: RE | Admit: 2022-06-28 | Discharge: 2022-06-28 | Disposition: A | Discharge status: Home / Self Care | Payer: Medicare (Managed Care) | Source: Ambulatory Visit | Attending: Internal Medicine | Admitting: Internal Medicine

## 2022-06-28 ENCOUNTER — Encounter (HOSPITAL_COMMUNITY): Payer: Self-pay

## 2022-06-28 DIAGNOSIS — I700 Atherosclerosis of aorta: Secondary | ICD-10-CM | POA: Insufficient documentation

## 2022-06-28 DIAGNOSIS — Z0001 Encounter for general adult medical examination with abnormal findings: Secondary | ICD-10-CM

## 2022-06-28 DIAGNOSIS — D3502 Benign neoplasm of left adrenal gland: Secondary | ICD-10-CM | POA: Insufficient documentation

## 2022-06-28 DIAGNOSIS — D3501 Benign neoplasm of right adrenal gland: Secondary | ICD-10-CM | POA: Insufficient documentation

## 2022-06-28 MED ORDER — IOHEXOL 300 MG/ML  SOLN: 100.0000 mL | Freq: Once | INTRAMUSCULAR | Status: DC | PRN

## 2022-07-02 ENCOUNTER — Ambulatory Visit: Payer: Medicare (Managed Care) | Admitting: Podiatry

## 2022-07-03 LAB — POCT I-STAT CREATININE: Creatinine, Ser: 0.6 mg/dL (ref 0.44–1.00)

## 2022-07-22 ENCOUNTER — Ambulatory Visit: Payer: Medicare (Managed Care) | Admitting: Podiatry

## 2022-08-19 ENCOUNTER — Ambulatory Visit: Payer: Medicare (Managed Care) | Admitting: Podiatry

## 2022-09-02 ENCOUNTER — Ambulatory Visit: Payer: Medicare (Managed Care)

## 2022-09-06 ENCOUNTER — Ambulatory Visit
Admission: RE | Admit: 2022-09-06 | Discharge: 2022-09-06 | Disposition: A | Discharge status: Home / Self Care | Payer: Medicare (Managed Care) | Source: Ambulatory Visit | Attending: Family Medicine | Admitting: Family Medicine

## 2022-09-06 DIAGNOSIS — J449 Chronic obstructive pulmonary disease, unspecified: Secondary | ICD-10-CM

## 2022-09-13 ENCOUNTER — Other Ambulatory Visit (HOSPITAL_COMMUNITY): Payer: Self-pay | Admitting: Internal Medicine

## 2022-09-13 DIAGNOSIS — Z0001 Encounter for general adult medical examination with abnormal findings: Secondary | ICD-10-CM

## 2022-11-25 ENCOUNTER — Ambulatory Visit (INDEPENDENT_AMBULATORY_CARE_PROVIDER_SITE_OTHER): Payer: Medicare (Managed Care)

## 2022-11-25 ENCOUNTER — Ambulatory Visit (INDEPENDENT_AMBULATORY_CARE_PROVIDER_SITE_OTHER): Payer: Medicare (Managed Care) | Admitting: Podiatry

## 2022-11-25 DIAGNOSIS — L97512 Non-pressure chronic ulcer of other part of right foot with fat layer exposed: Secondary | ICD-10-CM

## 2022-11-25 MED ORDER — DOXYCYCLINE HYCLATE 100 MG PO TABS: 100.0000 mg | ORAL_TABLET | Freq: Two times a day (BID) | ORAL | 0 refills | Status: DC

## 2022-11-25 MED ORDER — GENTAMICIN SULFATE 0.1 % EX OINT: 1.0000 | TOPICAL_OINTMENT | Freq: Three times a day (TID) | CUTANEOUS | 0 refills | Status: DC

## 2022-11-25 MED ORDER — GENTAMICIN SULFATE 0.1 % EX OINT: 1.0000 | TOPICAL_OINTMENT | Freq: Three times a day (TID) | CUTANEOUS | 0 refills | Status: AC

## 2022-11-25 NOTE — Progress Notes (Signed)
  Subjective:  Patient ID: Autumn Parrish, female    DOB: 09-Feb-1951,  MRN: [id number]  Chief Complaint  Patient presents with   Nail Problem   Callouses   Foot Ulcer    71 y.o. female presents with the above complaint. History confirmed with patient.  She returns for follow-up the calluses of gotten very thick and there has been some drainage in the tip of the second toe that is new   Objective:  Physical Exam: warm, good capillary refill, DP reduced bilateral, no trophic changes or ulcerative lesions, and PT reduced bilateral.  Bilateral hallux there is severe hyperkeratosis and callus formation there is an ulceration on the right hallux centrally in the portion of the callus, a fissure under the first metatarsal phalangeal joint that leads to ulceration as well as ulceration on the tip of the second toe with exposed subcutaneous tissue, there is some erythema and drainage   Radiographs: Multiple views x-ray of the right foot: No definitive evidence of osteomyelitis noted on new x-rays taken today  ABIs at vascular office improved, no need for intervention  Assessment:   1. Ulcer of great toe, right, with fat layer exposed ([place])      Plan:  Patient was evaluated and treated and all questions answered.  Patient educated on diabetes. Discussed proper diabetic foot care and discussed risks and complications of disease. Educated patient in depth on reasons to return to the office immediately should he/she discover anything concerning or new on the feet. All questions answered. Discussed proper shoes as well.   Ulcer right hallux -We discussed the etiology and factors that are a part of the wound healing process.  We also discussed the risk of infection both soft tissue and osteomyelitis from open ulceration.  Discussed the risk of limb loss if this happens or worsens. -Debridement as below. -Dressed with Silvadene, DSD. -Rx for gentamicin ointment given to her she will use this  daily at home -Rx for doxycycline twice daily -May need to consider biopsy in the future.   Return in about 3 weeks (around 12/16/2022) for wound care.

## 2022-12-17 ENCOUNTER — Ambulatory Visit: Payer: Medicare (Managed Care) | Admitting: Podiatry

## 2022-12-26 ENCOUNTER — Ambulatory Visit (INDEPENDENT_AMBULATORY_CARE_PROVIDER_SITE_OTHER): Payer: Medicare (Managed Care)

## 2022-12-26 ENCOUNTER — Telehealth: Payer: Self-pay | Admitting: Podiatry

## 2022-12-26 ENCOUNTER — Ambulatory Visit (INDEPENDENT_AMBULATORY_CARE_PROVIDER_SITE_OTHER): Payer: Medicare (Managed Care) | Admitting: Podiatry

## 2022-12-26 DIAGNOSIS — L03031 Cellulitis of right toe: Secondary | ICD-10-CM

## 2022-12-26 DIAGNOSIS — L02611 Cutaneous abscess of right foot: Secondary | ICD-10-CM

## 2022-12-26 MED ORDER — DOXYCYCLINE HYCLATE 100 MG PO TABS
100.0000 mg | ORAL_TABLET | Freq: Two times a day (BID) | ORAL | 0 refills | Status: DC
Start: 2022-12-26 — End: 2022-12-30

## 2022-12-26 NOTE — Progress Notes (Signed)
  Subjective:  Patient ID: Autumn Parrish, female    DOB: 08/02/1951,  MRN: [id number]  Chief Complaint  Patient presents with   Foot Ulcer    Right 2nd toe - very red and swollen, very painful    72 y.o. female presents with the above complaint. History confirmed with patient.  She returns for follow-up the last couple days the toe has gotten very red swollen and very painful for her now  Objective:  Physical Exam: warm, good capillary refill, DP reduced bilateral, no trophic changes or ulcerative lesions, and PT reduced bilateral.  Bilateral hallux there is severe hyperkeratosis and callus formation there is an ulceration on the right hallux centrally in the portion of the callus, a fissure under the first metatarsal phalangeal joint that leads to ulceration as well as ulceration on the tip of the second toe with exposed subcutaneous tissue, second toe has worsened severely edematous with purulent drainage from the tip   Radiographs: Multiple views x-ray of the right foot: New radiographs taken today show osteolysis and destruction of cortical bone of distal phalanx second toe right foot  ABIs at vascular office improved, no need for intervention  Assessment:   1. Cellulitis and abscess of toe of right foot      Plan:  Patient was evaluated and treated and all questions answered.  Unfortunately has worsened severely.  It took a culture of the wound drainage.  I recommended she proceed to the emergency room for admission for osteomyelitis of the right second toe for IV broad-spectrum antibiotics and operative intervention likely will need MRI and amputation of the second toe which I discussed with her.  We discussed the rationale for this to prevent spread of further infection.  She has cellulitis to the midfoot today.  She said she would likely go to [place] discussed with her that I would not be able to treat her there as I do not have privileges here but will be happy to  treat her at [facility] or [facility] or [facility].  If she prefers to go to that facility that is fine and a Garment/textile technologist may handle this as well or they may transfer her here we are happy to assist in any way possible.  I did discuss with her that I do not think she should delay this any further than today and should go ASAP.  I did give her a prescription for doxycycline in the event she does not go to the emergency room today.   No follow-ups on file.

## 2022-12-26 NOTE — Addendum Note (Signed)
Addended bySherryle Lis, Rawlin Reaume R on: 12/26/2022 12:43 PM   Modules accepted: Level of Service

## 2022-12-26 NOTE — Telephone Encounter (Signed)
Pt states that she wants to stay local in [place] to get her IV treatment. Pt wants referral with a provider in [place].  Please advise

## 2022-12-27 ENCOUNTER — Encounter (HOSPITAL_COMMUNITY): Payer: Self-pay

## 2022-12-27 ENCOUNTER — Other Ambulatory Visit: Payer: Self-pay

## 2022-12-27 ENCOUNTER — Emergency Department (HOSPITAL_COMMUNITY): Payer: Medicare (Managed Care)

## 2022-12-27 ENCOUNTER — Inpatient Hospital Stay (HOSPITAL_COMMUNITY)
Admission: EM | Admit: 2022-12-27 | Discharge: 2022-12-30 | DRG: 475 | Disposition: A | Discharge status: Home Health | Payer: Medicare (Managed Care) | Attending: Internal Medicine | Admitting: Internal Medicine

## 2022-12-27 ENCOUNTER — Telehealth: Payer: Self-pay | Admitting: Podiatry

## 2022-12-27 DIAGNOSIS — R001 Bradycardia, unspecified: Secondary | ICD-10-CM | POA: Diagnosis present

## 2022-12-27 DIAGNOSIS — I10 Essential (primary) hypertension: Secondary | ICD-10-CM | POA: Diagnosis not present

## 2022-12-27 DIAGNOSIS — H409 Unspecified glaucoma: Secondary | ICD-10-CM | POA: Diagnosis present

## 2022-12-27 DIAGNOSIS — E039 Hypothyroidism, unspecified: Secondary | ICD-10-CM | POA: Insufficient documentation

## 2022-12-27 DIAGNOSIS — Z882 Allergy status to sulfonamides status: Secondary | ICD-10-CM

## 2022-12-27 DIAGNOSIS — F32A Depression, unspecified: Secondary | ICD-10-CM | POA: Diagnosis present

## 2022-12-27 DIAGNOSIS — F909 Attention-deficit hyperactivity disorder, unspecified type: Secondary | ICD-10-CM | POA: Diagnosis present

## 2022-12-27 DIAGNOSIS — Z888 Allergy status to other drugs, medicaments and biological substances status: Secondary | ICD-10-CM

## 2022-12-27 DIAGNOSIS — Z83438 Family history of other disorder of lipoprotein metabolism and other lipidemia: Secondary | ICD-10-CM

## 2022-12-27 DIAGNOSIS — M868X7 Other osteomyelitis, ankle and foot: Secondary | ICD-10-CM | POA: Diagnosis not present

## 2022-12-27 DIAGNOSIS — J449 Chronic obstructive pulmonary disease, unspecified: Secondary | ICD-10-CM | POA: Diagnosis present

## 2022-12-27 DIAGNOSIS — Z6841 Body Mass Index (BMI) 40.0 and over, adult: Secondary | ICD-10-CM

## 2022-12-27 DIAGNOSIS — Z8249 Family history of ischemic heart disease and other diseases of the circulatory system: Secondary | ICD-10-CM

## 2022-12-27 DIAGNOSIS — Z833 Family history of diabetes mellitus: Secondary | ICD-10-CM

## 2022-12-27 DIAGNOSIS — Z886 Allergy status to analgesic agent status: Secondary | ICD-10-CM

## 2022-12-27 DIAGNOSIS — L97519 Non-pressure chronic ulcer of other part of right foot with unspecified severity: Secondary | ICD-10-CM | POA: Diagnosis present

## 2022-12-27 DIAGNOSIS — L03116 Cellulitis of left lower limb: Secondary | ICD-10-CM | POA: Diagnosis present

## 2022-12-27 DIAGNOSIS — J42 Unspecified chronic bronchitis: Secondary | ICD-10-CM

## 2022-12-27 DIAGNOSIS — G629 Polyneuropathy, unspecified: Secondary | ICD-10-CM | POA: Diagnosis present

## 2022-12-27 DIAGNOSIS — E876 Hypokalemia: Secondary | ICD-10-CM | POA: Diagnosis present

## 2022-12-27 DIAGNOSIS — E872 Acidosis, unspecified: Secondary | ICD-10-CM | POA: Diagnosis present

## 2022-12-27 DIAGNOSIS — Z7982 Long term (current) use of aspirin: Secondary | ICD-10-CM

## 2022-12-27 DIAGNOSIS — E860 Dehydration: Secondary | ICD-10-CM | POA: Diagnosis present

## 2022-12-27 DIAGNOSIS — E871 Hypo-osmolality and hyponatremia: Secondary | ICD-10-CM | POA: Diagnosis present

## 2022-12-27 DIAGNOSIS — E785 Hyperlipidemia, unspecified: Secondary | ICD-10-CM | POA: Diagnosis present

## 2022-12-27 DIAGNOSIS — Z79890 Hormone replacement therapy (postmenopausal): Secondary | ICD-10-CM

## 2022-12-27 DIAGNOSIS — I483 Typical atrial flutter: Secondary | ICD-10-CM | POA: Diagnosis present

## 2022-12-27 DIAGNOSIS — Z809 Family history of malignant neoplasm, unspecified: Secondary | ICD-10-CM

## 2022-12-27 DIAGNOSIS — Z79899 Other long term (current) drug therapy: Secondary | ICD-10-CM

## 2022-12-27 DIAGNOSIS — M869 Osteomyelitis, unspecified: Principal | ICD-10-CM | POA: Diagnosis present

## 2022-12-27 DIAGNOSIS — W19XXXA Unspecified fall, initial encounter: Secondary | ICD-10-CM

## 2022-12-27 DIAGNOSIS — Z87891 Personal history of nicotine dependence: Secondary | ICD-10-CM

## 2022-12-27 DIAGNOSIS — Z823 Family history of stroke: Secondary | ICD-10-CM

## 2022-12-27 DIAGNOSIS — F419 Anxiety disorder, unspecified: Secondary | ICD-10-CM | POA: Diagnosis present

## 2022-12-27 LAB — CBC WITH DIFFERENTIAL/PLATELET
Abs Immature Granulocytes: 0.04 10*3/uL (ref 0.00–0.07)
Basophils Absolute: 0.1 10*3/uL (ref 0.0–0.1)
Basophils Relative: 1 %
Eosinophils Absolute: 0.1 10*3/uL (ref 0.0–0.5)
Eosinophils Relative: 1 %
HCT: 36.2 % (ref 36.0–46.0)
Hemoglobin: 11.6 g/dL — ABNORMAL LOW (ref 12.0–15.0)
Immature Granulocytes: 0 %
Lymphocytes Relative: 18 %
Lymphs Abs: 2 10*3/uL (ref 0.7–4.0)
MCH: 27.8 pg (ref 26.0–34.0)
MCHC: 32 g/dL (ref 30.0–36.0)
MCV: 86.8 fL (ref 80.0–100.0)
Monocytes Absolute: 1.3 10*3/uL — ABNORMAL HIGH (ref 0.1–1.0)
Monocytes Relative: 12 %
Neutro Abs: 7.4 10*3/uL (ref 1.7–7.7)
Neutrophils Relative %: 68 %
Platelets: 281 10*3/uL (ref 150–400)
RBC: 4.17 MIL/uL (ref 3.87–5.11)
RDW: 13.7 % (ref 11.5–15.5)
WBC: 11 10*3/uL — ABNORMAL HIGH (ref 4.0–10.5)
nRBC: 0 % (ref 0.0–0.2)

## 2022-12-27 LAB — COMPREHENSIVE METABOLIC PANEL
ALT: 10 U/L (ref 0–44)
AST: 14 U/L — ABNORMAL LOW (ref 15–41)
Albumin: 3.3 g/dL — ABNORMAL LOW (ref 3.5–5.0)
Alkaline Phosphatase: 66 U/L (ref 38–126)
Anion gap: 9 (ref 5–15)
BUN: 18 mg/dL (ref 8–23)
CO2: 21 mmol/L — ABNORMAL LOW (ref 22–32)
Calcium: 8.8 mg/dL — ABNORMAL LOW (ref 8.9–10.3)
Chloride: 102 mmol/L (ref 98–111)
Creatinine, Ser: 0.97 mg/dL (ref 0.44–1.00)
GFR, Estimated: >60 mL/min (ref >60)
Glucose, Bld: 108 mg/dL — ABNORMAL HIGH (ref 70–99)
Potassium: 3.5 mmol/L (ref 3.5–5.1)
Sodium: 132 mmol/L — ABNORMAL LOW (ref 135–145)
Total Bilirubin: 0.4 mg/dL (ref 0.3–1.2)
Total Protein: 7 g/dL (ref 6.5–8.1)

## 2022-12-27 MED ORDER — VANCOMYCIN HCL 2000 MG/400ML IV SOLN
2000.0000 mg | Freq: Once | INTRAVENOUS | Status: DC
  Filled 2022-12-27 (×2): qty 400

## 2022-12-27 MED ORDER — VANCOMYCIN HCL 750 MG/150ML IV SOLN
750.0000 mg | Freq: Two times a day (BID) | INTRAVENOUS | Status: DC
  Filled 2022-12-27: qty 150

## 2022-12-27 MED ORDER — FENTANYL CITRATE PF 50 MCG/ML IJ SOSY
50.0000 ug | PREFILLED_SYRINGE | Freq: Once | INTRAMUSCULAR | Status: AC
  Administered 2022-12-28: 50 ug via INTRAVENOUS
  Filled 2022-12-27 (×2): qty 1

## 2022-12-27 MED ORDER — PIPERACILLIN-TAZOBACTAM 3.375 G IVPB 30 MIN
3.3750 g | Freq: Once | INTRAVENOUS | Status: AC
  Administered 2022-12-28: 3.375 g via INTRAVENOUS
  Filled 2022-12-27: qty 50

## 2022-12-27 MED ORDER — PIPERACILLIN-TAZOBACTAM 3.375 G IVPB
3.3750 g | Freq: Three times a day (TID) | INTRAVENOUS | Status: DC
  Administered 2022-12-28 – 2022-12-30 (×7): 3.375 g via INTRAVENOUS
  Filled 2022-12-27 (×7): qty 50

## 2022-12-27 NOTE — Progress Notes (Signed)
Pharmacy Antibiotic Note  Autumn Parrish is a 72 y.o. female admitted on 12/27/2022 presenting with a right foot infection.  Pharmacy has been consulted for zosyn and vancomycin dosing.  Plan: Vancomycin 2g IV x 1, then 750 mg IV q 12h (eAUC 499) Zosyn 3.375g IV every 8 hours (extended 4h infusion) Monitor renal function, Cx/intervention plans and clinical progression to narrow Vancomycin levels as indicated   Height: 5' 6.5" (168.9 cm) Weight: 127 kg (280 lb) IBW/kg (Calculated) : 60.45  Temp (24hrs), Avg:98.1 F (36.7 C), Min:97.9 F (36.6 C), Max:98.3 F (36.8 C)  Recent Labs  Lab 12/27/22 1716  WBC 11.0*  CREATININE 0.97    Estimated Creatinine Clearance: 73.1 mL/min (by C-G formula based on SCr of 0.97 mg/dL).    Allergies  Allergen Reactions   Ketorolac Anaphylaxis, Swelling and Other (See Comments)    Throat swells   Prochlorperazine Anaphylaxis, Shortness Of Breath, Swelling and Other (See Comments)    Throat swelling   Promethazine Anaphylaxis, Swelling and Other (See Comments)    Throat swells   Sulfa Antibiotics Anaphylaxis   Toradol [Ketorolac Tromethamine] Anaphylaxis   Buprenorphine Nausea Only   Nsaids Nausea Only and Other (See Comments)    NSAIDS upset the stomach- can tolerate ibuprofen     Bertis Ruddy, PharmD, [place] Pharmacist ED Pharmacist Phone # [phone] 12/27/2022 10:55 PM

## 2022-12-27 NOTE — H&P (Addendum)
PCP:   [facility]   Chief Complaint:  Osteomyelitis right second toe  HPI: This is a 72 year old female with past medical history of hypertension, hypothyroidism, COPD, ADHD, morbid obesity.  Per patient approximately week and a half ago her right great second toe started getting swollen and painful.  She is up with podiatry for the first time on the 18th, presented to the ER for evaluation of osteomyelitis, treatment and possible amputation.  She has no history of trauma or prior infections.  X-ray is positive for bone destruction/osteomyelitis of the tuft of the distal right phalanx  Review of Systems:  The patient denies anorexia, fever, weight loss,, vision loss, decreased hearing, hoarseness, chest pain, syncope, dyspnea on exertion, peripheral edema, balance deficits, hemoptysis, abdominal pain, melena, hematochezia, severe indigestion/heartburn, hematuria, incontinence, genital sores, muscle weakness, suspicious skin lesions, transient blindness, difficulty walking, depression, unusual weight change, abnormal bleeding, enlarged lymph nodes, angioedema, and breast masses. Positives: Pain, swelling, tenderness, warmth right second toe  Past Medical History: Past Medical History:  Diagnosis Date   ADD (attention deficit disorder)    COPD (chronic obstructive pulmonary disease) ([place])    Hypertension    Hypothyroidism    Past Surgical History:  Procedure Laterality Date   A-FLUTTER ABLATION N/A 04/08/2018   Procedure: A-FLUTTER ABLATION;  Surgeon: Evans Lance, MD;  Location: [facility];  Service: Cardiovascular;  Laterality: N/A;   ABDOMINAL HYSTERECTOMY     age 67 d/t endometriosis   KNEE ARTHROSCOPY Right    NEUROMAL REMOVAL Right     Medications: Prior to Admission medications   Medication Sig Start Date End Date Taking? Authorizing Provider  acetaminophen (TYLENOL) 650 MG CR tablet Take 1,300 mg by mouth every 8 (eight) hours as  needed for pain.    [provider]  amphetamine-dextroamphetamine (ADDERALL) 30 MG tablet Take 1 tablet by mouth 2 (two) times daily. 05/14/22   Shelly Coss, MD  ASPIRIN LOW DOSE 81 MG EC tablet Take 81 mg by mouth daily. 11/02/19   [provider]  brimonidine (ALPHAGAN) 0.15 % ophthalmic solution Place 1 drop into both eyes 2 (two) times daily. 11/02/19   [provider]  citalopram (CELEXA) 20 MG tablet Take 20 mg by mouth daily.    [provider]  clindamycin (CLEOCIN) 300 MG capsule Take 300 mg by mouth 3 (three) times daily. 10/20/22   [provider]  doxycycline (VIBRA-TABS) 100 MG tablet Take 1 tablet (100 mg total) by mouth 2 (two) times daily. 12/26/22   McDonald, Stephan Minister, DPM  famotidine (PEPCID) 20 MG tablet Take 20 mg by mouth See admin instructions. Take 20 mg by mouth at bedtime 11/02/19   [provider]  furosemide (LASIX) 20 MG tablet Take 1 tablet (20 mg total) by mouth daily. Patient not taking: Reported on 05/10/2022 05/13/18 05/10/22  Evans Lance, MD  gentamicin ointment (GARAMYCIN) 0.1 % Apply 1 Application topically 3 (three) times daily. 11/25/22   McDonald, Stephan Minister, DPM  latanoprost (XALATAN) 0.005 % ophthalmic solution Place 1 drop into both eyes at bedtime. 11/02/19   [provider]  levothyroxine (SYNTHROID) 75 MCG tablet Take 75 mcg by mouth daily before breakfast.    [provider]  metoprolol succinate (TOPROL-XL) 25 MG 24 hr tablet Take 1 tablet (25 mg total) by mouth daily. Take with or immediately following a meal. 05/14/22   Shelly Coss, MD  nystatin (MYCOSTATIN/NYSTOP) powder Apply topically. 05/03/22 05/03/23  [provider]  oxybutynin (DITROPAN XL) 15 MG 24 hr tablet Take 15 mg by mouth daily.     [provider]  polyethylene glycol (MIRALAX / GLYCOLAX) 17 g packet Take 17 g by mouth daily as needed for mild constipation. 05/14/22   Shelly Coss, MD  pravastatin  (PRAVACHOL) 80 MG tablet Take 80 mg by mouth daily.    [provider]    Allergies:   Allergies  Allergen Reactions   Ketorolac Anaphylaxis, Swelling and Other (See Comments)    Throat swells   Prochlorperazine Anaphylaxis, Shortness Of Breath, Swelling and Other (See Comments)    Throat swelling   Promethazine Anaphylaxis, Swelling and Other (See Comments)    Throat swells   Sulfa Antibiotics Anaphylaxis   Toradol [Ketorolac Tromethamine] Anaphylaxis   Buprenorphine Nausea Only   Nsaids Nausea Only and Other (See Comments)    NSAIDS upset the stomach- can tolerate ibuprofen     Social History:  reports that she quit smoking about 9 years ago. Her smoking use included cigarettes. She has never used smokeless tobacco. She reports that she does not currently use alcohol. She reports that she does not currently use drugs.  Family History: Family History  Problem Relation Age of Onset   Cancer Other    Diabetes Other    Heart disease Other    Hypertension Other    Obesity Other    Alcoholism Other    Stroke Other    Hypercholesterolemia Other     Physical Exam: Vitals:   12/27/22 1649 12/27/22 1649 12/27/22 2243  BP:  (!) 153/59 121/67  Pulse:  65 (!) 55  Resp:  17 16  Temp:  97.9 F (36.6 C) 98.3 F (36.8 C)  TempSrc:  Oral Oral  SpO2:  95% 97%  Weight: 127 kg    Height: 5' 6.5" (1.689 m)      General:  Alert and oriented times three, no acute distress morbidly obese female, Eyes: PERRLA, pink conjunctiva, no scleral icterus ENT: Moist oral mucosa, neck supple, no thyromegaly Lungs: clear to ascultation, no wheeze, no crackles, no use of accessory muscles Cardiovascular: regular rate and rhythm, no regurgitation, no gallops, no murmurs. No carotid bruits, no JVD Abdomen: soft, positive BS, non-tender, non-distended, no organomegaly, not an acute abdomen GU: not examined Neuro: CN II - XII grossly intact, sensation intact Musculoskeletal: strength 5/5  all extremities, swollen right second toe, erythematous, mild.  Drainage from tip, crustaceans along feet Skin: no rash, no subcutaneous crepitation, no decubitus Psych: appropriate patient   Labs on Admission:  Recent Labs    12/27/22 1716  NA 132*  K 3.5  CL 102  CO2 21*  GLUCOSE 108*  BUN 18  CREATININE 0.97  CALCIUM 8.8*   Recent Labs    12/27/22 1716  AST 14*  ALT 10  ALKPHOS 66  BILITOT 0.4  PROT 7.0  ALBUMIN 3.3*    Recent Labs    12/27/22 1716  WBC 11.0*  NEUTROABS 7.4  HGB 11.6*  HCT 36.2  MCV 86.8  PLT 281    Micro Results: Recent Results (from the past 240 hour(s))  WOUND CULTURE     Status: None (Preliminary result)   Collection Time: 12/26/22 12:04 PM   Specimen: Abscess; Wound  Result Value Ref Range Status   MICRO NUMBER: [id number]  Preliminary   SPECIMEN QUALITY: Adequate  Preliminary   SOURCE: WOUND (SITE NOT SPECIFIED)  Preliminary   STATUS: PRELIMINARY  Preliminary  GRAM STAIN:   Preliminary    No white blood cells seen No epithelial cells seen Moderate Gram positive cocci in pairs Few Gram negative bacilli     Radiological Exams on Admission: DG Toe 2nd Right  Result Date: 12/27/2022 CLINICAL DATA:  Foot infection possible osteomyelitis EXAM: RIGHT SECOND TOE COMPARISON:  12/26/2022 FINDINGS: Significant soft tissue swelling second toe. Dressing artifacts. Bone destruction identified at distal phalanx with associated bone debris consistent with osteomyelitis. Degenerative changes DIP joint second toe without bone destruction. No fracture, dislocation or additional areas of bone destruction seen. IMPRESSION: Bone destruction involving the tuft of distal phalanx RIGHT second toe consistent with osteomyelitis. Electronically Signed   By: Lavonia Dana M.D.   On: 12/27/2022 18:03    Assessment/Plan Present on Admission:  Osteomyelitis right foot second phalanx (HCC) -Admit to MedSurg -Blood cultures collected -IV vancomycin and Zosyn  ordered -Pain meds as needed -N.p.o. at midnight -Podiatry consult placed, Dr. Sherryle Lis aware -Patient follows with vascular, notes written 12/12/2021: there is blood flow and she also has ABIs which are also consistent with adequate blood flow for tissue healing    Typical atrial flutter (HCC) -Metoprolol resumed -Status post ablation, 2019  Depression -Stable, Celexa ordered  ADD -Stable Adderall resumed  Hypothyroidism -Stable, Synthroid resumed  Dyslipidemia -Stable, statin resumed  Glaucoma -Eyedrops resumed   COPD (chronic obstructive pulmonary disease) ([place]) -Albuterol MDI -Breo resumed   Morbid (severe) obesity due to excess calories ([place])  Autumn Parrish 12/27/2022, 11:19 PM

## 2022-12-27 NOTE — Telephone Encounter (Signed)
Attempted to call the patient back, but no answer and  no vm set up.

## 2022-12-27 NOTE — Telephone Encounter (Signed)
Patient called and stated Dr. Sherryle Lis referred her to a Podiatrist in [facility] and she forgot the name. Please advise

## 2022-12-27 NOTE — ED Provider Triage Note (Signed)
Emergency Medicine Provider Triage Evaluation Note  Autumn Parrish , a 72 y.o. female  was evaluated in triage.  Pt complains of wound on right toe.  Patient reports wound present for the past months.  Followed by podiatry outpatient.  States that pain, swelling and redness has gotten worse over the past few days and was told to Emergency Department for further evaluation.  Denies fever, chills, weakness or sensory deficits in affected foot.  Not currently taking any antibiotics  Review of Systems  Positive: See above Negative:   Physical Exam  BP (!) 153/59 (BP Location: Right Arm)   Pulse 65   Temp 97.9 F (36.6 C) (Oral)   Resp 17   Ht 5' 6.5" (1.689 m)   Wt 127 kg   SpO2 95%   BMI 44.52 kg/m  Gen:   Awake, no distress   Resp:  Normal effort  MSK:   Moves extremities without difficulty  Other:  Warm, erythematous skin extending up right tibia        Medical Decision Making  Medically screening exam initiated at 5:08 PM.  Appropriate orders placed.  DRINDA BELGARD was informed that the remainder of the evaluation will be completed by another provider, this initial triage assessment does not replace that evaluation, and the importance of remaining in the ED until their evaluation is complete.     Wilnette Kales, [place] 12/27/22 1710

## 2022-12-27 NOTE — ED Provider Notes (Signed)
[facility] Provider Note   CSN: [id number] Arrival date & time: 12/27/22  1541     History  Chief Complaint  Patient presents with   Rt Foot Infection    Autumn Parrish is a 72 y.o. female.  The history is provided by the patient and medical records.   72 year old female with history of COPD, obesity, hypertension, thyroid disease, presenting to the ED with worsening infection in right second toe.  She has been following with podiatry, Dr. Sherryle Lis, for a few weeks now.  She was seen in his office yesterday with worsening wound and films that were concerning for osteomyelitis.  She was encouraged to come to the ED but delayed it until today as she was trying to find doctor at [facility].  She talked to office again today and encouraged her to come in.  She does have increased pain to right foot.  Denies fever/chills.    Home Medications Prior to Admission medications   Medication Sig Start Date End Date Taking? Authorizing Provider  acetaminophen (TYLENOL) 650 MG CR tablet Take 1,300 mg by mouth every 8 (eight) hours as needed for pain.    [provider]  amphetamine-dextroamphetamine (ADDERALL) 30 MG tablet Take 1 tablet by mouth 2 (two) times daily. 05/14/22   Shelly Coss, MD  ASPIRIN LOW DOSE 81 MG EC tablet Take 81 mg by mouth daily. 11/02/19   [provider]  brimonidine (ALPHAGAN) 0.15 % ophthalmic solution Place 1 drop into both eyes 2 (two) times daily. 11/02/19   [provider]  citalopram (CELEXA) 20 MG tablet Take 20 mg by mouth daily.    [provider]  clindamycin (CLEOCIN) 300 MG capsule Take 300 mg by mouth 3 (three) times daily. 10/20/22   [provider]  doxycycline (VIBRA-TABS) 100 MG tablet Take 1 tablet (100 mg total) by mouth 2 (two) times daily. 12/26/22   McDonald, Stephan Minister, DPM  famotidine (PEPCID) 20 MG tablet Take 20 mg by mouth See admin instructions. Take 20 mg  by mouth at bedtime 11/02/19   [provider]  furosemide (LASIX) 20 MG tablet Take 1 tablet (20 mg total) by mouth daily. Patient not taking: Reported on 05/10/2022 05/13/18 05/10/22  Evans Lance, MD  gentamicin ointment (GARAMYCIN) 0.1 % Apply 1 Application topically 3 (three) times daily. 11/25/22   McDonald, Stephan Minister, DPM  latanoprost (XALATAN) 0.005 % ophthalmic solution Place 1 drop into both eyes at bedtime. 11/02/19   [provider]  levothyroxine (SYNTHROID) 75 MCG tablet Take 75 mcg by mouth daily before breakfast.    [provider]  metoprolol succinate (TOPROL-XL) 25 MG 24 hr tablet Take 1 tablet (25 mg total) by mouth daily. Take with or immediately following a meal. 05/14/22   Shelly Coss, MD  nystatin (MYCOSTATIN/NYSTOP) powder Apply topically. 05/03/22 05/03/23  [provider]  oxybutynin (DITROPAN XL) 15 MG 24 hr tablet Take 15 mg by mouth daily.     [provider]  polyethylene glycol (MIRALAX / GLYCOLAX) 17 g packet Take 17 g by mouth daily as needed for mild constipation. 05/14/22   Shelly Coss, MD  pravastatin (PRAVACHOL) 80 MG tablet Take 80 mg by mouth daily.    [provider]      Allergies    Ketorolac, Prochlorperazine, Promethazine, Sulfa antibiotics, Toradol [ketorolac tromethamine], Buprenorphine, and Nsaids    Review of Systems   Review of Systems  Skin:  Positive  for wound.  All other systems reviewed and are negative.   Physical Exam Updated Vital Signs BP (!) 153/59 (BP Location: Right Arm)   Pulse 65   Temp 97.9 F (36.6 C) (Oral)   Resp 17   Ht 5' 6.5" (1.689 m)   Wt 127 kg   SpO2 95%   BMI 44.52 kg/m   Physical Exam Vitals and nursing note reviewed.  Constitutional:      Appearance: She is well-developed.  HENT:     Head: Normocephalic and atraumatic.  Eyes:     Conjunctiva/sclera: Conjunctivae normal.     Pupils: Pupils are equal, round, and reactive to light.  Cardiovascular:      Rate and Rhythm: Normal rate and regular rhythm.     Heart sounds: Normal heart sounds.  Pulmonary:     Effort: Pulmonary effort is normal.     Breath sounds: Normal breath sounds.  Abdominal:     General: Bowel sounds are normal.     Palpations: Abdomen is soft.  Musculoskeletal:        General: Normal range of motion.     Cervical back: Normal range of motion.     Comments: Wound to distal aspect of right 2nd toe, tip does have some necrotic appearing tissue, toe is swollen and cellulitic, small amount of purulent drainage present, some erythema extending to the 3rd and 4th toes and dorsal foot, DP pulse intact  Skin:    General: Skin is warm and dry.  Neurological:     Mental Status: She is alert and oriented to person, place, and time.          ED Results / Procedures / Treatments   Labs (all labs ordered are listed, but only abnormal results are displayed) Labs Reviewed  COMPREHENSIVE METABOLIC PANEL - Abnormal; Notable for the following components:      Result Value   Sodium 132 (*)    CO2 21 (*)    Glucose, Bld 108 (*)    Calcium 8.8 (*)    Albumin 3.3 (*)    AST 14 (*)    All other components within normal limits  CBC WITH DIFFERENTIAL/PLATELET - Abnormal; Notable for the following components:   WBC 11.0 (*)    Hemoglobin 11.6 (*)    Monocytes Absolute 1.3 (*)    All other components within normal limits  LACTIC ACID, PLASMA  LACTIC ACID, PLASMA    EKG None  Radiology DG Toe 2nd Right  Result Date: 12/27/2022 CLINICAL DATA:  Foot infection possible osteomyelitis EXAM: RIGHT SECOND TOE COMPARISON:  12/26/2022 FINDINGS: Significant soft tissue swelling second toe. Dressing artifacts. Bone destruction identified at distal phalanx with associated bone debris consistent with osteomyelitis. Degenerative changes DIP joint second toe without bone destruction. No fracture, dislocation or additional areas of bone destruction seen. IMPRESSION: Bone destruction  involving the tuft of distal phalanx RIGHT second toe consistent with osteomyelitis. Electronically Signed   By: Lavonia Dana M.D.   On: 12/27/2022 18:03    Procedures Procedures    CRITICAL CARE Performed by: Larene Pickett   Total critical care time: 35 minutes  Critical care time was exclusive of separately billable procedures and treating other patients.  Critical care was necessary to treat or prevent imminent or life-threatening deterioration.  Critical care was time spent personally by me on the following activities: development of treatment plan with patient and/or surrogate as well as nursing, discussions with consultants, evaluation of patient's response to treatment, examination  of patient, obtaining history from patient or surrogate, ordering and performing treatments and interventions, ordering and review of laboratory studies, ordering and review of radiographic studies, pulse oximetry and re-evaluation of patient's condition.   Medications Ordered in ED Medications  fentaNYL (SUBLIMAZE) injection 50 mcg (has no administration in time range)  piperacillin-tazobactam (ZOSYN) IVPB 3.375 g (has no administration in time range)  vancomycin (VANCOREADY) IVPB 2000 mg/400 mL (has no administration in time range)  vancomycin (VANCOREADY) IVPB 750 mg/150 mL (has no administration in time range)  piperacillin-tazobactam (ZOSYN) IVPB 3.375 g (has no administration in time range)    ED Course/ Medical Decision Making/ A&P                             Medical Decision Making Amount and/or Complexity of Data Reviewed Radiology: ordered and independent interpretation performed. ECG/medicine tests: ordered and independent interpretation performed.  Risk Prescription drug management. Decision regarding hospitalization.   72 y.o. F presenting to the ED for right 2nd toe infection.   Has been following with podiatry about this for a while now, had office follow-up yesterday and  sent to the ED for admission with IV abx and possible amputation.  She is afebrile, non-toxic.  Right 2nd toe with wound along distal tip, does appear to have some small areas of necrosis present, small amount of drainage.  Does have erythema extending to adjacent toes, dorsal foot, and up into lower leg.  Foot is NVI.  Labs as above-- WBC 11K, no electrolyte derangement.  Does have findings of osteomyelitis on films.  Podiatry will follow during admission.  Started IV vanc/zosyn.  Discussed with hospitalist, Dr. Claria Dice-- will admit for ongoing care.  Final Clinical Impression(s) / ED Diagnoses Final diagnoses:  Osteomyelitis of second toe of right foot [facility])    Rx / DC Orders ED Discharge Orders     None         Larene Pickett, PA-C 12/28/22 0003    Isla Pence, MD 12/28/22 [phone]

## 2022-12-27 NOTE — Telephone Encounter (Signed)
Per Dr. Maxie Barb previous encounter message response:  I advised her to go to the ER. If she wants to stay in [place] she should go to [place]. We will not be able to see her there but there will be a surgeon that can.  I tried to call her yesterday and this morning but there was no answer.

## 2022-12-27 NOTE — ED Triage Notes (Signed)
Pt came in [place] for a Rt foot infection that has been there for a couple of weeks. A/Ox4, rates pain 5/10, denies drainage or recent fevers. Ambulation not affected.

## 2022-12-28 ENCOUNTER — Inpatient Hospital Stay (HOSPITAL_COMMUNITY): Payer: Medicare (Managed Care)

## 2022-12-28 DIAGNOSIS — Z79899 Other long term (current) drug therapy: Secondary | ICD-10-CM | POA: Diagnosis not present

## 2022-12-28 DIAGNOSIS — M868X7 Other osteomyelitis, ankle and foot: Secondary | ICD-10-CM | POA: Diagnosis present

## 2022-12-28 DIAGNOSIS — E872 Acidosis, unspecified: Secondary | ICD-10-CM | POA: Diagnosis present

## 2022-12-28 DIAGNOSIS — E871 Hypo-osmolality and hyponatremia: Secondary | ICD-10-CM | POA: Diagnosis present

## 2022-12-28 DIAGNOSIS — J449 Chronic obstructive pulmonary disease, unspecified: Secondary | ICD-10-CM | POA: Diagnosis present

## 2022-12-28 DIAGNOSIS — M86171 Other acute osteomyelitis, right ankle and foot: Secondary | ICD-10-CM | POA: Diagnosis not present

## 2022-12-28 DIAGNOSIS — Z87891 Personal history of nicotine dependence: Secondary | ICD-10-CM | POA: Diagnosis not present

## 2022-12-28 DIAGNOSIS — Z886 Allergy status to analgesic agent status: Secondary | ICD-10-CM | POA: Diagnosis not present

## 2022-12-28 DIAGNOSIS — L97519 Non-pressure chronic ulcer of other part of right foot with unspecified severity: Secondary | ICD-10-CM | POA: Diagnosis present

## 2022-12-28 DIAGNOSIS — E860 Dehydration: Secondary | ICD-10-CM | POA: Diagnosis present

## 2022-12-28 DIAGNOSIS — Z888 Allergy status to other drugs, medicaments and biological substances status: Secondary | ICD-10-CM | POA: Diagnosis not present

## 2022-12-28 DIAGNOSIS — Z7982 Long term (current) use of aspirin: Secondary | ICD-10-CM | POA: Diagnosis not present

## 2022-12-28 DIAGNOSIS — M869 Osteomyelitis, unspecified: Secondary | ICD-10-CM | POA: Diagnosis present

## 2022-12-28 DIAGNOSIS — Z6841 Body Mass Index (BMI) 40.0 and over, adult: Secondary | ICD-10-CM | POA: Diagnosis not present

## 2022-12-28 DIAGNOSIS — F419 Anxiety disorder, unspecified: Secondary | ICD-10-CM | POA: Diagnosis present

## 2022-12-28 DIAGNOSIS — E039 Hypothyroidism, unspecified: Secondary | ICD-10-CM | POA: Diagnosis present

## 2022-12-28 DIAGNOSIS — I10 Essential (primary) hypertension: Secondary | ICD-10-CM | POA: Diagnosis present

## 2022-12-28 DIAGNOSIS — I483 Typical atrial flutter: Secondary | ICD-10-CM | POA: Diagnosis present

## 2022-12-28 DIAGNOSIS — F909 Attention-deficit hyperactivity disorder, unspecified type: Secondary | ICD-10-CM | POA: Diagnosis present

## 2022-12-28 DIAGNOSIS — E876 Hypokalemia: Secondary | ICD-10-CM | POA: Diagnosis present

## 2022-12-28 DIAGNOSIS — H409 Unspecified glaucoma: Secondary | ICD-10-CM | POA: Diagnosis present

## 2022-12-28 DIAGNOSIS — L03116 Cellulitis of left lower limb: Secondary | ICD-10-CM | POA: Diagnosis present

## 2022-12-28 DIAGNOSIS — F32A Depression, unspecified: Secondary | ICD-10-CM | POA: Diagnosis present

## 2022-12-28 DIAGNOSIS — E785 Hyperlipidemia, unspecified: Secondary | ICD-10-CM | POA: Diagnosis present

## 2022-12-28 DIAGNOSIS — Z882 Allergy status to sulfonamides status: Secondary | ICD-10-CM | POA: Diagnosis not present

## 2022-12-28 DIAGNOSIS — I4891 Unspecified atrial fibrillation: Secondary | ICD-10-CM | POA: Diagnosis not present

## 2022-12-28 LAB — CREATININE, SERUM
Creatinine, Ser: 0.81 mg/dL (ref 0.44–1.00)
GFR, Estimated: >60 mL/min (ref >60)

## 2022-12-28 LAB — BASIC METABOLIC PANEL
Anion gap: 12 (ref 5–15)
BUN: 17 mg/dL (ref 8–23)
CO2: 19 mmol/L — ABNORMAL LOW (ref 22–32)
Calcium: 9.1 mg/dL (ref 8.9–10.3)
Chloride: 102 mmol/L (ref 98–111)
Creatinine, Ser: 0.94 mg/dL (ref 0.44–1.00)
GFR, Estimated: >60 mL/min (ref >60)
Glucose, Bld: 115 mg/dL — ABNORMAL HIGH (ref 70–99)
Potassium: 3.4 mmol/L — ABNORMAL LOW (ref 3.5–5.1)
Sodium: 133 mmol/L — ABNORMAL LOW (ref 135–145)

## 2022-12-28 LAB — CBC WITH DIFFERENTIAL/PLATELET
Abs Immature Granulocytes: 0.04 10*3/uL (ref 0.00–0.07)
Basophils Absolute: 0.1 10*3/uL (ref 0.0–0.1)
Basophils Relative: 1 %
Eosinophils Absolute: 0.2 10*3/uL (ref 0.0–0.5)
Eosinophils Relative: 2 %
HCT: 35.9 % — ABNORMAL LOW (ref 36.0–46.0)
Hemoglobin: 12.2 g/dL (ref 12.0–15.0)
Immature Granulocytes: 0 %
Lymphocytes Relative: 22 %
Lymphs Abs: 2.6 10*3/uL (ref 0.7–4.0)
MCH: 28.8 pg (ref 26.0–34.0)
MCHC: 34 g/dL (ref 30.0–36.0)
MCV: 84.7 fL (ref 80.0–100.0)
Monocytes Absolute: 1.4 10*3/uL — ABNORMAL HIGH (ref 0.1–1.0)
Monocytes Relative: 12 %
Neutro Abs: 7.4 10*3/uL (ref 1.7–7.7)
Neutrophils Relative %: 63 %
Platelets: 288 10*3/uL (ref 150–400)
RBC: 4.24 MIL/uL (ref 3.87–5.11)
RDW: 13.8 % (ref 11.5–15.5)
WBC: 11.7 10*3/uL — ABNORMAL HIGH (ref 4.0–10.5)
nRBC: 0 % (ref 0.0–0.2)

## 2022-12-28 LAB — CBC
HCT: 34 % — ABNORMAL LOW (ref 36.0–46.0)
Hemoglobin: 11 g/dL — ABNORMAL LOW (ref 12.0–15.0)
MCH: 27.6 pg (ref 26.0–34.0)
MCHC: 32.4 g/dL (ref 30.0–36.0)
MCV: 85.2 fL (ref 80.0–100.0)
Platelets: 236 10*3/uL (ref 150–400)
RBC: 3.99 MIL/uL (ref 3.87–5.11)
RDW: 13.7 % (ref 11.5–15.5)
WBC: 8.5 10*3/uL (ref 4.0–10.5)
nRBC: 0 % (ref 0.0–0.2)

## 2022-12-28 LAB — LACTIC ACID, PLASMA
Lactic Acid, Venous: 1.3 mmol/L (ref 0.5–1.9)
Lactic Acid, Venous: 0.9 mmol/L (ref 0.5–1.9)

## 2022-12-28 LAB — SURGICAL PCR SCREEN
MRSA, PCR: NEGATIVE
Staphylococcus aureus: NEGATIVE

## 2022-12-28 LAB — MAGNESIUM: Magnesium: 1.8 mg/dL (ref 1.7–2.4)

## 2022-12-28 MED ORDER — POTASSIUM CHLORIDE 20 MEQ PO PACK
40.0000 meq | PACK | Freq: Once | ORAL | Status: AC
  Administered 2022-12-28: 40 meq via ORAL
  Filled 2022-12-28: qty 2

## 2022-12-28 MED ORDER — ONDANSETRON HCL 4 MG PO TABS: 4.0000 mg | ORAL_TABLET | Freq: Four times a day (QID) | ORAL | Status: DC | PRN

## 2022-12-28 MED ORDER — ENOXAPARIN SODIUM 40 MG/0.4ML IJ SOSY
40.0000 mg | PREFILLED_SYRINGE | INTRAMUSCULAR | Status: DC
  Administered 2022-12-28 – 2022-12-30 (×3): 40 mg via SUBCUTANEOUS
  Filled 2022-12-28 (×3): qty 0.4

## 2022-12-28 MED ORDER — PRAVASTATIN SODIUM 40 MG PO TABS
80.0000 mg | ORAL_TABLET | Freq: Every day | ORAL | Status: DC
  Administered 2022-12-28 – 2022-12-30 (×3): 80 mg via ORAL
  Filled 2022-12-28 (×3): qty 2

## 2022-12-28 MED ORDER — SENNOSIDES-DOCUSATE SODIUM 8.6-50 MG PO TABS: 1.0000 | ORAL_TABLET | Freq: Every evening | ORAL | Status: DC | PRN

## 2022-12-28 MED ORDER — AMPHETAMINE-DEXTROAMPHETAMINE 10 MG PO TABS: 30.0000 mg | ORAL_TABLET | Freq: Two times a day (BID) | ORAL | Status: DC

## 2022-12-28 MED ORDER — FLUTICASONE FUROATE-VILANTEROL 100-25 MCG/ACT IN AEPB
1.0000 | INHALATION_SPRAY | Freq: Every day | RESPIRATORY_TRACT | Status: DC
  Filled 2022-12-28: qty 28

## 2022-12-28 MED ORDER — LEVOTHYROXINE SODIUM 75 MCG PO TABS
75.0000 ug | ORAL_TABLET | Freq: Every day | ORAL | Status: DC
  Administered 2022-12-28 – 2022-12-30 (×3): 75 ug via ORAL
  Filled 2022-12-28 (×3): qty 1

## 2022-12-28 MED ORDER — BRIMONIDINE TARTRATE 0.15 % OP SOLN
1.0000 [drp] | Freq: Two times a day (BID) | OPHTHALMIC | Status: DC
  Administered 2022-12-28 – 2022-12-30 (×5): 1 [drp] via OPHTHALMIC
  Filled 2022-12-28: qty 5

## 2022-12-28 MED ORDER — LATANOPROST 0.005 % OP SOLN
1.0000 [drp] | Freq: Every day | OPHTHALMIC | Status: DC
  Administered 2022-12-28 – 2022-12-29 (×2): 1 [drp] via OPHTHALMIC
  Filled 2022-12-28: qty 2.5

## 2022-12-28 MED ORDER — ACETAMINOPHEN 650 MG RE SUPP: 650.0000 mg | Freq: Four times a day (QID) | RECTAL | Status: DC | PRN

## 2022-12-28 MED ORDER — ACETAMINOPHEN 325 MG PO TABS
650.0000 mg | ORAL_TABLET | Freq: Four times a day (QID) | ORAL | Status: DC | PRN
  Administered 2022-12-28 – 2022-12-29 (×3): 650 mg via ORAL
  Filled 2022-12-28 (×3): qty 2

## 2022-12-28 MED ORDER — ONDANSETRON HCL 4 MG/2ML IJ SOLN: 4.0000 mg | Freq: Four times a day (QID) | INTRAMUSCULAR | Status: DC | PRN

## 2022-12-28 MED ORDER — MORPHINE SULFATE (PF) 2 MG/ML IV SOLN
2.0000 mg | INTRAVENOUS | Status: DC | PRN
  Administered 2022-12-28 – 2022-12-30 (×10): 2 mg via INTRAVENOUS
  Filled 2022-12-28 (×10): qty 1

## 2022-12-28 MED ORDER — CITALOPRAM HYDROBROMIDE 20 MG PO TABS
20.0000 mg | ORAL_TABLET | Freq: Every day | ORAL | Status: DC
  Administered 2022-12-28 – 2022-12-30 (×3): 20 mg via ORAL
  Filled 2022-12-28 (×3): qty 1

## 2022-12-28 MED ORDER — VANCOMYCIN HCL 750 MG/150ML IV SOLN
750.0000 mg | Freq: Two times a day (BID) | INTRAVENOUS | Status: DC
  Administered 2022-12-29 – 2022-12-30 (×3): 750 mg via INTRAVENOUS
  Filled 2022-12-28 (×4): qty 150

## 2022-12-28 MED ORDER — VANCOMYCIN HCL 2000 MG/400ML IV SOLN
2000.0000 mg | Freq: Once | INTRAVENOUS | Status: AC
  Administered 2022-12-28: 2000 mg via INTRAVENOUS
  Filled 2022-12-28: qty 400

## 2022-12-28 MED ORDER — AMPHETAMINE-DEXTROAMPHETAMINE 10 MG PO TABS
30.0000 mg | ORAL_TABLET | Freq: Every day | ORAL | Status: DC
  Administered 2022-12-28 – 2022-12-30 (×3): 30 mg via ORAL
  Filled 2022-12-28 (×3): qty 3

## 2022-12-28 MED ORDER — OXYBUTYNIN CHLORIDE ER 5 MG PO TB24
15.0000 mg | ORAL_TABLET | Freq: Every day | ORAL | Status: DC
  Administered 2022-12-28 – 2022-12-30 (×3): 15 mg via ORAL
  Filled 2022-12-28 (×4): qty 1

## 2022-12-28 MED ORDER — METOPROLOL SUCCINATE ER 25 MG PO TB24
25.0000 mg | ORAL_TABLET | Freq: Every day | ORAL | Status: DC
  Administered 2022-12-30: 25 mg via ORAL
  Filled 2022-12-28 (×3): qty 1

## 2022-12-28 NOTE — Progress Notes (Signed)
Patient was admitted on the unit. Patient was oriented to the hospital and denie having any questions.

## 2022-12-28 NOTE — ED Notes (Signed)
75[facility] stated they are ready for patient.

## 2022-12-28 NOTE — Anesthesia Preprocedure Evaluation (Addendum)
Anesthesia Evaluation  Patient identified by MRN, date of birth, ID band Patient awake    Reviewed: Allergy & Precautions, NPO status , Patient's Chart, lab work & pertinent test results  History of Anesthesia Complications Negative for: history of anesthetic complications  Airway Mallampati: II  TM Distance: >3 FB Neck ROM: Full    Dental  (+) Dental Advisory Given, Teeth Intact   Pulmonary COPD,  COPD inhaler, former smoker   Pulmonary exam normal        Cardiovascular hypertension, Pt. on home beta blockers and Pt. on medications Normal cardiovascular exam+ dysrhythmias Atrial Fibrillation      Neuro/Psych negative neurological ROS  negative psych ROS   GI/Hepatic negative GI ROS, Neg liver ROS,,,  Endo/Other  Hypothyroidism    Renal/GU negative Renal ROS     Musculoskeletal  (+) Arthritis ,    Abdominal   Peds  (+) ATTENTION DEFICIT DISORDER WITHOUT HYPERACTIVITY Hematology  (+) Blood dyscrasia, anemia   Anesthesia Other Findings   Reproductive/Obstetrics                             Anesthesia Physical Anesthesia Plan  ASA: 3  Anesthesia Plan: MAC   Post-op Pain Management: Tylenol PO (pre-op)* and Regional block*   Induction:   PONV Risk Score and Plan: 2 and Propofol infusion and Treatment may vary due to age or medical condition  Airway Management Planned: Natural Airway and Simple Face Mask  Additional Equipment: None  Intra-op Plan:   Post-operative Plan:   Informed Consent: I have reviewed the patients History and Physical, chart, labs and discussed the procedure including the risks, benefits and alternatives for the proposed anesthesia with the patient or authorized representative who has indicated his/her understanding and acceptance.       Plan Discussed with: CRNA and Anesthesiologist  Anesthesia Plan Comments:        Anesthesia Quick  Evaluation

## 2022-12-28 NOTE — ED Notes (Signed)
ED TO INPATIENT HANDOFF REPORT  ED Nurse Name and Phone #: Colletta [place] [phone]  S Name/Age/Gender Autumn Parrish 72 y.o. female Room/Bed: 016C/016C  Code Status   Code Status: Full Code  Home/SNF/Other Home Patient oriented to: self, place, time, and situation Is this baseline? Yes   Triage Complete: Triage complete  Chief Complaint Osteomyelitis [facility]) [M86.9]  Triage Note Pt came in [place] for a Rt foot infection that has been there for a couple of weeks. A/Ox4, rates pain 5/10, denies drainage or recent fevers. Ambulation not affected.    Allergies Allergies  Allergen Reactions   Ketorolac Anaphylaxis, Swelling and Other (See Comments)    Throat swelling  Patient states that she can take this   Prochlorperazine Anaphylaxis, Shortness Of Breath, Swelling and Other (See Comments)    Throat swelling  Patient states that this causes nausea only.   Promethazine Anaphylaxis, Swelling and Other (See Comments)    Throat swells  Patient states that this causes nausea only.   Sulfa Antibiotics Anaphylaxis   Toradol [Ketorolac Tromethamine] Anaphylaxis   Buprenorphine Nausea Only    Patient states that she has no reaction to this.   Nsaids Other (See Comments)    NSAIDS upset the stomach- can tolerate ibuprofen     Level of Care/Admitting Diagnosis ED Disposition     ED Disposition  Admit   Condition  --   [place]: [place] [[id number]]  Level of Care: Med-Surg [16]  May admit patient to [facility] or [facility] if equivalent level of care is available:: Yes  Covid Evaluation: Confirmed COVID Negative  Diagnosis: Osteomyelitis [facility]) [[id number]]  Admitting Physician: Haywood Pao  Attending Physician: Quintella Baton [[id number]]  Certification:: I certify this patient will need inpatient services for at least 2 midnights  Estimated Length of Stay: 2          B Medical/Surgery History Past Medical History:  Diagnosis Date   ADD  (attention deficit disorder)    COPD (chronic obstructive pulmonary disease) ([place])    Hypertension    Hypothyroidism    Past Surgical History:  Procedure Laterality Date   A-FLUTTER ABLATION N/A 04/08/2018   Procedure: A-FLUTTER ABLATION;  Surgeon: Evans Lance, MD;  Location: [place] [facility];  Service: Cardiovascular;  Laterality: N/A;   ABDOMINAL HYSTERECTOMY     age 53 d/t endometriosis   KNEE ARTHROSCOPY Right    NEUROMAL REMOVAL Right      A IV Location/Drains/Wounds Patient Lines/Drains/Airways Status     Active Line/Drains/Airways     Name Placement date Placement time Site Days   Peripheral IV 12/28/22 20 G Right Forearm 12/28/22  0212  Forearm  less than 1            Intake/Output Last 24 hours No intake or output data in the 24 hours ending 12/28/22 0243  Labs/Imaging Results for orders placed or performed during the hospital encounter of 12/27/22 (from the past 48 hour(s))  Comprehensive metabolic panel     Status: Abnormal   Collection Time: 12/27/22  5:16 PM  Result Value Ref Range   Sodium 132 (L) 135 - 145 mmol/L   Potassium 3.5 3.5 - 5.1 mmol/L   Chloride 102 98 - 111 mmol/L   CO2 21 (L) 22 - 32 mmol/L   Glucose, Bld 108 (H) 70 - 99 mg/dL    Comment: Glucose reference range applies only to samples taken after fasting for at least 8 hours.  BUN 18 8 - 23 mg/dL   Creatinine, Ser 0.97 0.44 - 1.00 mg/dL   Calcium 8.8 (L) 8.9 - 10.3 mg/dL   Total Protein 7.0 6.5 - 8.1 g/dL   Albumin 3.3 (L) 3.5 - 5.0 g/dL   AST 14 (L) 15 - 41 U/L   ALT 10 0 - 44 U/L   Alkaline Phosphatase 66 38 - 126 U/L   Total Bilirubin 0.4 0.3 - 1.2 mg/dL   GFR, Estimated >60 >60 mL/min    Comment: (NOTE) Calculated using the CKD-EPI Creatinine Equation (2021)    Anion gap 9 5 - 15    Comment: Performed at [facility], [address], [place], [place] [address]  CBC with Differential     Status: Abnormal   Collection Time: 12/27/22  5:16 PM  Result Value Ref Range    WBC 11.0 (H) 4.0 - 10.5 K/uL   RBC 4.17 3.87 - 5.11 MIL/uL   Hemoglobin 11.6 (L) 12.0 - 15.0 g/dL   HCT 36.2 36.0 - 46.0 %   MCV 86.8 80.0 - 100.0 fL   MCH 27.8 26.0 - 34.0 pg   MCHC 32.0 30.0 - 36.0 g/dL   RDW 13.7 11.5 - 15.5 %   Platelets 281 150 - 400 K/uL   nRBC 0.0 0.0 - 0.2 %   Neutrophils Relative % 68 %   Neutro Abs 7.4 1.7 - 7.7 K/uL   Lymphocytes Relative 18 %   Lymphs Abs 2.0 0.7 - 4.0 K/uL   Monocytes Relative 12 %   Monocytes Absolute 1.3 (H) 0.1 - 1.0 K/uL   Eosinophils Relative 1 %   Eosinophils Absolute 0.1 0.0 - 0.5 K/uL   Basophils Relative 1 %   Basophils Absolute 0.1 0.0 - 0.1 K/uL   Immature Granulocytes 0 %   Abs Immature Granulocytes 0.04 0.00 - 0.07 K/uL    Comment: Performed at [facility] [address], [place], [place] [address]  CBC with Differential/Platelet     Status: Abnormal   Collection Time: 12/28/22  2:07 AM  Result Value Ref Range   WBC 11.7 (H) 4.0 - 10.5 K/uL   RBC 4.24 3.87 - 5.11 MIL/uL   Hemoglobin 12.2 12.0 - 15.0 g/dL   HCT 35.9 (L) 36.0 - 46.0 %   MCV 84.7 80.0 - 100.0 fL   MCH 28.8 26.0 - 34.0 pg   MCHC 34.0 30.0 - 36.0 g/dL   RDW 13.8 11.5 - 15.5 %   Platelets 288 150 - 400 K/uL   nRBC 0.0 0.0 - 0.2 %   Neutrophils Relative % 63 %   Neutro Abs 7.4 1.7 - 7.7 K/uL   Lymphocytes Relative 22 %   Lymphs Abs 2.6 0.7 - 4.0 K/uL   Monocytes Relative 12 %   Monocytes Absolute 1.4 (H) 0.1 - 1.0 K/uL   Eosinophils Relative 2 %   Eosinophils Absolute 0.2 0.0 - 0.5 K/uL   Basophils Relative 1 %   Basophils Absolute 0.1 0.0 - 0.1 K/uL   Immature Granulocytes 0 %   Abs Immature Granulocytes 0.04 0.00 - 0.07 K/uL    Comment: Performed at [facility] [address] [place], [place] [address]   DG Toe 2nd Right  Result Date: 12/27/2022 CLINICAL DATA:  Foot infection possible osteomyelitis EXAM: RIGHT SECOND TOE COMPARISON:  12/26/2022 FINDINGS: Significant soft tissue swelling second toe. Dressing artifacts. Bone  destruction identified at distal phalanx with associated bone debris consistent with osteomyelitis.  Degenerative changes DIP joint second toe without bone destruction. No fracture, dislocation or additional areas of bone destruction seen. IMPRESSION: Bone destruction involving the tuft of distal phalanx RIGHT second toe consistent with osteomyelitis. Electronically Signed   By: Lavonia Dana M.D.   On: 12/27/2022 18:03    Pending Labs Unresulted Labs (From admission, onward)     Start     Ordered   01/04/23 0500  Creatinine, serum  (enoxaparin (LOVENOX)    CrCl >/= 30 ml/min)  Weekly,   R     Comments: while on enoxaparin therapy    12/28/22 0038   12/28/22 0539  Basic metabolic panel  Tomorrow morning,   R        12/28/22 0038   12/28/22 0500  Magnesium  Tomorrow morning,   R        12/28/22 0038   12/28/22 0038  CBC  (enoxaparin (LOVENOX)    CrCl >/= 30 ml/min)  Once,   R       Comments: Baseline for enoxaparin therapy IF NOT ALREADY DRAWN.  Notify MD if PLT < 100 K.    12/28/22 0038   12/28/22 0038  Creatinine, serum  (enoxaparin (LOVENOX)    CrCl >/= 30 ml/min)  Once,   R       Comments: Baseline for enoxaparin therapy IF NOT ALREADY DRAWN.    12/28/22 0038   12/27/22 1709  Lactic acid, plasma  Now then every 2 hours,   R      12/27/22 1708            Vitals/Pain Today's Vitals   12/27/22 1652 12/27/22 2243 12/28/22 0205 12/28/22 0211  BP:  121/67 (!) 117/93   Pulse:  (!) 55 (!) 58   Resp:  16 18   Temp:  98.3 F (36.8 C) 98.1 F (36.7 C)   TempSrc:  Oral Oral   SpO2:  97% 96%   Weight:      Height:      PainSc: 5    8     Isolation Precautions No active isolations  Medications Medications  vancomycin (VANCOREADY) IVPB 2000 mg/400 mL (has no administration in time range)  vancomycin (VANCOREADY) IVPB 750 mg/150 mL (has no administration in time range)  piperacillin-tazobactam (ZOSYN) IVPB 3.375 g (has no administration in time range)   amphetamine-dextroamphetamine (ADDERALL) tablet 1 tablet (has no administration in time range)  citalopram (CELEXA) tablet 20 mg (has no administration in time range)  levothyroxine (SYNTHROID) tablet 75 mcg (has no administration in time range)  metoprolol succinate (TOPROL-XL) 24 hr tablet 25 mg (has no administration in time range)  oxybutynin (DITROPAN-XL) 24 hr tablet 15 mg (has no administration in time range)  pravastatin (PRAVACHOL) tablet 80 mg (has no administration in time range)  brimonidine (ALPHAGAN) 0.15 % ophthalmic solution 1 drop (has no administration in time range)  latanoprost (XALATAN) 0.005 % ophthalmic solution 1 drop (has no administration in time range)  morphine (PF) 2 MG/ML injection 2 mg (has no administration in time range)  enoxaparin (LOVENOX) injection 40 mg (has no administration in time range)  acetaminophen (TYLENOL) tablet 650 mg (has no administration in time range)    Or  acetaminophen (TYLENOL) suppository 650 mg (has no administration in time range)  senna-docusate (Senokot-S) tablet 1 tablet (has no administration in time range)  ondansetron (ZOFRAN) tablet 4 mg (has no administration in time range)    Or  ondansetron (ZOFRAN) injection 4 mg (has no administration  in time range)  fluticasone furoate-vilanterol (BREO ELLIPTA) 100-25 MCG/ACT 1 puff (has no administration in time range)  fentaNYL (SUBLIMAZE) injection 50 mcg (50 mcg Intravenous Given 12/28/22 0212)  piperacillin-tazobactam (ZOSYN) IVPB 3.375 g (3.375 g Intravenous New Bag/Given 12/28/22 0213)    Mobility walks with device     Focused Assessments     R Recommendations: See Admitting Provider Note  Report given to:   Additional Notes:

## 2022-12-28 NOTE — ED Notes (Signed)
ED TO INPATIENT HANDOFF REPORT  ED Nurse Name and Phone #: [phone]  S Name/Age/Gender Autumn Parrish 72 y.o. female Room/Bed: 003C/003C  Code Status   Code Status: Full Code  Home/SNF/Other Home Patient oriented to: self Is this baseline? Yes   Triage Complete: Triage complete  Chief Complaint Osteomyelitis [facility]) [M86.9]  Triage Note Pt came in [place] for a Rt foot infection that has been there for a couple of weeks. A/Ox4, rates pain 5/10, denies drainage or recent fevers. Ambulation not affected.    Allergies Allergies  Allergen Reactions   Ketorolac Anaphylaxis, Swelling and Other (See Comments)    Throat swelling  Patient states that she can take this   Prochlorperazine Anaphylaxis, Shortness Of Breath, Swelling and Other (See Comments)    Throat swelling  Patient states that this causes nausea only.   Promethazine Anaphylaxis, Swelling and Other (See Comments)    Throat swells  Patient states that this causes nausea only.   Sulfa Antibiotics Anaphylaxis   Toradol [Ketorolac Tromethamine] Anaphylaxis   Buprenorphine Nausea Only    Patient states that she has no reaction to this.   Nsaids Other (See Comments)    NSAIDS upset the stomach- can tolerate ibuprofen     Level of Care/Admitting Diagnosis ED Disposition     ED Disposition  Admit   Condition  --   [place]: [place] [[id number]]  Level of Care: Med-Surg [16]  May admit patient to [facility] or [facility] if equivalent level of care is available:: Yes  Covid Evaluation: Confirmed COVID Negative  Diagnosis: Osteomyelitis [facility]) [[id number]]  Admitting Physician: Haywood Pao  Attending Physician: Quintella Baton [[id number]]  Certification:: I certify this patient will need inpatient services for at least 2 midnights  Estimated Length of Stay: 2          B Medical/Surgery History Past Medical History:  Diagnosis Date   ADD (attention deficit disorder)     COPD (chronic obstructive pulmonary disease) ([place])    Hypertension    Hypothyroidism    Past Surgical History:  Procedure Laterality Date   A-FLUTTER ABLATION N/A 04/08/2018   Procedure: A-FLUTTER ABLATION;  Surgeon: Evans Lance, MD;  Location: [place] [facility];  Service: Cardiovascular;  Laterality: N/A;   ABDOMINAL HYSTERECTOMY     age 3 d/t endometriosis   KNEE ARTHROSCOPY Right    NEUROMAL REMOVAL Right      A IV Location/Drains/Wounds Patient Lines/Drains/Airways Status     Active Line/Drains/Airways     Name Placement date Placement time Site Days   Peripheral IV 12/28/22 20 G Right Forearm 12/28/22  0212  Forearm  less than 1            Intake/Output Last 24 hours No intake or output data in the 24 hours ending 12/28/22 0353  Labs/Imaging Results for orders placed or performed during the hospital encounter of 12/27/22 (from the past 48 hour(s))  Comprehensive metabolic panel     Status: Abnormal   Collection Time: 12/27/22  5:16 PM  Result Value Ref Range   Sodium 132 (L) 135 - 145 mmol/L   Potassium 3.5 3.5 - 5.1 mmol/L   Chloride 102 98 - 111 mmol/L   CO2 21 (L) 22 - 32 mmol/L   Glucose, Bld 108 (H) 70 - 99 mg/dL    Comment: Glucose reference range applies only to samples taken after fasting for at least 8 hours.   BUN 18 8 -  23 mg/dL   Creatinine, Ser 0.97 0.44 - 1.00 mg/dL   Calcium 8.8 (L) 8.9 - 10.3 mg/dL   Total Protein 7.0 6.5 - 8.1 g/dL   Albumin 3.3 (L) 3.5 - 5.0 g/dL   AST 14 (L) 15 - 41 U/L   ALT 10 0 - 44 U/L   Alkaline Phosphatase 66 38 - 126 U/L   Total Bilirubin 0.4 0.3 - 1.2 mg/dL   GFR, Estimated >60 >60 mL/min    Comment: (NOTE) Calculated using the CKD-EPI Creatinine Equation (2021)    Anion gap 9 5 - 15    Comment: Performed at [facility], [place] [address], [place], [place] [address]  CBC with Differential     Status: Abnormal   Collection Time: 12/27/22  5:16 PM  Result Value Ref Range   WBC 11.0 (H) 4.0 - 10.5 K/uL    RBC 4.17 3.87 - 5.11 MIL/uL   Hemoglobin 11.6 (L) 12.0 - 15.0 g/dL   HCT 36.2 36.0 - 46.0 %   MCV 86.8 80.0 - 100.0 fL   MCH 27.8 26.0 - 34.0 pg   MCHC 32.0 30.0 - 36.0 g/dL   RDW 13.7 11.5 - 15.5 %   Platelets 281 150 - 400 K/uL   nRBC 0.0 0.0 - 0.2 %   Neutrophils Relative % 68 %   Neutro Abs 7.4 1.7 - 7.7 K/uL   Lymphocytes Relative 18 %   Lymphs Abs 2.0 0.7 - 4.0 K/uL   Monocytes Relative 12 %   Monocytes Absolute 1.3 (H) 0.1 - 1.0 K/uL   Eosinophils Relative 1 %   Eosinophils Absolute 0.1 0.0 - 0.5 K/uL   Basophils Relative 1 %   Basophils Absolute 0.1 0.0 - 0.1 K/uL   Immature Granulocytes 0 %   Abs Immature Granulocytes 0.04 0.00 - 0.07 K/uL    Comment: Performed at [facility] [address] [place], [place] [address]  Lactic acid, plasma     Status: None   Collection Time: 12/28/22  2:07 AM  Result Value Ref Range   Lactic Acid, Venous 1.3 0.5 - 1.9 mmol/L    Comment: Performed at [facility] [address], [place], [place] [address]  Basic metabolic panel     Status: Abnormal   Collection Time: 12/28/22  2:07 AM  Result Value Ref Range   Sodium 133 (L) 135 - 145 mmol/L   Potassium 3.4 (L) 3.5 - 5.1 mmol/L   Chloride 102 98 - 111 mmol/L   CO2 19 (L) 22 - 32 mmol/L   Glucose, Bld 115 (H) 70 - 99 mg/dL    Comment: Glucose reference range applies only to samples taken after fasting for at least 8 hours.   BUN 17 8 - 23 mg/dL   Creatinine, Ser 0.94 0.44 - 1.00 mg/dL   Calcium 9.1 8.9 - 10.3 mg/dL   GFR, Estimated >60 >60 mL/min    Comment: (NOTE) Calculated using the CKD-EPI Creatinine Equation (2021)    Anion gap 12 5 - 15    Comment: Performed at [facility] [address] [place], [place] [address]  Magnesium     Status: None   Collection Time: 12/28/22  2:07 AM  Result Value Ref Range   Magnesium 1.8 1.7 - 2.4 mg/dL    Comment: Performed at [facility] [address], [place], [place] [address]  CBC with  Differential/Platelet     Status: Abnormal  Collection Time: 12/28/22  2:07 AM  Result Value Ref Range   WBC 11.7 (H) 4.0 - 10.5 K/uL   RBC 4.24 3.87 - 5.11 MIL/uL   Hemoglobin 12.2 12.0 - 15.0 g/dL   HCT 35.9 (L) 36.0 - 46.0 %   MCV 84.7 80.0 - 100.0 fL   MCH 28.8 26.0 - 34.0 pg   MCHC 34.0 30.0 - 36.0 g/dL   RDW 13.8 11.5 - 15.5 %   Platelets 288 150 - 400 K/uL   nRBC 0.0 0.0 - 0.2 %   Neutrophils Relative % 63 %   Neutro Abs 7.4 1.7 - 7.7 K/uL   Lymphocytes Relative 22 %   Lymphs Abs 2.6 0.7 - 4.0 K/uL   Monocytes Relative 12 %   Monocytes Absolute 1.4 (H) 0.1 - 1.0 K/uL   Eosinophils Relative 2 %   Eosinophils Absolute 0.2 0.0 - 0.5 K/uL   Basophils Relative 1 %   Basophils Absolute 0.1 0.0 - 0.1 K/uL   Immature Granulocytes 0 %   Abs Immature Granulocytes 0.04 0.00 - 0.07 K/uL    Comment: Performed at [facility], [address], [place], [place] [address]   DG Toe 2nd Right  Result Date: 12/27/2022 CLINICAL DATA:  Foot infection possible osteomyelitis EXAM: RIGHT SECOND TOE COMPARISON:  12/26/2022 FINDINGS: Significant soft tissue swelling second toe. Dressing artifacts. Bone destruction identified at distal phalanx with associated bone debris consistent with osteomyelitis. Degenerative changes DIP joint second toe without bone destruction. No fracture, dislocation or additional areas of bone destruction seen. IMPRESSION: Bone destruction involving the tuft of distal phalanx RIGHT second toe consistent with osteomyelitis. Electronically Signed   By: Lavonia Dana M.D.   On: 12/27/2022 18:03    Pending Labs Unresulted Labs (From admission, onward)     Start     Ordered   01/04/23 0500  Creatinine, serum  (enoxaparin (LOVENOX)    CrCl >/= 30 ml/min)  Weekly,   R     Comments: while on enoxaparin therapy    12/28/22 0038   12/28/22 0038  CBC  (enoxaparin (LOVENOX)    CrCl >/= 30 ml/min)  Once,   R       Comments: Baseline for enoxaparin therapy IF NOT ALREADY DRAWN.   Notify MD if PLT < 100 K.    12/28/22 0038   12/28/22 0038  Creatinine, serum  (enoxaparin (LOVENOX)    CrCl >/= 30 ml/min)  Once,   R       Comments: Baseline for enoxaparin therapy IF NOT ALREADY DRAWN.    12/28/22 0038   12/27/22 1709  Lactic acid, plasma  Now then every 2 hours,   R (with STAT occurrences)      12/27/22 1708            Vitals/Pain Today's Vitals   12/27/22 1652 12/27/22 2243 12/28/22 0205 12/28/22 0211  BP:  121/67 (!) 117/93   Pulse:  (!) 55 (!) 58   Resp:  16 18   Temp:  98.3 F (36.8 C) 98.1 F (36.7 C)   TempSrc:  [place]   SpO2:  97% 96%   Weight:      Height:      PainSc: 5    8     Isolation Precautions No active isolations  Medications Medications  vancomycin (VANCOREADY) IVPB 2000 mg/400 mL (has no administration in time range)  vancomycin (VANCOREADY) IVPB 750 mg/150 mL (has no administration in time range)  piperacillin-tazobactam (  ZOSYN) IVPB 3.375 g (has no administration in time range)  amphetamine-dextroamphetamine (ADDERALL) tablet 1 tablet (has no administration in time range)  citalopram (CELEXA) tablet 20 mg (has no administration in time range)  levothyroxine (SYNTHROID) tablet 75 mcg (has no administration in time range)  metoprolol succinate (TOPROL-XL) 24 hr tablet 25 mg (has no administration in time range)  oxybutynin (DITROPAN-XL) 24 hr tablet 15 mg (has no administration in time range)  pravastatin (PRAVACHOL) tablet 80 mg (has no administration in time range)  brimonidine (ALPHAGAN) 0.15 % ophthalmic solution 1 drop (has no administration in time range)  latanoprost (XALATAN) 0.005 % ophthalmic solution 1 drop (has no administration in time range)  morphine (PF) 2 MG/ML injection 2 mg (has no administration in time range)  enoxaparin (LOVENOX) injection 40 mg (has no administration in time range)  acetaminophen (TYLENOL) tablet 650 mg (has no administration in time range)    Or  acetaminophen (TYLENOL) suppository  650 mg (has no administration in time range)  senna-docusate (Senokot-S) tablet 1 tablet (has no administration in time range)  ondansetron (ZOFRAN) tablet 4 mg (has no administration in time range)    Or  ondansetron (ZOFRAN) injection 4 mg (has no administration in time range)  fluticasone furoate-vilanterol (BREO ELLIPTA) 100-25 MCG/ACT 1 puff (has no administration in time range)  fentaNYL (SUBLIMAZE) injection 50 mcg (50 mcg Intravenous Given 12/28/22 0212)  piperacillin-tazobactam (ZOSYN) IVPB 3.375 g (3.375 g Intravenous New Bag/Given 12/28/22 0213)    Mobility walks     Focused Assessments Please read chart, I just got this pt and she is in bed sleeping.    R Recommendations: See Admitting Provider Note  Report given to:   Additional Notes:

## 2022-12-28 NOTE — Progress Notes (Signed)
PROGRESS NOTE    Autumn Parrish  TOI:[id number] DOB: 1951-07-16 DOA: 12/27/2022 PCP: [facility]   Brief Narrative:  This is a 72 year old female with past medical history of hypertension, hypothyroidism, COPD, ADHD, morbid obesity.  Per patient approximately week and a half ago her right great second toe started getting swollen and painful.  She is up with podiatry for the first time on the 18th, presented to the ER for evaluation of osteomyelitis, treatment and possible amputation.  She has no history of trauma or prior infections.   X-ray is positive for bone destruction/osteomyelitis of the tuft of the distal right phalanx.  Podiatry consulted.  Patient admitted for further evaluation and management of osteomyelitis of right distal phalanx.  Assessment & Plan:   Right second toe osteomyelitis: -Reviewed MRI brain. -She is afebrile.  Vital signs stable.  No signs of sepsis.  Has some leukocytosis -Will continue Vanco and Zosyn at this time. -N.p.o. after midnight -Podiatry is on board-appreciate help.  Recommend surgery tomorrow.  Atrial flutter: -Status post ablation in 2019.  Continue metoprolol  Depression: Continue Celexa  ADHD: Continue Adderall  Hypothyroidism: Continue Synthyroid  Hyperlipidemia: Continue statins  History of glaucoma continue home eardrops  COPD: On room air.  No wheezing noted on exam.  Stable.:  On room air.  No wheezing noted on exam.  Stable.  Continue home inhalers  Morbid obesity with BMI of 44: Diet modification, exercise and weight loss recommended.  Hyponatremia: Sodium 133.  Continue to monitor  Hypokalemia: Replenished.  Check magnesium level: Repeat BMP tomorrow a.m.  DVT prophylaxis: Lovenox Code Status: Full code Family Communication:  None present at bedside.  Plan of care discussed with patient in length and she verbalized understanding and agreed with it. Disposition Plan: To be  determined  Consultants:  Podiatry  Procedures:  None  Antimicrobials:  Vancomycin Zosyn  Status is: Inpatient   Subjective: Patient seen and examined.  Sitting comfortably on the bed.  Ready to go for MRI of right foot.  She denies any fever or chills.  Has some mild pain in the right foot.  No acute events overnight.  Objective: Vitals:   12/28/22 0205 12/28/22 0300 12/28/22 0429 12/28/22 0918  BP: (!) 117/93 118/67 (!) 118/45 (!) 109/48  Pulse: (!) 58 (!) 57 61 (!) 57  Resp: '18 18 18 18  '$ Temp: 98.1 F (36.7 C) 98.1 F (36.7 C) 98.2 F (36.8 C) 98.4 F (36.9 C)  TempSrc: [place]  SpO2: 96% 92% 97% 97%  Weight:      Height:        Intake/Output Summary (Last 24 hours) at 12/28/2022 1012 Last data filed at 12/28/2022 0655 Gross per 24 hour  Intake 66.65 ml  Output --  Net 66.65 ml   Filed Weights   12/27/22 1649  Weight: 127 kg    Examination:  General exam: Appears calm and comfortable, on room air, communicating well Respiratory system: Clear to auscultation. Respiratory effort normal. Cardiovascular system: S1 & S2 heard, RRR. No JVD, murmurs, rubs, gallops or clicks. No pedal edema. Gastrointestinal system: Abdomen is nondistended, soft and nontender. No organomegaly or masses felt. Normal bowel sounds heard. Central nervous system: Alert and oriented. No focal neurological deficits. Extremities:       Psychiatry: Judgement and insight appear normal. Mood & affect appropriate.    Data Reviewed: I have personally reviewed following labs and imaging studies  CBC: Recent Labs  Lab 12/27/22 1716 12/28/22 0207 12/28/22 0519  WBC 11.0* 11.7* 8.5  NEUTROABS 7.4 7.4  --   HGB 11.6* 12.2 11.0*  HCT 36.2 35.9* 34.0*  MCV 86.8 84.7 85.2  PLT 281 288 800   Basic Metabolic Panel: Recent Labs  Lab 12/27/22 1716 12/28/22 0207 12/28/22 0519  NA 132* 133*  --   K 3.5 3.4*  --   CL 102 102  --   CO2 21* 19*  --   GLUCOSE 108* 115*   --   BUN 18 17  --   CREATININE 0.97 0.94 0.81  CALCIUM 8.8* 9.1  --   MG  --  1.8  --    GFR: Estimated Creatinine Clearance: 87.6 mL/min (by C-G formula based on SCr of 0.81 mg/dL). Liver Function Tests: Recent Labs  Lab 12/27/22 1716  AST 14*  ALT 10  ALKPHOS 66  BILITOT 0.4  PROT 7.0  ALBUMIN 3.3*   No results for input(s): "LIPASE", "AMYLASE" in the last 168 hours. No results for input(s): "AMMONIA" in the last 168 hours. Coagulation Profile: No results for input(s): "INR", "PROTIME" in the last 168 hours. Cardiac Enzymes: No results for input(s): "CKTOTAL", "CKMB", "CKMBINDEX", "TROPONINI" in the last 168 hours. BNP (last 3 results) No results for input(s): "PROBNP" in the last 8760 hours. HbA1C: No results for input(s): "HGBA1C" in the last 72 hours. CBG: No results for input(s): "GLUCAP" in the last 168 hours. Lipid Profile: No results for input(s): "CHOL", "HDL", "LDLCALC", "TRIG", "CHOLHDL", "LDLDIRECT" in the last 72 hours. Thyroid Function Tests: No results for input(s): "TSH", "T4TOTAL", "FREET4", "T3FREE", "THYROIDAB" in the last 72 hours. Anemia Panel: No results for input(s): "VITAMINB12", "FOLATE", "FERRITIN", "TIBC", "IRON", "RETICCTPCT" in the last 72 hours. Sepsis Labs: Recent Labs  Lab 12/28/22 0207 12/28/22 0519  LATICACIDVEN 1.3 0.9    Recent Results (from the past 240 hour(s))  WOUND CULTURE     Status: None (Preliminary result)   Collection Time: 12/26/22 12:04 PM   Specimen: Abscess; Wound  Result Value Ref Range Status   MICRO NUMBER: [id number]  Preliminary   SPECIMEN QUALITY: Adequate  Preliminary   SOURCE: WOUND (SITE NOT SPECIFIED)  Preliminary   STATUS: PRELIMINARY  Preliminary   GRAM STAIN:   Preliminary    No white blood cells seen No epithelial cells seen Moderate Gram positive cocci in pairs Few Gram negative bacilli      Radiology Studies: MR FOOT RIGHT WO CONTRAST  Result Date: 12/28/2022 CLINICAL DATA:  Right foot  infection worst at the second toe. Possible osteomyelitis. EXAM: MRI OF THE RIGHT FOREFOOT WITHOUT CONTRAST TECHNIQUE: Multiplanar, multisequence MR imaging of the right forefoot was performed. No intravenous contrast was administered. COMPARISON:  Plain films right foot 12/26/2022 and plain films right second toe 12/27/2022. FINDINGS: Bones/Joint/Cartilage Intense edema is present throughout the middle and distal phalanges of the second toe. Bony destructive change in the distal phalanx is better seen on the prior plain films. Milder degree of marrow edema is present throughout the proximal phalanx of the second toe. Mild, degenerative edema is seen about the first MTP joint. Imaged bones are otherwise unremarkable. Ligaments Intact. Muscles and Tendons No intramuscular fluid collection or mass. Mild fatty atrophy of intrinsic musculature the foot noted. Soft tissues No abscess is identified. Skin wound is seen at the distal phalanx of the second toe. IMPRESSION: Intense marrow edema in the middle and distal phalanges of the second toe consistent with osteomyelitis. Negative for abscess. Mild marrow  edema throughout the proximal phalanx of the second toe could be reactive or may be due to early osteomyelitis. Mild first MTP osteoarthritis. Electronically Signed   By: Inge Rise M.D.   On: 12/28/2022 09:27   DG Toe 2nd Right  Result Date: 12/27/2022 CLINICAL DATA:  Foot infection possible osteomyelitis EXAM: RIGHT SECOND TOE COMPARISON:  12/26/2022 FINDINGS: Significant soft tissue swelling second toe. Dressing artifacts. Bone destruction identified at distal phalanx with associated bone debris consistent with osteomyelitis. Degenerative changes DIP joint second toe without bone destruction. No fracture, dislocation or additional areas of bone destruction seen. IMPRESSION: Bone destruction involving the tuft of distal phalanx RIGHT second toe consistent with osteomyelitis. Electronically Signed   By: Lavonia Dana M.D.   On: 12/27/2022 18:03    Scheduled Meds:  amphetamine-dextroamphetamine  30 mg Oral Q breakfast   brimonidine  1 drop Both Eyes BID   citalopram  20 mg Oral Daily   enoxaparin (LOVENOX) injection  40 mg Subcutaneous Q24H   fluticasone furoate-vilanterol  1 puff Inhalation Daily   latanoprost  1 drop Both Eyes QHS   levothyroxine  75 mcg Oral Q0600   metoprolol succinate  25 mg Oral Daily   oxybutynin  15 mg Oral Daily   pravastatin  80 mg Oral Daily   Continuous Infusions:  piperacillin-tazobactam (ZOSYN)  IV 12.5 mL/hr at 12/28/22 0655   vancomycin     vancomycin       LOS: 0 days   Time spent: 35 minutes   Barnes Florek Loann Quill, MD [facility]  If 7PM-7AM, please contact night-coverage [email or web address] 12/28/2022, 10:12 AM

## 2022-12-28 NOTE — Consult Note (Signed)
Reason for Consult:osteomyelitis Referring Physician: Dr Kathlene Cote is an 72 y.o. female.  HPI: she is well known to me from the outpatient setting for right foot ulcerations, her visit this week was concerning for osteomyelitis and toe infection. I recommended ER admission she had initially planned to present to [facility] which is closer for her but presented to [facility] ED last night for this.   Past Medical History:  Diagnosis Date   ADD (attention deficit disorder)    COPD (chronic obstructive pulmonary disease) ([place])    Hypertension    Hypothyroidism     Past Surgical History:  Procedure Laterality Date   A-FLUTTER ABLATION N/A 04/08/2018   Procedure: A-FLUTTER ABLATION;  Surgeon: Evans Lance, MD;  Location: [place] [facility];  Service: Cardiovascular;  Laterality: N/A;   ABDOMINAL HYSTERECTOMY     age 53 d/t endometriosis   KNEE ARTHROSCOPY Right    NEUROMAL REMOVAL Right     Family History  Problem Relation Age of Onset   Cancer Other    Diabetes Other    Heart disease Other    Hypertension Other    Obesity Other    Alcoholism Other    Stroke Other    Hypercholesterolemia Other     Social History:  reports that she quit smoking about 9 years ago. Her smoking use included cigarettes. She has never used smokeless tobacco. She reports that she does not currently use alcohol. She reports that she does not currently use drugs.  Allergies:  Allergies  Allergen Reactions   Ketorolac Anaphylaxis, Swelling and Other (See Comments)    Throat swelling  Patient states that she can take this   Prochlorperazine Anaphylaxis, Shortness Of Breath, Swelling and Other (See Comments)    Throat swelling  Patient states that this causes nausea only.   Promethazine Anaphylaxis, Swelling and Other (See Comments)    Throat swells  Patient states that this causes nausea only.   Sulfa Antibiotics Anaphylaxis   Toradol [Ketorolac Tromethamine] Anaphylaxis    Buprenorphine Nausea Only    Patient states that she has no reaction to this.   Nsaids Other (See Comments)    NSAIDS upset the stomach- can tolerate ibuprofen     Medications: I have reviewed the patient's current medications.  Results for orders placed or performed during the hospital encounter of 12/27/22 (from the past 48 hour(s))  Comprehensive metabolic panel     Status: Abnormal   Collection Time: 12/27/22  5:16 PM  Result Value Ref Range   Sodium 132 (L) 135 - 145 mmol/L   Potassium 3.5 3.5 - 5.1 mmol/L   Chloride 102 98 - 111 mmol/L   CO2 21 (L) 22 - 32 mmol/L   Glucose, Bld 108 (H) 70 - 99 mg/dL    Comment: Glucose reference range applies only to samples taken after fasting for at least 8 hours.   BUN 18 8 - 23 mg/dL   Creatinine, Ser 0.97 0.44 - 1.00 mg/dL   Calcium 8.8 (L) 8.9 - 10.3 mg/dL   Total Protein 7.0 6.5 - 8.1 g/dL   Albumin 3.3 (L) 3.5 - 5.0 g/dL   AST 14 (L) 15 - 41 U/L   ALT 10 0 - 44 U/L   Alkaline Phosphatase 66 38 - 126 U/L   Total Bilirubin 0.4 0.3 - 1.2 mg/dL   GFR, Estimated >60 >60 mL/min    Comment: (NOTE) Calculated using the CKD-EPI Creatinine Equation (2021)  Anion gap 9 5 - 15    Comment: Performed at [facility] [address] [place], [place] [address]  CBC with Differential     Status: Abnormal   Collection Time: 12/27/22  5:16 PM  Result Value Ref Range   WBC 11.0 (H) 4.0 - 10.5 K/uL   RBC 4.17 3.87 - 5.11 MIL/uL   Hemoglobin 11.6 (L) 12.0 - 15.0 g/dL   HCT 36.2 36.0 - 46.0 %   MCV 86.8 80.0 - 100.0 fL   MCH 27.8 26.0 - 34.0 pg   MCHC 32.0 30.0 - 36.0 g/dL   RDW 13.7 11.5 - 15.5 %   Platelets 281 150 - 400 K/uL   nRBC 0.0 0.0 - 0.2 %   Neutrophils Relative % 68 %   Neutro Abs 7.4 1.7 - 7.7 K/uL   Lymphocytes Relative 18 %   Lymphs Abs 2.0 0.7 - 4.0 K/uL   Monocytes Relative 12 %   Monocytes Absolute 1.3 (H) 0.1 - 1.0 K/uL   Eosinophils Relative 1 %   Eosinophils Absolute 0.1 0.0 - 0.5 K/uL   Basophils Relative 1  %   Basophils Absolute 0.1 0.0 - 0.1 K/uL   Immature Granulocytes 0 %   Abs Immature Granulocytes 0.04 0.00 - 0.07 K/uL    Comment: Performed at [facility] [address] [place], [place] [address]  Lactic acid, plasma     Status: None   Collection Time: 12/28/22  2:07 AM  Result Value Ref Range   Lactic Acid, Venous 1.3 0.5 - 1.9 mmol/L    Comment: Performed at [facility] [address], [place], [place] [address]  Basic metabolic panel     Status: Abnormal   Collection Time: 12/28/22  2:07 AM  Result Value Ref Range   Sodium 133 (L) 135 - 145 mmol/L   Potassium 3.4 (L) 3.5 - 5.1 mmol/L   Chloride 102 98 - 111 mmol/L   CO2 19 (L) 22 - 32 mmol/L   Glucose, Bld 115 (H) 70 - 99 mg/dL    Comment: Glucose reference range applies only to samples taken after fasting for at least 8 hours.   BUN 17 8 - 23 mg/dL   Creatinine, Ser 0.94 0.44 - 1.00 mg/dL   Calcium 9.1 8.9 - 10.3 mg/dL   GFR, Estimated >60 >60 mL/min    Comment: (NOTE) Calculated using the CKD-EPI Creatinine Equation (2021)    Anion gap 12 5 - 15    Comment: Performed at [facility] [address], [place], [place] [address]  Magnesium     Status: None   Collection Time: 12/28/22  2:07 AM  Result Value Ref Range   Magnesium 1.8 1.7 - 2.4 mg/dL    Comment: Performed at [facility] [address] [place], [place] [address]  CBC with Differential/Platelet     Status: Abnormal   Collection Time: 12/28/22  2:07 AM  Result Value Ref Range   WBC 11.7 (H) 4.0 - 10.5 K/uL   RBC 4.24 3.87 - 5.11 MIL/uL   Hemoglobin 12.2 12.0 - 15.0 g/dL   HCT 35.9 (L) 36.0 - 46.0 %   MCV 84.7 80.0 - 100.0 fL   MCH 28.8 26.0 - 34.0 pg   MCHC 34.0 30.0 - 36.0 g/dL   RDW 13.8 11.5 - 15.5 %   Platelets 288 150 - 400 K/uL   nRBC 0.0 0.0 - 0.2 %   Neutrophils  Relative % 63 %   Neutro Abs 7.4 1.7 - 7.7 K/uL   Lymphocytes Relative 22 %   Lymphs Abs 2.6 0.7 - 4.0 K/uL   Monocytes Relative 12 %   Monocytes  Absolute 1.4 (H) 0.1 - 1.0 K/uL   Eosinophils Relative 2 %   Eosinophils Absolute 0.2 0.0 - 0.5 K/uL   Basophils Relative 1 %   Basophils Absolute 0.1 0.0 - 0.1 K/uL   Immature Granulocytes 0 %   Abs Immature Granulocytes 0.04 0.00 - 0.07 K/uL    Comment: Performed at [facility] [address], [place], [place] [address]  Lactic acid, plasma     Status: None   Collection Time: 12/28/22  5:19 AM  Result Value Ref Range   Lactic Acid, Venous 0.9 0.5 - 1.9 mmol/L    Comment: Performed at [facility] [address] [place], [place] [address]  CBC     Status: Abnormal   Collection Time: 12/28/22  5:19 AM  Result Value Ref Range   WBC 8.5 4.0 - 10.5 K/uL   RBC 3.99 3.87 - 5.11 MIL/uL   Hemoglobin 11.0 (L) 12.0 - 15.0 g/dL   HCT 34.0 (L) 36.0 - 46.0 %   MCV 85.2 80.0 - 100.0 fL   MCH 27.6 26.0 - 34.0 pg   MCHC 32.4 30.0 - 36.0 g/dL   RDW 13.7 11.5 - 15.5 %   Platelets 236 150 - 400 K/uL   nRBC 0.0 0.0 - 0.2 %    Comment: Performed at [facility], [place] [address], [place], [place] [address]  Creatinine, serum     Status: None   Collection Time: 12/28/22  5:19 AM  Result Value Ref Range   Creatinine, Ser 0.81 0.44 - 1.00 mg/dL   GFR, Estimated >60 >60 mL/min    Comment: (NOTE) Calculated using the CKD-EPI Creatinine Equation (2021) Performed at [facility] [address] [place], [place] [address]     MR FOOT RIGHT WO CONTRAST  Result Date: 12/28/2022 CLINICAL DATA:  Right foot infection worst at the second toe. Possible osteomyelitis. EXAM: MRI OF THE RIGHT FOREFOOT WITHOUT CONTRAST TECHNIQUE: Multiplanar, multisequence MR imaging of the right forefoot was performed. No intravenous contrast was administered. COMPARISON:  Plain films right foot 12/26/2022 and plain films right second toe 12/27/2022. FINDINGS: Bones/Joint/Cartilage Intense edema is present throughout the middle and distal phalanges of the second toe. Bony destructive change in the  distal phalanx is better seen on the prior plain films. Milder degree of marrow edema is present throughout the proximal phalanx of the second toe. Mild, degenerative edema is seen about the first MTP joint. Imaged bones are otherwise unremarkable. Ligaments Intact. Muscles and Tendons No intramuscular fluid collection or mass. Mild fatty atrophy of intrinsic musculature the foot noted. Soft tissues No abscess is identified. Skin wound is seen at the distal phalanx of the second toe. IMPRESSION: Intense marrow edema in the middle and distal phalanges of the second toe consistent with osteomyelitis. Negative for abscess. Mild marrow edema throughout the proximal phalanx of the second toe could be reactive or may be due to early osteomyelitis. Mild first MTP osteoarthritis. Electronically Signed   By: Inge Rise M.D.   On: 12/28/2022 09:27   DG Toe 2nd Right  Result Date: 12/27/2022 CLINICAL DATA:  Foot infection possible osteomyelitis EXAM: RIGHT SECOND TOE COMPARISON:  12/26/2022 FINDINGS: Significant soft tissue swelling second toe. Dressing artifacts. Bone destruction identified  at distal phalanx with associated bone debris consistent with osteomyelitis. Degenerative changes DIP joint second toe without bone destruction. No fracture, dislocation or additional areas of bone destruction seen. IMPRESSION: Bone destruction involving the tuft of distal phalanx RIGHT second toe consistent with osteomyelitis. Electronically Signed   By: Lavonia Dana M.D.   On: 12/27/2022 18:03    Review of Systems  Constitutional:  Negative for chills, fever and malaise/fatigue.  Skin:        Ulcer of toe  All other systems reviewed and are negative.  Blood pressure (!) 100/54, pulse (!) 59, temperature 98.6 F (37 C), temperature source Oral, resp. rate 18, height 5' 6.5" (1.689 m), weight 127 kg, SpO2 98 %.  Vitals:   12/28/22 0918 12/28/22 1644  BP: (!) 109/48 (!) 100/54  Pulse: (!) 57 (!) 59  Resp: 18 18   Temp: 98.4 F (36.9 C) 98.6 F (37 C)  SpO2: 97% 98%    General AA&O x3. Normal mood and affect.  Vascular Dorsalis pedis and posterior tibial pulses  present 2+ right  Capillary refill normal to all digits. Pedal hair growth normal.  Neurologic Epicritic sensation grossly absent.  Dermatologic (Wound) Full thickness ulcer with purulent drainage right 2nd toe celluliits to MPJ improved since office eval  Orthopedic: Motor intact BLE.    Assessment/Plan:  Osteomyelitis 2nd toe R -Imaging: Studies independently reviewed -Antibiotics: on vancomycin and zosyn. Office culture in progress -WB Status: WBAT -MRI reviewed with patient. We discussed treatment options. I recommended partial vs full toe amputation which she understands. She is agreeable to surgery -NPO past midnight -Surgical Plan: OR tomorrow R 2nd toe amputation  Autumn Parrish 12/28/2022, 5:16 PM   Best available via secure chat for questions or concerns.

## 2022-12-29 ENCOUNTER — Encounter (HOSPITAL_COMMUNITY): Payer: Self-pay | Admitting: Family Medicine

## 2022-12-29 ENCOUNTER — Other Ambulatory Visit: Payer: Self-pay

## 2022-12-29 ENCOUNTER — Inpatient Hospital Stay (HOSPITAL_COMMUNITY): Payer: Medicare (Managed Care) | Admitting: Anesthesiology

## 2022-12-29 ENCOUNTER — Encounter (HOSPITAL_COMMUNITY)
Admission: EM | Disposition: A | Discharge status: Home Health | Payer: Self-pay | Source: Home / Self Care | Attending: Internal Medicine

## 2022-12-29 DIAGNOSIS — M86171 Other acute osteomyelitis, right ankle and foot: Secondary | ICD-10-CM | POA: Diagnosis not present

## 2022-12-29 DIAGNOSIS — I4891 Unspecified atrial fibrillation: Secondary | ICD-10-CM | POA: Diagnosis not present

## 2022-12-29 DIAGNOSIS — I10 Essential (primary) hypertension: Secondary | ICD-10-CM | POA: Diagnosis not present

## 2022-12-29 DIAGNOSIS — J449 Chronic obstructive pulmonary disease, unspecified: Secondary | ICD-10-CM | POA: Diagnosis not present

## 2022-12-29 DIAGNOSIS — M869 Osteomyelitis, unspecified: Secondary | ICD-10-CM

## 2022-12-29 DIAGNOSIS — E039 Hypothyroidism, unspecified: Secondary | ICD-10-CM

## 2022-12-29 HISTORY — PX: AMPUTATION TOE: SHX6595

## 2022-12-29 LAB — CBC
HCT: 34.5 % — ABNORMAL LOW (ref 36.0–46.0)
Hemoglobin: 11 g/dL — ABNORMAL LOW (ref 12.0–15.0)
MCH: 27.8 pg (ref 26.0–34.0)
MCHC: 31.9 g/dL (ref 30.0–36.0)
MCV: 87.3 fL (ref 80.0–100.0)
Platelets: 243 10*3/uL (ref 150–400)
RBC: 3.95 MIL/uL (ref 3.87–5.11)
RDW: 13.7 % (ref 11.5–15.5)
WBC: 7.1 10*3/uL (ref 4.0–10.5)
nRBC: 0 % (ref 0.0–0.2)

## 2022-12-29 LAB — WOUND CULTURE
MICRO NUMBER:: 14443796
SPECIMEN QUALITY:: ADEQUATE

## 2022-12-29 LAB — BASIC METABOLIC PANEL
Anion gap: 12 (ref 5–15)
BUN: 10 mg/dL (ref 8–23)
CO2: 17 mmol/L — ABNORMAL LOW (ref 22–32)
Calcium: 8.5 mg/dL — ABNORMAL LOW (ref 8.9–10.3)
Chloride: 108 mmol/L (ref 98–111)
Creatinine, Ser: 0.73 mg/dL (ref 0.44–1.00)
GFR, Estimated: >60 mL/min (ref >60)
Glucose, Bld: 110 mg/dL — ABNORMAL HIGH (ref 70–99)
Potassium: 3.7 mmol/L (ref 3.5–5.1)
Sodium: 137 mmol/L (ref 135–145)

## 2022-12-29 LAB — HEMOGLOBIN A1C
Hgb A1c MFr Bld: 5.9 % — ABNORMAL HIGH (ref 4.8–5.6)
Mean Plasma Glucose: 122.63 mg/dL

## 2022-12-29 SURGERY — AMPUTATION, TOE
Anesthesia: Monitor Anesthesia Care | Site: Toe | Laterality: Right

## 2022-12-29 MED ORDER — PROPOFOL 500 MG/50ML IV EMUL
INTRAVENOUS | Status: DC | PRN
  Administered 2022-12-29: 50 ug/kg/min via INTRAVENOUS

## 2022-12-29 MED ORDER — FENTANYL CITRATE (PF) 250 MCG/5ML IJ SOLN
INTRAMUSCULAR | Status: DC | PRN
  Administered 2022-12-29: 50 ug via INTRAVENOUS

## 2022-12-29 MED ORDER — BUPIVACAINE HCL (PF) 0.5 % IJ SOLN
INTRAMUSCULAR | Status: AC
  Filled 2022-12-29: qty 30

## 2022-12-29 MED ORDER — ACETAMINOPHEN 500 MG PO TABS: 1000.0000 mg | ORAL_TABLET | Freq: Once | ORAL | Status: AC

## 2022-12-29 MED ORDER — ONDANSETRON HCL 4 MG/2ML IJ SOLN: 4.0000 mg | Freq: Once | INTRAMUSCULAR | Status: DC | PRN

## 2022-12-29 MED ORDER — FENTANYL CITRATE (PF) 100 MCG/2ML IJ SOLN
INTRAMUSCULAR | Status: AC
  Filled 2022-12-29: qty 2

## 2022-12-29 MED ORDER — SODIUM CHLORIDE 0.9 % IV SOLN: INTRAVENOUS | Status: DC

## 2022-12-29 MED ORDER — LACTATED RINGERS IV SOLN: INTRAVENOUS | Status: DC

## 2022-12-29 MED ORDER — VANCOMYCIN HCL 500 MG IV SOLR
INTRAVENOUS | Status: AC
  Filled 2022-12-29: qty 10

## 2022-12-29 MED ORDER — 0.9 % SODIUM CHLORIDE (POUR BTL) OPTIME
TOPICAL | Status: DC | PRN
  Administered 2022-12-29: 1000 mL

## 2022-12-29 MED ORDER — VANCOMYCIN HCL 500 MG IV SOLR
INTRAVENOUS | Status: DC | PRN
  Administered 2022-12-29: 500 mg via TOPICAL

## 2022-12-29 MED ORDER — ACETAMINOPHEN 500 MG PO TABS
ORAL_TABLET | ORAL | Status: AC
  Administered 2022-12-29: 1000 mg via ORAL
  Filled 2022-12-29: qty 2

## 2022-12-29 MED ORDER — PROPOFOL 10 MG/ML IV BOLUS
INTRAVENOUS | Status: DC | PRN
  Administered 2022-12-29: 10 mg via INTRAVENOUS

## 2022-12-29 MED ORDER — OXYCODONE HCL 5 MG/5ML PO SOLN: 5.0000 mg | Freq: Once | ORAL | Status: DC | PRN

## 2022-12-29 MED ORDER — OXYCODONE HCL 5 MG PO TABS: 5.0000 mg | ORAL_TABLET | Freq: Once | ORAL | Status: DC | PRN

## 2022-12-29 MED ORDER — FENTANYL CITRATE (PF) 100 MCG/2ML IJ SOLN
25.0000 ug | INTRAMUSCULAR | Status: DC | PRN
  Administered 2022-12-29: 50 ug via INTRAVENOUS

## 2022-12-29 MED ORDER — ORAL CARE MOUTH RINSE: 15.0000 mL | OROMUCOSAL | Status: DC | PRN

## 2022-12-29 MED ORDER — CHLORHEXIDINE GLUCONATE 0.12 % MT SOLN
OROMUCOSAL | Status: AC
  Administered 2022-12-29: 15 mL
  Filled 2022-12-29: qty 15

## 2022-12-29 MED ORDER — CHLORHEXIDINE GLUCONATE 0.12 % MT SOLN: 15.0000 mL | Freq: Once | OROMUCOSAL | Status: DC

## 2022-12-29 MED ORDER — LIDOCAINE 2% (20 MG/ML) 5 ML SYRINGE
INTRAMUSCULAR | Status: DC | PRN
  Administered 2022-12-29: 40 mg via INTRAVENOUS

## 2022-12-29 MED ORDER — FENTANYL CITRATE (PF) 250 MCG/5ML IJ SOLN
INTRAMUSCULAR | Status: AC
  Filled 2022-12-29: qty 5

## 2022-12-29 MED ORDER — LACTATED RINGERS IV SOLN: INTRAVENOUS | Status: DC | PRN

## 2022-12-29 MED ORDER — ORAL CARE MOUTH RINSE: 15.0000 mL | Freq: Once | OROMUCOSAL | Status: DC

## 2022-12-29 MED ORDER — BUPIVACAINE HCL (PF) 0.5 % IJ SOLN
INTRAMUSCULAR | Status: DC | PRN
  Administered 2022-12-29: 10 mL

## 2022-12-29 MED ORDER — LIDOCAINE HCL 2 % IJ SOLN
INTRAMUSCULAR | Status: AC
  Filled 2022-12-29: qty 20

## 2022-12-29 SURGICAL SUPPLY — 25 items
BAG COUNTER SPONGE SURGICOUNT (BAG) ×2 IMPLANT
BAG SPNG CNTER NS LX DISP (BAG) ×1
BNDG ELASTIC 4X5.8 VLCR STR LF (GAUZE/BANDAGES/DRESSINGS) IMPLANT
BNDG GAUZE DERMACEA FLUFF 4 (GAUZE/BANDAGES/DRESSINGS) IMPLANT
BNDG GZE DERMACEA 4 6PLY (GAUZE/BANDAGES/DRESSINGS) ×1
COVER SURGICAL LIGHT HANDLE (MISCELLANEOUS) ×2 IMPLANT
DRSG XEROFORM 1X8 (GAUZE/BANDAGES/DRESSINGS) IMPLANT
ELECT REM PT RETURN 9FT ADLT (ELECTROSURGICAL) ×1
ELECTRODE REM PT RTRN 9FT ADLT (ELECTROSURGICAL) IMPLANT
GAUZE SPONGE 4X4 12PLY STRL (GAUZE/BANDAGES/DRESSINGS) IMPLANT
GLOVE BIOGEL PI IND STRL 7.5 (GLOVE) IMPLANT
GLOVE ECLIPSE 7.0 STRL STRAW (GLOVE) IMPLANT
GOWN STRL REUS W/ TWL LRG LVL3 (GOWN DISPOSABLE) ×4 IMPLANT
GOWN STRL REUS W/TWL LRG LVL3 (GOWN DISPOSABLE) ×2
KIT BASIN OR (CUSTOM PROCEDURE TRAY) ×2 IMPLANT
KIT TURNOVER KIT B (KITS) ×2 IMPLANT
NS IRRIG 1000ML POUR BTL (IV SOLUTION) ×2 IMPLANT
PACK ORTHO EXTREMITY (CUSTOM PROCEDURE TRAY) ×2 IMPLANT
PAD ARMBOARD 7.5X6 YLW CONV (MISCELLANEOUS) ×4 IMPLANT
SUT ETHILON 3 0 PS 1 (SUTURE) IMPLANT
SUT MNCRL AB 3-0 PS2 27 (SUTURE) IMPLANT
SYR CONTROL 10ML LL (SYRINGE) ×2 IMPLANT
TOWEL GREEN STERILE (TOWEL DISPOSABLE) ×2 IMPLANT
TUBE CONNECTING 12X1/4 (SUCTIONS) ×2 IMPLANT
YANKAUER SUCT BULB TIP NO VENT (SUCTIONS) IMPLANT

## 2022-12-29 NOTE — Anesthesia Procedure Notes (Signed)
Procedure Name: MAC Date/Time: 12/29/2022 8:43 AM  Performed by: Mariea Clonts, CRNAPre-anesthesia Checklist: Patient identified, Emergency Drugs available, Suction available, Patient being monitored and Timeout performed Patient Re-evaluated:Patient Re-evaluated prior to induction Oxygen Delivery Method: Simple face mask

## 2022-12-29 NOTE — Anesthesia Postprocedure Evaluation (Signed)
Anesthesia Post Note  Patient: Autumn Parrish  Procedure(s) Performed: AMPUTATION TOE (Right: Toe)     Patient location during evaluation: PACU Anesthesia Type: MAC Level of consciousness: awake and alert Pain management: pain level controlled Vital Signs Assessment: post-procedure vital signs reviewed and stable Respiratory status: spontaneous breathing, nonlabored ventilation and respiratory function stable Cardiovascular status: stable and blood pressure returned to baseline Anesthetic complications: no   No notable events documented.  Last Vitals:  Vitals:   12/29/22 0955 12/29/22 1018  BP: (!) 124/48 (!) 125/52  Pulse: (!) 55 (!) 58  Resp: 12 20  Temp: 36.8 C (!) 36.3 C  SpO2: 97% 97%    Last Pain:  Vitals:   12/29/22 1018  TempSrc:   PainSc: [place]

## 2022-12-29 NOTE — Progress Notes (Signed)
PROGRESS NOTE    Autumn Parrish  ZOX:[id number] DOB: 09/30/1951 DOA: 12/27/2022 PCP: [facility]   Brief Narrative:  This is a 72 year old female with past medical history of hypertension, hypothyroidism, COPD, ADHD, morbid obesity.  Per patient approximately week and a half ago her right great second toe started getting swollen and painful.  She is up with podiatry for the first time on the 18th, presented to the ER for evaluation of osteomyelitis, treatment and possible amputation.  She has no history of trauma or prior infections.   X-ray is positive for bone destruction/osteomyelitis of the tuft of the distal right phalanx.  Podiatry consulted.  Patient admitted for further evaluation and management of osteomyelitis of right distal phalanx.  Assessment & Plan:   Right second toe osteomyelitis: -S/p right second toe amputation on 1/21. -Continue to monitor H&H closely.  Continue as needed pain medications.  She remained afebrile with no leukocytosis.  Currently on Vanco and Zosyn -Appreciated podiatry Dr. Sherryle Lis consultation.  Atrial flutter: -Status post ablation in 2019.  Metoprolol Held due to bradycardia.  Depression: Continue Celexa  ADHD: Continue Adderall  Hypothyroidism: Continue Synthyroid  Hyperlipidemia: Continue statins  History of glaucoma continue home eardrops  COPD: On room air.  No wheezing noted on exam.  Stable.:  On room air.  No wheezing noted on exam.  Stable.  Continue home inhalers  Morbid obesity with BMI of 44: Diet modification, exercise and weight loss recommended.  Hyponatremia: Resolved  Non-anion gap metabolic acidosis: -Likely due to dehydration.  Bicarb 17.  Start gentle IV fluids  Hypokalemia: Replenished.  Magnesium level: WNL.   DVT prophylaxis: Lovenox Code Status: Full code Family Communication:  None present at bedside.  Plan of care discussed with patient in length and she verbalized  understanding and agreed with it. Disposition Plan: To be determined  Consultants:  Podiatry  Procedures:  None  Antimicrobials:  Vancomycin Zosyn  Status is: Inpatient   Subjective: Patient seen and examined.  Lying comfortably on the bed.  Status post right second toe amputation.  Denies any pain at this time.  Reports that she is hungry and would like to eat something.  Objective: Vitals:   12/29/22 0930 12/29/22 0945 12/29/22 0955 12/29/22 1018  BP: (!) 112/57 121/60 (!) 124/48 (!) 125/52  Pulse: (!) 56 (!) 54 (!) 55 (!) 58  Resp: '16 14 12 20  '$ Temp: (!) 97.2 F (36.2 C)  98.3 F (36.8 C) (!) 97.4 F (36.3 C)  TempSrc:      SpO2: 97% 90% 97% 97%  Weight:      Height:        Intake/Output Summary (Last 24 hours) at 12/29/2022 1113 Last data filed at 12/29/2022 8119 Gross per 24 hour  Intake 2176.14 ml  Output 250 ml  Net 1926.14 ml    Filed Weights   12/27/22 1649 12/28/22 2009  Weight: 127 kg 113.9 kg    Examination:  General exam: Appears calm and comfortable, on room air, communicating well Respiratory system: Clear to auscultation. Respiratory effort normal. Cardiovascular system: S1 & S2 heard, RRR. No JVD, murmurs, rubs, gallops or clicks. No pedal edema. Gastrointestinal system: Abdomen is nondistended, soft and nontender. No organomegaly or masses felt. Normal bowel sounds heard. Central nervous system: Alert and oriented. No focal neurological deficits. Extremities: Right foot: Dressing dry and intact. Psychiatry: Judgement and insight appear normal. Mood & affect appropriate.    Data Reviewed: I have personally  reviewed following labs and imaging studies  CBC: Recent Labs  Lab 12/27/22 1716 12/28/22 0207 12/28/22 0519  WBC 11.0* 11.7* 8.5  NEUTROABS 7.4 7.4  --   HGB 11.6* 12.2 11.0*  HCT 36.2 35.9* 34.0*  MCV 86.8 84.7 85.2  PLT 281 288 528    Basic Metabolic Panel: Recent Labs  Lab 12/27/22 1716 12/28/22 0207 12/28/22 0519   NA 132* 133*  --   K 3.5 3.4*  --   CL 102 102  --   CO2 21* 19*  --   GLUCOSE 108* 115*  --   BUN 18 17  --   CREATININE 0.97 0.94 0.81  CALCIUM 8.8* 9.1  --   MG  --  1.8  --     GFR: Estimated Creatinine Clearance: 81.6 mL/min (by C-G formula based on SCr of 0.81 mg/dL). Liver Function Tests: Recent Labs  Lab 12/27/22 1716  AST 14*  ALT 10  ALKPHOS 66  BILITOT 0.4  PROT 7.0  ALBUMIN 3.3*    No results for input(s): "LIPASE", "AMYLASE" in the last 168 hours. No results for input(s): "AMMONIA" in the last 168 hours. Coagulation Profile: No results for input(s): "INR", "PROTIME" in the last 168 hours. Cardiac Enzymes: No results for input(s): "CKTOTAL", "CKMB", "CKMBINDEX", "TROPONINI" in the last 168 hours. BNP (last 3 results) No results for input(s): "PROBNP" in the last 8760 hours. HbA1C: No results for input(s): "HGBA1C" in the last 72 hours. CBG: No results for input(s): "GLUCAP" in the last 168 hours. Lipid Profile: No results for input(s): "CHOL", "HDL", "LDLCALC", "TRIG", "CHOLHDL", "LDLDIRECT" in the last 72 hours. Thyroid Function Tests: No results for input(s): "TSH", "T4TOTAL", "FREET4", "T3FREE", "THYROIDAB" in the last 72 hours. Anemia Panel: No results for input(s): "VITAMINB12", "FOLATE", "FERRITIN", "TIBC", "IRON", "RETICCTPCT" in the last 72 hours. Sepsis Labs: Recent Labs  Lab 12/28/22 0207 12/28/22 0519  LATICACIDVEN 1.3 0.9     Recent Results (from the past 240 hour(s))  WOUND CULTURE     Status: None   Collection Time: 12/26/22 12:04 PM   Specimen: Abscess; Wound  Result Value Ref Range Status   MICRO NUMBER: [id number]  Final   SPECIMEN QUALITY: Adequate  Final   SOURCE: WOUND (SITE NOT SPECIFIED)  Final   STATUS: FINAL  Final   GRAM STAIN:   Final    No white blood cells seen No epithelial cells seen Moderate Gram positive cocci in pairs Few Gram negative bacilli   RESULT:   Final    A mix of organisms of questionable  significance was recovered on culture and not further identified. (Note: Growth did not detect the presence of S.aureus, beta-hemolytic Streptococci or P.aeruginosa).  Surgical pcr screen     Status: None   Collection Time: 12/28/22  6:08 PM   Specimen: Nasal Mucosa; Nasal Swab  Result Value Ref Range Status   MRSA, PCR NEGATIVE NEGATIVE Final   Staphylococcus aureus NEGATIVE NEGATIVE Final    Comment: (NOTE) The Xpert SA Assay (FDA approved for NASAL specimens in patients 90 years of age and older), is one component of a comprehensive surveillance program. It is not intended to diagnose infection nor to guide or monitor treatment. Performed at [facility], [place] [address], [place], [place] [address]       Radiology Studies: MR FOOT RIGHT WO CONTRAST  Result Date: 12/28/2022 CLINICAL DATA:  Right foot infection worst at the second toe. Possible osteomyelitis. EXAM: MRI OF THE RIGHT FOREFOOT WITHOUT  CONTRAST TECHNIQUE: Multiplanar, multisequence MR imaging of the right forefoot was performed. No intravenous contrast was administered. COMPARISON:  Plain films right foot 12/26/2022 and plain films right second toe 12/27/2022. FINDINGS: Bones/Joint/Cartilage Intense edema is present throughout the middle and distal phalanges of the second toe. Bony destructive change in the distal phalanx is better seen on the prior plain films. Milder degree of marrow edema is present throughout the proximal phalanx of the second toe. Mild, degenerative edema is seen about the first MTP joint. Imaged bones are otherwise unremarkable. Ligaments Intact. Muscles and Tendons No intramuscular fluid collection or mass. Mild fatty atrophy of intrinsic musculature the foot noted. Soft tissues No abscess is identified. Skin wound is seen at the distal phalanx of the second toe. IMPRESSION: Intense marrow edema in the middle and distal phalanges of the second toe consistent with osteomyelitis. Negative for abscess.  Mild marrow edema throughout the proximal phalanx of the second toe could be reactive or may be due to early osteomyelitis. Mild first MTP osteoarthritis. Electronically Signed   By: Inge Rise M.D.   On: 12/28/2022 09:27   DG Toe 2nd Right  Result Date: 12/27/2022 CLINICAL DATA:  Foot infection possible osteomyelitis EXAM: RIGHT SECOND TOE COMPARISON:  12/26/2022 FINDINGS: Significant soft tissue swelling second toe. Dressing artifacts. Bone destruction identified at distal phalanx with associated bone debris consistent with osteomyelitis. Degenerative changes DIP joint second toe without bone destruction. No fracture, dislocation or additional areas of bone destruction seen. IMPRESSION: Bone destruction involving the tuft of distal phalanx RIGHT second toe consistent with osteomyelitis. Electronically Signed   By: Lavonia Dana M.D.   On: 12/27/2022 18:03    Scheduled Meds:  amphetamine-dextroamphetamine  30 mg Oral Q breakfast   brimonidine  1 drop Both Eyes BID   citalopram  20 mg Oral Daily   enoxaparin (LOVENOX) injection  40 mg Subcutaneous Q24H   fentaNYL       fluticasone furoate-vilanterol  1 puff Inhalation Daily   latanoprost  1 drop Both Eyes QHS   levothyroxine  75 mcg Oral Q0600   metoprolol succinate  25 mg Oral Daily   oxybutynin  15 mg Oral Daily   pravastatin  80 mg Oral Daily   Continuous Infusions:  piperacillin-tazobactam (ZOSYN)  IV 3.375 g (12/29/22 0749)   vancomycin 750 mg (12/29/22 0228)     LOS: 1 day   Time spent: 35 minutes   Mellisa Arshad Loann Quill, MD [facility]  If 7PM-7AM, please contact night-coverage [email or web address] 12/29/2022, 11:13 AM

## 2022-12-29 NOTE — Progress Notes (Signed)
Orthopedic Tech Progress Note Patient Details:  Autumn Parrish [birth year] [id number]  Ortho Devices Type of Ortho Device: Postop shoe/boot Ortho Device/Splint Location: RLE Ortho Device/Splint Interventions: Ordered, Adjustment, Application   Post Interventions Patient Tolerated: Well Instructions Provided: Care of device, Adjustment of device  [name] Jeri Modena 12/29/2022, 12:21 PM

## 2022-12-29 NOTE — Brief Op Note (Signed)
12/29/2022  9:32 AM  PATIENT:  Autumn Parrish  72 y.o. female  PRE-OPERATIVE DIAGNOSIS:  OSTEOMYELITIS RIGHT SECOND TOE  POST-OPERATIVE DIAGNOSIS:  OSTEOMYELITIS RIGHT SECOND TOE  PROCEDURE:  Procedure(s): AMPUTATION TOE (Right)  SURGEON:  Surgeon(s) and Role:    * Domanique Huesman, Stephan Minister, DPM - Primary  PHYSICIAN ASSISTANT:   ASSISTANTS: none   ANESTHESIA:   local and MAC  EBL:  20cc   BLOOD ADMINISTERED:none  DRAINS: none   LOCAL MEDICATIONS USED:  MARCAINE    and Amount: 10 ml  SPECIMEN: Right second toe  DISPOSITION OF SPECIMEN: Tissue culture microbiology and pathology for remainder of toe  COUNTS:  YES  TOURNIQUET:  * No tourniquets in log *  DICTATION: .Note written in EPIC  PLAN OF CARE: Admit to inpatient   PATIENT DISPOSITION:  PACU - hemodynamically stable.   Delay start of Pharmacological VTE agent (>24hrs) due to surgical blood loss or risk of bleeding: no

## 2022-12-29 NOTE — Transfer of Care (Signed)
Immediate Anesthesia Transfer of Care Note  Patient: Autumn Parrish  Procedure(s) Performed: AMPUTATION TOE (Right: Toe)  Patient Location: PACU  Anesthesia Type:MAC  Level of Consciousness: awake, alert , and oriented  Airway & Oxygen Therapy: Patient Spontanous Breathing  Post-op Assessment: Report given to RN and Post -op Vital signs reviewed and stable  Post vital signs: Reviewed and stable  Last Vitals:  Vitals Value Taken Time  BP 112/57 12/29/22 0930  Temp    Pulse 55 12/29/22 0931  Resp 14 12/29/22 0931  SpO2 95 % 12/29/22 0931  Vitals shown include unvalidated device data.  Last Pain:  Vitals:   12/29/22 0734  TempSrc:   PainSc: 6       Patients Stated Pain Goal: 0 (72/53/66 4403)  Complications: No notable events documented.

## 2022-12-29 NOTE — Op Note (Signed)
Patient Name: Autumn Parrish DOB: 21-Oct-1951  MRN: [id number]   Date of Service: 12/27/2022 - 12/29/2022  Surgeon: Dr. Lanae Crumbly, DPM Assistants: None Pre-operative Diagnosis:  Osteomyelitis right second toe Post-operative Diagnosis:  Osteomyelitis right second toe Procedures:  1) amputation partial right second toe Pathology/Specimens: ID Type Source Tests Collected by Time Destination  1 : Right Second Toe Amputation Toe, Right SURGICAL PATHOLOGY Criselda Peaches, DPM 12/29/2022 6226   A : Right Second Toe Tissue Soft Tissue, Other AEROBIC/ANAEROBIC CULTURE W GRAM STAIN (SURGICAL/DEEP WOUND) Criselda Peaches, DPM 12/29/2022 0914    Anesthesia: MAC with local Hemostasis: * No tourniquets in log * Estimated Blood Loss: 10 cc Materials: * No implants in log * Medications: 10 mL 0.5% Marcaine plain, 500 mg vancomycin powder Complications: No complication noted  Indications for Procedure:  This is a 72 y.o. female with a history of peripheral neuropathy and ulceration at the distal second toe.  Preoperative MRI showed osteomyelitis of the distal and middle phalanx.  Operative intervention was recommended. All risks, benefits and potential complications discussed prior to the procedure. All questions addressed. Informed consent signed and reviewed.      Procedure in Detail: Patient was identified in pre-operative holding area. Formal consent was signed and the right lower extremity was marked. Patient was brought back to the operating room. Anesthesia was induced. The extremity was prepped and draped in the usual sterile fashion. Timeout was taken to confirm patient name, laterality, and procedure prior to incision.   Attention was then directed to the right second toe where an incision was made. Dissection was carried down to level of bone.  Dissection was continued to the mid diaphysis of the proximal phalanx.  The bone and soft tissue attachments of the proximal phalanx were removed  and passed for pathology.  The remaining phalanx appeared healthy and viable.  The area was copiously irrigated.  The skin was reapproximated with nylon and Monocryl.  The foot was then dressed with Xeroform and dry sterile dressings. Patient tolerated the procedure well.   Disposition: Following a period of post-operative monitoring, patient will be transferred to the floor.  Expected surgical cure of the osteomyelitis.  She will be WBAT in a surgical shoe.  Should be able to discharge on doxycycline 100 mg twice daily for 14 days for residual cellulitis.

## 2022-12-29 NOTE — Progress Notes (Signed)
History and Physical Interval Note:  12/29/2022 7:42 AM  Autumn Parrish  has presented today for surgery, with the diagnosis of osteomyelitis right second toe.  The various methods of treatment have been discussed with the patient and family. After consideration of risks, benefits and other options for treatment, the patient has consented to   Procedure(s): AMPUTATION TOE (Right) as a surgical intervention.  The patient's history has been reviewed, patient examined, no change in status, stable for surgery.  I have reviewed the patient's chart and labs.  Questions were answered to the patient's satisfaction.     Criselda Peaches

## 2022-12-30 ENCOUNTER — Other Ambulatory Visit (HOSPITAL_COMMUNITY): Payer: Self-pay

## 2022-12-30 ENCOUNTER — Encounter (HOSPITAL_COMMUNITY): Payer: Self-pay | Admitting: Podiatry

## 2022-12-30 DIAGNOSIS — M869 Osteomyelitis, unspecified: Secondary | ICD-10-CM | POA: Diagnosis not present

## 2022-12-30 LAB — CBC
HCT: 35.8 % — ABNORMAL LOW (ref 36.0–46.0)
Hemoglobin: 11.3 g/dL — ABNORMAL LOW (ref 12.0–15.0)
MCH: 27.8 pg (ref 26.0–34.0)
MCHC: 31.6 g/dL (ref 30.0–36.0)
MCV: 88 fL (ref 80.0–100.0)
Platelets: 259 K/uL (ref 150–400)
RBC: 4.07 MIL/uL (ref 3.87–5.11)
RDW: 13.7 % (ref 11.5–15.5)
WBC: 7.8 K/uL (ref 4.0–10.5)
nRBC: 0 % (ref 0.0–0.2)

## 2022-12-30 LAB — BASIC METABOLIC PANEL WITH GFR
Anion gap: 6 (ref 5–15)
BUN: 10 mg/dL (ref 8–23)
CO2: 23 mmol/L (ref 22–32)
Calcium: 8.3 mg/dL — ABNORMAL LOW (ref 8.9–10.3)
Chloride: 107 mmol/L (ref 98–111)
Creatinine, Ser: 0.82 mg/dL (ref 0.44–1.00)
GFR, Estimated: >60 mL/min
Glucose, Bld: 119 mg/dL — ABNORMAL HIGH (ref 70–99)
Potassium: 3.5 mmol/L (ref 3.5–5.1)
Sodium: 136 mmol/L (ref 135–145)

## 2022-12-30 MED ORDER — HYDROCODONE-ACETAMINOPHEN 5-325 MG PO TABS: 1.0000 | ORAL_TABLET | Freq: Four times a day (QID) | ORAL | 0 refills | Status: AC | PRN

## 2022-12-30 MED ORDER — DOXYCYCLINE HYCLATE 100 MG PO TABS
100.0000 mg | ORAL_TABLET | Freq: Two times a day (BID) | ORAL | 0 refills | Status: DC
  Filled 2022-12-30: qty 28, 14d supply, fill #0

## 2022-12-30 MED ORDER — DOXYCYCLINE HYCLATE 100 MG PO TABS: 100.0000 mg | ORAL_TABLET | Freq: Two times a day (BID) | ORAL | 0 refills | Status: AC

## 2022-12-30 MED ORDER — HYDROCODONE-ACETAMINOPHEN 5-325 MG PO TABS
1.0000 | ORAL_TABLET | Freq: Four times a day (QID) | ORAL | 0 refills | Status: DC | PRN
  Filled 2022-12-30: qty 30, 4d supply, fill #0

## 2022-12-30 MED ORDER — POTASSIUM CHLORIDE CRYS ER 20 MEQ PO TBCR
40.0000 meq | EXTENDED_RELEASE_TABLET | Freq: Once | ORAL | Status: AC
  Administered 2022-12-30: 40 meq via ORAL
  Filled 2022-12-30: qty 2

## 2022-12-30 MED ORDER — OXYBUTYNIN CHLORIDE ER 15 MG PO TB24: 15.0000 mg | ORAL_TABLET | Freq: Every day | ORAL | Status: AC

## 2022-12-30 NOTE — Discharge Instructions (Signed)
May be weightbearing as tolerated with surgical shoe.  Podiatry recommends continued antibiotics with doxycycline 100 mg twice daily for 14 days after discharge.  Please ensure close follow-up with your podiatrist.

## 2022-12-30 NOTE — Plan of Care (Signed)
  Problem: Health Behavior/Discharge Planning: Goal: Ability to manage health-related needs will improve 12/30/2022 1250 by Hassell Halim, LPN Outcome: Adequate for Discharge 12/30/2022 1250 by Hassell Halim, LPN Outcome: Adequate for Discharge   Problem: Clinical Measurements: Goal: Ability to maintain clinical measurements within normal limits will improve 12/30/2022 1250 by Hassell Halim, LPN Outcome: Adequate for Discharge 12/30/2022 1250 by Hassell Halim, LPN Outcome: Adequate for Discharge Goal: Will remain free from infection 12/30/2022 1250 by Hassell Halim, LPN Outcome: Adequate for Discharge 12/30/2022 1250 by Hassell Halim, LPN Outcome: Adequate for Discharge Goal: Diagnostic test results will improve 12/30/2022 1250 by Hassell Halim, LPN Outcome: Adequate for Discharge 12/30/2022 1250 by Hassell Halim, LPN Outcome: Adequate for Discharge Goal: Respiratory complications will improve 12/30/2022 1250 by Hassell Halim, LPN Outcome: Adequate for Discharge 12/30/2022 1250 by Hassell Halim, LPN Outcome: Adequate for Discharge Goal: Cardiovascular complication will be avoided 12/30/2022 1250 by Hassell Halim, LPN Outcome: Adequate for Discharge 12/30/2022 1250 by Hassell Halim, LPN Outcome: Adequate for Discharge   Problem: Activity: Goal: Risk for activity intolerance will decrease 12/30/2022 1250 by Hassell Halim, LPN Outcome: Adequate for Discharge 12/30/2022 1250 by Hassell Halim, LPN Outcome: Adequate for Discharge   Problem: Nutrition: Goal: Adequate nutrition will be maintained 12/30/2022 1250 by Hassell Halim, LPN Outcome: Adequate for Discharge 12/30/2022 1250 by Hassell Halim, LPN Outcome: Adequate for Discharge   Problem: Coping: Goal: Level of anxiety will decrease 12/30/2022 1250 by Hassell Halim, LPN Outcome: Adequate for Discharge 12/30/2022 1250 by Hassell Halim, LPN Outcome: Adequate for Discharge   Problem: Elimination: Goal: Will not experience complications related to  bowel motility 12/30/2022 1250 by Hassell Halim, LPN Outcome: Adequate for Discharge 12/30/2022 1250 by Hassell Halim, LPN Outcome: Adequate for Discharge Goal: Will not experience complications related to urinary retention 12/30/2022 1250 by Hassell Halim, LPN Outcome: Adequate for Discharge 12/30/2022 1250 by Hassell Halim, LPN Outcome: Adequate for Discharge   Problem: Pain Managment: Goal: General experience of comfort will improve 12/30/2022 1250 by Hassell Halim, LPN Outcome: Adequate for Discharge 12/30/2022 1250 by Hassell Halim, LPN Outcome: Adequate for Discharge   Problem: Safety: Goal: Ability to remain free from injury will improve 12/30/2022 1250 by Hassell Halim, LPN Outcome: Adequate for Discharge 12/30/2022 1250 by Hassell Halim, LPN Outcome: Adequate for Discharge   Problem: Skin Integrity: Goal: Risk for impaired skin integrity will decrease 12/30/2022 1250 by Hassell Halim, LPN Outcome: Adequate for Discharge 12/30/2022 1250 by Hassell Halim, LPN Outcome: Adequate for Discharge

## 2022-12-30 NOTE — Discharge Summary (Signed)
Physician Discharge Summary  Autumn Parrish IHK:[id number] DOB: 30-Nov-1951 DOA: 12/27/2022  PCP: [facility] [place] date: 12/27/2022 Discharge date: 12/30/2022  Admitted From: Home Disposition: Home with home health  Recommendations for Outpatient Follow-up:  Follow up with PCP in 1-2 weeks Follow-up with podiatry, Dr. Sherryle Lis  Continue antibiotics with doxycycline 100 mg p.o. twice daily x 14 days per podiatry recommendations Weightbearing as tolerates right lower extremity with postoperative shoe Follow-up finalized operative culture which was pending at time of discharge, Gram stain showed no organisms or no WBCs.  Home Health: PT/OT Equipment/Devices: Rolling walker  Discharge Condition: Stable CODE STATUS: Full code Diet recommendation: Heart healthy diet  History of present illness:  Autumn Parrish is a 72 year old female with past medical history significant for essential pretension, hypothyroidism, COPD, ADHD, morbid obesity who presented to [facility] ED on 1/19 with progressive right foot infection associated with pain.  Patient was seen by podiatry on 1/18 with concern for infection in which she was referred to the ED for further evaluation.  No history of trauma or prior infections.  Workup in the ED with imaging notable for bone destruction/osteomyelitis of the tuft of the distal right phalanx.  Podiatry was consulted and [facility] consulted for admission for further evaluation management of osteomyelitis of the right second toe distal phalanx  Hospital course:  Right second toe osteomyelitis with surrounding cellulitis Patient presenting to ED with right foot infection associated with the pain.  Imaging consistent with osteomyelitis.  Patient was started on empiric antibiotics with vancomycin and Zosyn.  MRSA PCR was negative.  Podiatry was consulted and patient underwent amputation of right second toe by Dr. Sherryle Lis on 1/21.  Operative culture with  no organisms and WBCs on Gram stain, further culture pending at time of discharge.  Podiatry recommended discharge home on doxycycline 100 mg p.o. twice daily for 14 days for surrounding cellulitis.  Patient weightbearing as tolerates right lower extremity with surgical shoe in place.  Continue dressing in place until follows up with podiatry outpatient.  Hypokalemia Repleted during hospitalization  Hyponatremia Now resolved following IV fluid hydration.  Non-anion gap metabolic acidosis: Resolved Etiology likely secondary to dehydration, resolved with IV fluid hydration.  History of atrial flutter Underwent ablation 2019.  Continue metoprolol.  Outpatient follow-up with cardiology.  Depression/anxiety: Continue Celexa  ADHD: Continue Adderall  Hypothyroidism: Continue levothyroxine  Hyperlipidemia: Continue statin  History of glaucoma: Continue home eyedrops  COPD, stable Continue home inhalers  Morbid obesity Body mass index is 40.53 kg/m.  Discussed with patient needs for aggressive lifestyle changes/weight loss as this complicates all facets of care.  Outpatient follow-up with PCP.  May benefit from bariatric evaluation outpatient.  Discharge Diagnoses:  Principal Problem:   Osteomyelitis ([place]) Active Problems:   Typical atrial flutter ([facility])   Morbid (severe) obesity due to excess calories ([facility])   COPD (chronic obstructive pulmonary disease) ([place])   Fall at home, initial encounter   Hypothyroidism   HTN (hypertension)   Osteomyelitis of second toe of right foot [facility])    Discharge Instructions  Discharge Instructions     Call MD for:  difficulty breathing, headache or visual disturbances   Complete by: As directed    Call MD for:  extreme fatigue   Complete by: As directed    Call MD for:  persistant dizziness or light-headedness   Complete by: As directed    Call MD for:  persistant nausea and vomiting   Complete  by: As directed    Call MD for:  severe  uncontrolled pain   Complete by: As directed    Call MD for:  temperature >100.4   Complete by: As directed    Diet - low sodium heart healthy   Complete by: As directed    Increase activity slowly   Complete by: As directed       Allergies as of 12/30/2022       Reactions   Ketorolac Anaphylaxis, Swelling, Other (See Comments)   Throat swelling Patient states that she can take this   Prochlorperazine Anaphylaxis, Shortness Of Breath, Swelling, Other (See Comments)   Throat swelling Patient states that this causes nausea only.   Promethazine Anaphylaxis, Swelling, Other (See Comments)   Throat swells Patient states that this causes nausea only.   Sulfa Antibiotics Anaphylaxis   Toradol [ketorolac Tromethamine] Anaphylaxis   Buprenorphine Nausea Only   Patient states that she has no reaction to this.   Nsaids Other (See Comments)   NSAIDS upset the stomach- can tolerate ibuprofen         Medication List     STOP taking these medications    clindamycin 300 MG capsule Commonly known as: CLEOCIN   doxycycline 100 MG tablet Commonly known as: VIBRA-TABS Replaced by: doxycycline 100 MG EC tablet   polyethylene glycol 17 g packet Commonly known as: MIRALAX / GLYCOLAX       TAKE these medications    acetaminophen 650 MG CR tablet Commonly known as: TYLENOL Take 1,300 mg by mouth every 8 (eight) hours as needed for pain.   amphetamine-dextroamphetamine 30 MG tablet Commonly known as: ADDERALL Take 1 tablet by mouth 2 (two) times daily.   Aspirin Low Dose 81 MG tablet Generic drug: aspirin EC Take 81 mg by mouth daily.   brimonidine 0.15 % ophthalmic solution Commonly known as: ALPHAGAN Place 1 drop into both eyes 2 (two) times daily.   citalopram 20 MG tablet Commonly known as: CELEXA Take 20 mg by mouth daily.   doxycycline 100 MG EC tablet Commonly known as: DORYX Take 1 tablet (100 mg total) by mouth 2 (two) times daily for 14 days. Replaces:  doxycycline 100 MG tablet   furosemide 20 MG tablet Commonly known as: LASIX Take 1 tablet (20 mg total) by mouth daily.   gentamicin ointment 0.1 % Commonly known as: GARAMYCIN Apply 1 Application topically 3 (three) times daily.   HYDROcodone-acetaminophen 5-325 MG tablet Commonly known as: NORCO/VICODIN Take 1-2 tablets by mouth every 6 (six) hours as needed for up to 7 days for moderate pain.   ibuprofen 200 MG tablet Commonly known as: ADVIL Take 400 mg by mouth every 6 (six) hours as needed for mild pain.   latanoprost 0.005 % ophthalmic solution Commonly known as: XALATAN Place 1 drop into both eyes at bedtime.   levothyroxine 75 MCG tablet Commonly known as: SYNTHROID Take 75 mcg by mouth daily before breakfast.   metoprolol succinate 25 MG 24 hr tablet Commonly known as: TOPROL-XL Take 1 tablet (25 mg total) by mouth daily. Take with or immediately following a meal.   oxybutynin 15 MG 24 hr tablet Commonly known as: DITROPAN XL Take 1 tablet (15 mg total) by mouth daily.   pravastatin 80 MG tablet Commonly known as: PRAVACHOL Take 80 mg by mouth daily.               Durable Medical Equipment  (From admission, onward)  Start     Ordered   12/30/22 0949  For home use only DME Walker rolling  Once       Question Answer Comment  Walker: With 5 Inch Wheels   Patient needs a walker to treat with the following condition Gait abnormality      12/30/22 [place], [place]. Schedule an appointment as soon as possible for a visit in 1 week(s).   Contact information: [place] [address] [phone]         Criselda Peaches, DPM. Schedule an appointment as soon as possible for a visit.   Specialty: Podiatry Contact information: [place] [address] [phone]                Allergies  Allergen Reactions   Ketorolac  Anaphylaxis, Swelling and Other (See Comments)    Throat swelling  Patient states that she can take this   Prochlorperazine Anaphylaxis, Shortness Of Breath, Swelling and Other (See Comments)    Throat swelling  Patient states that this causes nausea only.   Promethazine Anaphylaxis, Swelling and Other (See Comments)    Throat swells  Patient states that this causes nausea only.   Sulfa Antibiotics Anaphylaxis   Toradol [Ketorolac Tromethamine] Anaphylaxis   Buprenorphine Nausea Only    Patient states that she has no reaction to this.   Nsaids Other (See Comments)    NSAIDS upset the stomach- can tolerate ibuprofen     Consultations: Podiatry, Dr. Sherryle Lis   Procedures/Studies: MR FOOT RIGHT WO CONTRAST  Result Date: 12/28/2022 CLINICAL DATA:  Right foot infection worst at the second toe. Possible osteomyelitis. EXAM: MRI OF THE RIGHT FOREFOOT WITHOUT CONTRAST TECHNIQUE: Multiplanar, multisequence MR imaging of the right forefoot was performed. No intravenous contrast was administered. COMPARISON:  Plain films right foot 12/26/2022 and plain films right second toe 12/27/2022. FINDINGS: Bones/Joint/Cartilage Intense edema is present throughout the middle and distal phalanges of the second toe. Bony destructive change in the distal phalanx is better seen on the prior plain films. Milder degree of marrow edema is present throughout the proximal phalanx of the second toe. Mild, degenerative edema is seen about the first MTP joint. Imaged bones are otherwise unremarkable. Ligaments Intact. Muscles and Tendons No intramuscular fluid collection or mass. Mild fatty atrophy of intrinsic musculature the foot noted. Soft tissues No abscess is identified. Skin wound is seen at the distal phalanx of the second toe. IMPRESSION: Intense marrow edema in the middle and distal phalanges of the second toe consistent with osteomyelitis. Negative for abscess. Mild marrow edema throughout the proximal phalanx  of the second toe could be reactive or may be due to early osteomyelitis. Mild first MTP osteoarthritis. Electronically Signed   By: Inge Rise M.D.   On: 12/28/2022 09:27   DG Toe 2nd Right  Result Date: 12/27/2022 CLINICAL DATA:  Foot infection possible osteomyelitis EXAM: RIGHT SECOND TOE COMPARISON:  12/26/2022 FINDINGS: Significant soft tissue swelling second toe. Dressing artifacts. Bone destruction identified at distal phalanx with associated bone debris consistent with osteomyelitis. Degenerative changes DIP joint second toe without bone destruction. No fracture, dislocation or additional areas of bone destruction seen. IMPRESSION: Bone destruction involving the tuft of distal phalanx RIGHT second toe consistent with osteomyelitis. Electronically Signed   By: Lavonia Dana M.D.   On: 12/27/2022 18:03  Subjective: Patient seen examined bedside, resting calmly.  Lying in bed.  No specific complaints this morning.  Seen by PT and OT with recommendation of home health on discharge.  Podiatry recommended 14-day course of doxycycline on discharge.  Discussed with patient needs close follow-up with podiatry.  Denies headache, no dizziness, no chest pain, no palpitations, no shortness of breath, no abdominal pain, no fever/chills/night sweats, no nausea/vomiting/diarrhea, no focal weakness, no fatigue, no paresthesias.  No acute events overnight per nurse staff.  Discharge Exam: Vitals:   12/30/22 0551 12/30/22 0752  BP: (!) 131/55 (!) 140/65  Pulse: 61 63  Resp: 18 18  Temp: 98.6 F (37 C) 98.2 F (36.8 C)  SpO2: 97% 97%   Vitals:   12/29/22 1646 12/29/22 2100 12/30/22 0551 12/30/22 0752  BP: (!) 115/53 (!) 151/75 (!) 131/55 (!) 140/65  Pulse: 63 (!) 58 61 63  Resp: '18 17 18 18  '$ Temp: 98.1 F (36.7 C) 97.8 F (36.6 C) 98.6 F (37 C) 98.2 F (36.8 C)  TempSrc: Oral Oral Oral Oral  SpO2: 96% 100% 97% 97%  Weight:  113.9 kg    Height:  '5\' 6"'$  (1.676 m)      Physical  Exam: GEN: NAD, alert and oriented x 3, obese HEENT: NCAT, PERRL, EOMI, sclera clear, MMM PULM: CTAB w/o wheezes/crackles, normal respiratory effort, on room air CV: RRR w/o M/G/R GI: abd soft, NTND, NABS, no R/G/M MSK: no peripheral edema, right foot with surgical dressing in place, clean/dry/intact NEURO: CN II-XII intact, no focal deficits, sensation to light touch intact PSYCH: normal mood/affect Integumentary: Right foot with surgical dressing in place, noted right second toe amputation, dressing clean/dry/intact, no other concerning rashes/lesions/wounds noted on exposed skin surfaces.     The results of significant diagnostics from this hospitalization (including imaging, microbiology, ancillary and laboratory) are listed below for reference.     Microbiology: Recent Results (from the past 240 hour(s))  WOUND CULTURE     Status: None   Collection Time: 12/26/22 12:04 PM   Specimen: Abscess; Wound  Result Value Ref Range Status   MICRO NUMBER: [id number]  Final   SPECIMEN QUALITY: Adequate  Final   SOURCE: WOUND (SITE NOT SPECIFIED)  Final   STATUS: FINAL  Final   GRAM STAIN:   Final    No white blood cells seen No epithelial cells seen Moderate Gram positive cocci in pairs Few Gram negative bacilli   RESULT:   Final    A mix of organisms of questionable significance was recovered on culture and not further identified. (Note: Growth did not detect the presence of S.aureus, beta-hemolytic Streptococci or P.aeruginosa).  Surgical pcr screen     Status: None   Collection Time: 12/28/22  6:08 PM   Specimen: Nasal Mucosa; Nasal Swab  Result Value Ref Range Status   MRSA, PCR NEGATIVE NEGATIVE Final   Staphylococcus aureus NEGATIVE NEGATIVE Final    Comment: (NOTE) The Xpert SA Assay (FDA approved for NASAL specimens in patients 65 years of age and older), is one component of a comprehensive surveillance program. It is not intended to diagnose infection nor to guide or monitor  treatment. Performed at [facility], [place] [address] [place], [place] [address]   Aerobic/Anaerobic Culture w Gram Stain (surgical/deep wound)     Status: None (Preliminary result)   Collection Time: 12/29/22  9:14 AM   Specimen: Soft Tissue, Other  Result Value Ref Range Status   Specimen Description TISSUE  Final   Special Requests RIGHT SECOND TOE, OSTEOMYELITIS  Final   Gram Stain NO ORGANISMS SEEN NO WBC SEEN   Final   Culture   Final    FEW GRAM NEGATIVE RODS IDENTIFICATION AND SUSCEPTIBILITIES TO FOLLOW Performed at [facility], [place] [address], [place], [place] [address]    Report Status PENDING  Incomplete     Labs: BNP (last 3 results) Recent Labs    05/10/22 1733  BNP 883.2*   Basic Metabolic Panel: Recent Labs  Lab 12/27/22 1716 12/28/22 0207 12/28/22 0519 12/29/22 1049 12/30/22 0429  NA 132* 133*  --  137 136  K 3.5 3.4*  --  3.7 3.5  CL 102 102  --  108 107  CO2 21* 19*  --  17* 23  GLUCOSE 108* 115*  --  110* 119*  BUN 18 17  --  10 10  CREATININE 0.97 0.94 0.81 0.73 0.82  CALCIUM 8.8* 9.1  --  8.5* 8.3*  MG  --  1.8  --   --   --    Liver Function Tests: Recent Labs  Lab 12/27/22 1716  AST 14*  ALT 10  ALKPHOS 66  BILITOT 0.4  PROT 7.0  ALBUMIN 3.3*   No results for input(s): "LIPASE", "AMYLASE" in the last 168 hours. No results for input(s): "AMMONIA" in the last 168 hours. CBC: Recent Labs  Lab 12/27/22 1716 12/28/22 0207 12/28/22 0519 12/29/22 1049 12/30/22 0429  WBC 11.0* 11.7* 8.5 7.1 7.8  NEUTROABS 7.4 7.4  --   --   --   HGB 11.6* 12.2 11.0* 11.0* 11.3*  HCT 36.2 35.9* 34.0* 34.5* 35.8*  MCV 86.8 84.7 85.2 87.3 88.0  PLT 281 288 236 243 259   Cardiac Enzymes: No results for input(s): "CKTOTAL", "CKMB", "CKMBINDEX", "TROPONINI" in the last 168 hours. BNP: Invalid input(s): "POCBNP" CBG: No results for input(s): "GLUCAP" in the last 168 hours. D-Dimer No results for input(s): "DDIMER" in the last 72  hours. Hgb A1c Recent Labs    12/29/22 1049  HGBA1C 5.9*   Lipid Profile No results for input(s): "CHOL", "HDL", "LDLCALC", "TRIG", "CHOLHDL", "LDLDIRECT" in the last 72 hours. Thyroid function studies No results for input(s): "TSH", "T4TOTAL", "T3FREE", "THYROIDAB" in the last 72 hours.  Invalid input(s): "FREET3" Anemia work up No results for input(s): "VITAMINB12", "FOLATE", "FERRITIN", "TIBC", "IRON", "RETICCTPCT" in the last 72 hours. Urinalysis    Component Value Date/Time   COLORURINE YELLOW 05/10/2022 1945   APPEARANCEUR HAZY (A) 05/10/2022 1945   LABSPEC 1.017 05/10/2022 1945   PHURINE 5.0 05/10/2022 1945   GLUCOSEU NEGATIVE 05/10/2022 1945   HGBUR NEGATIVE 05/10/2022 1945   BILIRUBINUR NEGATIVE 05/10/2022 1945   KETONESUR NEGATIVE 05/10/2022 1945   PROTEINUR NEGATIVE 05/10/2022 1945   NITRITE NEGATIVE 05/10/2022 1945   LEUKOCYTESUR NEGATIVE 05/10/2022 1945   Sepsis Labs Recent Labs  Lab 12/28/22 0207 12/28/22 0519 12/29/22 1049 12/30/22 0429  WBC 11.7* 8.5 7.1 7.8   Microbiology Recent Results (from the past 240 hour(s))  WOUND CULTURE     Status: None   Collection Time: 12/26/22 12:04 PM   Specimen: Abscess; Wound  Result Value Ref Range Status   MICRO NUMBER: [id number]  Final   SPECIMEN QUALITY: Adequate  Final   SOURCE: WOUND (SITE NOT SPECIFIED)  Final   STATUS: FINAL  Final   GRAM STAIN:   Final    No white blood cells seen No epithelial cells seen Moderate Gram positive cocci in  pairs Few Gram negative bacilli   RESULT:   Final    A mix of organisms of questionable significance was recovered on culture and not further identified. (Note: Growth did not detect the presence of S.aureus, beta-hemolytic Streptococci or P.aeruginosa).  Surgical pcr screen     Status: None   Collection Time: 12/28/22  6:08 PM   Specimen: Nasal Mucosa; Nasal Swab  Result Value Ref Range Status   MRSA, PCR NEGATIVE NEGATIVE Final   Staphylococcus aureus NEGATIVE  NEGATIVE Final    Comment: (NOTE) The Xpert SA Assay (FDA approved for NASAL specimens in patients 69 years of age and older), is one component of a comprehensive surveillance program. It is not intended to diagnose infection nor to guide or monitor treatment. Performed at [facility], [place] [address], [place], [place] [address]   Aerobic/Anaerobic Culture w Gram Stain (surgical/deep wound)     Status: None (Preliminary result)   Collection Time: 12/29/22  9:14 AM   Specimen: Soft Tissue, Other  Result Value Ref Range Status   Specimen Description TISSUE  Final   Special Requests RIGHT SECOND TOE, OSTEOMYELITIS  Final   Gram Stain NO ORGANISMS SEEN NO WBC SEEN   Final   Culture   Final    FEW GRAM NEGATIVE RODS IDENTIFICATION AND SUSCEPTIBILITIES TO FOLLOW Performed at [facility], [place] [address], [place], [place] [address]    Report Status PENDING  Incomplete     Time coordinating discharge: Over 30 minutes  SIGNED:   Tramaine Sauls J [place] DO  [facility] 12/30/2022, 11:06 AM

## 2022-12-30 NOTE — Progress Notes (Signed)
Patient expressed to her nurse that her keys were misplaced in the ED. Went down to security to search for the patient's keys. No keys were found belonging to the patient.  Gomez Cleverly SWOT RN

## 2022-12-30 NOTE — Progress Notes (Signed)
DISCHARGE NOTE HOME Autumn Parrish to be discharged Home per MD order. Discussed prescriptions and follow up appointments with the patient. Prescriptions given to patient; medication list explained in detail. Patient verbalized understanding.  Skin clean, dry and intact without evidence of skin break down, no evidence of skin tears noted. IV catheter discontinued intact. Site without signs and symptoms of complications. Dressing and pressure applied. Pt denies pain at the site currently. No complaints noted.  Patient free of lines, drains, and wounds.   An After Visit Summary (AVS) was printed and given to the patient. Patient escorted via wheelchair, and discharged home via [facility].  Hassell Halim, LPN

## 2022-12-30 NOTE — Evaluation (Signed)
Physical Therapy Evaluation Patient Details Name: Autumn Parrish MRN: [id number] DOB: 07-21-1951 Today's Date: 12/30/2022  History of Present Illness  72 year old female admitted 1/19 s/p R 2nd toe amputation due to osteomyelitis. Past medical history of hypertension, hypothyroidism, COPD, ADHD, morbid obesity.  Clinical Impression  Patient is s/p above surgery resulting in functional limitations due to the deficits listed below (see PT Problem List). Mobilizing at Mod-I to supervision level. No evidence of LOB while using RW for support (baseline.) Will continue to monitor and progress during admission.  Patient will benefit from skilled PT to increase their independence and safety with mobility to allow discharge to the venue listed below.          Recommendations for follow up therapy are one component of a multi-disciplinary discharge planning process, led by the attending physician.  Recommendations may be updated based on patient status, additional functional criteria and insurance authorization.  Follow Up Recommendations Home health PT      Assistance Recommended at Discharge PRN  Patient can return home with the following  Assist for transportation;Assistance with cooking/housework    Equipment Recommendations Rolling walker (2 wheels) (If her RW cannot be retrieved/found from ED)  Recommendations for Other Services       Functional Status Assessment Patient has had a recent decline in their functional status and demonstrates the ability to make significant improvements in function in a reasonable and predictable amount of time.     Precautions / Restrictions Precautions Precautions: Fall Restrictions Weight Bearing Restrictions: Yes RLE Weight Bearing: Weight bearing as tolerated (with post op shoe)      Mobility  Bed Mobility               General bed mobility comments: in recliner    Transfers Overall transfer level: Modified independent Equipment used:  Rolling walker (2 wheels)               General transfer comment: Performed mod I from [facility] and recliner. Able to perform pericare without assist. Good stability with use of RW for support.    Ambulation/Gait Ambulation/Gait assistance: Supervision Gait Distance (Feet): 95 Feet Assistive device: Rolling walker (2 wheels) Gait Pattern/deviations: Step-through pattern, Step-to pattern, Decreased stride length, Decreased stance time - left, Antalgic, Trunk flexed Gait velocity: decr Gait velocity interpretation: <1.31 ft/sec, indicative of household ambulator   General Gait Details: Educated on safe AD use with RW. Height adjusted appropriately. Pt needs cues to bring RW closer to BOS in order to increase load on UEs and reduce load through LLE. No buckling or LOB noted.  Stairs            Wheelchair Mobility    Modified Rankin (Stroke Patients Only)       Balance Overall balance assessment: Mild deficits observed, not formally tested                                           Pertinent Vitals/Pain Pain Assessment Pain Assessment: Faces Faces Pain Scale: Hurts little more Pain Location: R foot Pain Descriptors / Indicators: Aching, Discomfort Pain Intervention(s): Monitored during session, Repositioned    Home Living Family/patient expects to be discharged to:: Private residence Living Arrangements: Alone Available Help at Discharge: Personal care attendant;Available PRN/intermittently Type of Home: Apartment Home Access: Elevator       Home Layout: One level Home Equipment: BSC/3in1;Tub bench (has  RW but lost in the ED)      Prior Function Prior Level of Function : Needs assist               ADLs Comments: modified independent with RW with mobility and ADL tasks; [facility] PCA assists with IADL, errands adn cleaning     Hand Dominance   Dominant Hand: Right    Extremity/Trunk Assessment   Upper Extremity Assessment Upper  Extremity Assessment: Defer to OT evaluation    Lower Extremity Assessment Lower Extremity Assessment: RLE deficits/detail RLE Deficits / Details: bandaged    Cervical / Trunk Assessment Cervical / Trunk Assessment: Other exceptions (increased body habitus)  Communication   Communication: No difficulties  Cognition Arousal/Alertness: Awake/alert Behavior During Therapy: WFL for tasks assessed/performed Overall Cognitive Status: Within Functional Limits for tasks assessed                                          General Comments      Exercises General Exercises - Lower Extremity Ankle Circles/Pumps: AROM, Both, 10 reps, Seated Quad Sets: Strengthening, 5 reps, Both, Seated   Assessment/Plan    PT Assessment Patient needs continued PT services  PT Problem List Decreased strength;Decreased range of motion;Decreased activity tolerance;Decreased balance;Decreased mobility;Decreased knowledge of use of DME;Obesity;Pain       PT Treatment Interventions DME instruction;Gait training;Functional mobility training;Therapeutic activities;Therapeutic exercise;Balance training;Neuromuscular re-education;Patient/family education    PT Goals (Current goals can be found in the Care Plan section)  Acute Rehab PT Goals Patient Stated Goal: go home PT Goal Formulation: With patient Time For Goal Achievement: 01/06/23 Potential to Achieve Goals: Good    Frequency Min 4X/week     Co-evaluation               AM-PAC PT "6 Clicks" Mobility  Outcome Measure Help needed turning from your back to your side while in a flat bed without using bedrails?: None Help needed moving from lying on your back to sitting on the side of a flat bed without using bedrails?: None Help needed moving to and from a bed to a chair (including a wheelchair)?: None Help needed standing up from a chair using your arms (e.g., wheelchair or bedside chair)?: None Help needed to walk in hospital  room?: A Little Help needed climbing 3-5 steps with a railing? : A Little 6 Click Score: 22    End of Session Equipment Utilized During Treatment: Gait belt Activity Tolerance: Patient tolerated treatment well Patient left: in chair;with call bell/phone within reach   PT Visit Diagnosis: Unsteadiness on feet (R26.81);Other abnormalities of gait and mobility (R26.89);Difficulty in walking, not elsewhere classified (R26.2);Pain Pain - Right/Left: Left Pain - part of body: Ankle and joints of foot    Time: 0951-1004 PT Time Calculation (min) (ACUTE ONLY): 13 min   Charges:   PT Evaluation $PT Eval Low Complexity: [place], PT, DPT Physical Therapist Acute Rehabilitation Services [place]   Ellouise Newer 12/30/2022, 10:48 AM

## 2022-12-30 NOTE — TOC Transition Note (Signed)
Transition of Care [facility]) - CM/SW Discharge Note   Patient Details  Name: Autumn Parrish MRN: [id number] Date of Birth: [birth year]  Transition [facility]) CM/SW Contact:  Tom-Johnson, Renea Ee, RN Phone Number: 12/30/2022, 1:03 PM   Clinical Narrative:     Patient is scheduled for discharge today. Patient is scheduled with [facility]. CM spoke with Raquel Sarna [phone]) at [facility] about patient's discharge needs. Patient is currently active with Home health services with [facility]. CM informed Raquel Sarna that PT recommends therapy time increased. [facility] MD to view d/c summary and write prescription for meds needed. Patient's RW was retrieved from the ED and delivered to her room at bedside. [facility] to transport at discharge. No further TOC needs noted.       Final next level of care: Home w Home Health Services Barriers to Discharge: Barriers Resolved   Patient Goals and CMS Choice CMS Medicare.gov Compare Post Acute Care list provided to:: Patient Choice offered to / list presented to : Patient  Discharge Placement                  Patient to be transferred to facility by: [facility]      Discharge Plan and Services Additional resources added to the After Visit Summary for                  DME Arranged: N/A DME Agency: NA       HH Arranged: PT, OT [place] Agency: Other - See comment ([facility]) Date HH Agency Contacted: 12/30/22 Time [place]: 1238 Representative spoke with at [place]: Hedda Slade  Social Determinants of Health ([place]) Interventions SDOH Screenings   Food Insecurity: No Food Insecurity (12/28/2022)  Housing: Low Risk  (12/28/2022)  Transportation Needs: No Transportation Needs (12/28/2022)  Utilities: Not At Risk (12/28/2022)  Tobacco Use: Medium Risk (12/30/2022)     Readmission Risk Interventions     No data to display

## 2022-12-30 NOTE — Evaluation (Signed)
Occupational Therapy Evaluation Patient Details Name: Autumn Parrish MRN: [id number] DOB: January 07, 1951 Today's Date: 12/30/2022   History of Present Illness 72 year old female s/p R 2nd toe amputation due to osteomyelitis. Past medical history of hypertension, hypothyroidism, COPD, ADHD, morbid obesity.   Clinical Impression   PTA pt lives alone and has a PCA through [facility] who assists with IADL tasks. Able to mobilize with S @ RW level. VC for positioning self in RW and encouraged pt to push more weight through her heel  when mobilizing. Able to complete ADL tasks using AE, DME and compensatory strategies with S. Pt would benefit from [facility] follow up - pt in agreement. All further OT to be addressed by [facility].       Recommendations for follow up therapy are one component of a multi-disciplinary discharge planning process, led by the attending physician.  Recommendations may be updated based on patient status, additional functional criteria and insurance authorization.   Follow Up Recommendations  Home health OT     Assistance Recommended at Discharge Intermittent Supervision/Assistance  Patient can return home with the following Assistance with cooking/housework;Assist for transportation    Functional Status Assessment  Patient has had a recent decline in their functional status and demonstrates the ability to make significant improvements in function in a reasonable and predictable amount of time.  Equipment Recommendations  Other (comment) (RW if not found)    Recommendations for Other Services PT consult     Precautions / Restrictions Precautions Precautions: Fall Restrictions Weight Bearing Restrictions: Yes RLE Weight Bearing: Weight bearing as tolerated (with post op shoe)      Mobility Bed Mobility Overal bed mobility: Independent                  Transfers Overall transfer level: Needs assistance   Transfers: Sit to/from Stand Sit to Stand: Supervision                   Balance Overall balance assessment: Mild deficits observed, not formally tested                                         ADL either performed or assessed with clinical judgement   ADL Overall ADL's : Needs assistance/impaired                                     Functional mobility during ADLs: Supervision/safety;Rolling walker (2 wheels);Cueing for safety General ADL Comments: pt has a Secondary school teacher . Educated on using the reacher to help with LB dressing; Recommended donning/doffing post op shoe when in bed due to difficulty with R knee flexion - pt verbalized understanding. Recommended she complete bird baths until MD clears to shower,. also encouraged her to work Henderson OT on bathroom tub trasnfers. Educated on strategies to redue risk of falls     Museum/gallery curator      Pertinent Vitals/Pain Pain Assessment Pain Assessment: Faces Faces Pain Scale: Hurts little more Pain Location: R foot Pain Descriptors / Indicators: Aching, Discomfort Pain Intervention(s): Limited activity within patient's tolerance, Premedicated before session     Hand Dominance Right   Extremity/Trunk Assessment Upper Extremity Assessment Upper Extremity Assessment: Overall WFL for tasks assessed (hx of bursitis B shoulders)  Lower Extremity Assessment Lower Extremity Assessment: Defer to PT evaluation   Cervical / Trunk Assessment Cervical / Trunk Assessment: Other exceptions (increased body habitus)   Communication Communication Communication: No difficulties   Cognition Arousal/Alertness: Awake/alert Behavior During Therapy: WFL for tasks assessed/performed Overall Cognitive Status: Within Functional Limits for tasks assessed                                       General Comments       Exercises     Shoulder Instructions      Home Living Family/patient expects to be discharged to:: Private  residence Living Arrangements: Alone Available Help at Discharge: Personal care attendant;Available PRN/intermittently Type of Home: Apartment Home Access: Elevator     Home Layout: One level     Bathroom Shower/Tub: Teacher, early years/pre: Standard Bathroom Accessibility: Yes How Accessible: Accessible via walker;Accessible via wheelchair Home Equipment: BSC/3in1;Tub bench (has RW but lost in the ED)          Prior Functioning/Environment Prior Level of Function : Needs assist               ADLs Comments: modified independent with RW with mobility and ADL tasks; [facility] PCA assists with IADL, errands adn cleaning        OT Problem List: Decreased strength;Decreased range of motion;Impaired balance (sitting and/or standing);Decreased safety awareness;Decreased knowledge of use of DME or AE;Pain;Obesity      OT Treatment/Interventions:      OT Goals(Current goals can be found in the care plan section) Acute Rehab OT Goals Patient Stated Goal: to go home OT Goal Formulation: All assessment and education complete, DC therapy  OT Frequency:      Co-evaluation              AM-PAC OT "6 Clicks" Daily Activity     Outcome Measure Help from another person eating meals?: None Help from another person taking care of personal grooming?: None Help from another person toileting, which includes using toliet, bedpan, or urinal?: None Help from another person bathing (including washing, rinsing, drying)?: A Little Help from another person to put on and taking off regular upper body clothing?: None Help from another person to put on and taking off regular lower body clothing?: A Little 6 Click Score: 22   End of Session Equipment Utilized During Treatment: Gait belt;Rolling walker (2 wheels) Nurse Communication: Mobility status;Other (comment) (DC needs)  Activity Tolerance: Patient tolerated treatment well Patient left: in chair;with call bell/phone within  reach  OT Visit Diagnosis: Unsteadiness on feet (R26.81);Other abnormalities of gait and mobility (R26.89);Muscle weakness (generalized) (M62.81);Pain Pain - Right/Left: Right Pain - part of body: Ankle and joints of foot                Time: 4166-0630 OT Time Calculation (min): 24 min Charges:  OT General Charges $OT Visit: 1 Visit OT Evaluation $OT Eval Moderate Complexity: 1 Mod OT Treatments $Self Care/Home Management : 8-22 mins  Maurie Boettcher, OT/L   Acute OT Clinical Specialist Acute Rehabilitation Services Pager [phone] Office [phone]   [facility] 12/30/2022, 9:57 AM

## 2023-01-01 ENCOUNTER — Ambulatory Visit: Payer: Medicare (Managed Care) | Admitting: Podiatry

## 2023-01-01 ENCOUNTER — Telehealth: Payer: Self-pay | Admitting: *Deleted

## 2023-01-01 LAB — SURGICAL PATHOLOGY

## 2023-01-01 NOTE — Telephone Encounter (Signed)
Patient has been updated not to get foot wet until post op, verbalized understanding, will do.

## 2023-01-01 NOTE — Telephone Encounter (Signed)
Patient is wanting to know if she can take a shower or get her foot wet? please advise.

## 2023-01-03 LAB — AEROBIC/ANAEROBIC CULTURE W GRAM STAIN (SURGICAL/DEEP WOUND)

## 2023-01-07 ENCOUNTER — Ambulatory Visit: Payer: Medicare (Managed Care) | Admitting: Podiatry

## 2023-01-07 ENCOUNTER — Other Ambulatory Visit: Payer: Self-pay | Admitting: Internal Medicine

## 2023-01-07 DIAGNOSIS — Z1231 Encounter for screening mammogram for malignant neoplasm of breast: Secondary | ICD-10-CM

## 2023-01-14 ENCOUNTER — Telehealth: Payer: Self-pay

## 2023-01-14 ENCOUNTER — Other Ambulatory Visit (HOSPITAL_COMMUNITY): Payer: Self-pay

## 2023-01-14 ENCOUNTER — Ambulatory Visit (INDEPENDENT_AMBULATORY_CARE_PROVIDER_SITE_OTHER): Payer: Medicare (Managed Care) | Admitting: Podiatry

## 2023-01-14 DIAGNOSIS — M869 Osteomyelitis, unspecified: Secondary | ICD-10-CM | POA: Diagnosis not present

## 2023-01-14 MED ORDER — AMOXICILLIN-POT CLAVULANATE 875-125 MG PO TABS
1.0000 | ORAL_TABLET | Freq: Two times a day (BID) | ORAL | 0 refills | Status: DC
  Filled 2023-01-14: qty 20, 10d supply, fill #0

## 2023-01-14 NOTE — Telephone Encounter (Signed)
Called patient. Left message for patient to please call back Dr. Sherryle Lis would like to continue her antibiotics. We need a [facility] to [facility] in [facility].  If she needs a written prescription, we need to know where to mail it. Thanks

## 2023-01-16 ENCOUNTER — Telehealth: Payer: Self-pay | Admitting: *Deleted

## 2023-01-16 NOTE — Telephone Encounter (Signed)
Patient is calling to ask if she can take a shower. She is wanting her prescription sent to [facility]-fax:[phone]([facility]), but has to be faxed to [facility] and they will take care of it.

## 2023-01-16 NOTE — Telephone Encounter (Signed)
Patient updated concerning taking a shower and antibiotic has been faxed to [facility].

## 2023-01-19 ENCOUNTER — Encounter: Payer: Self-pay | Admitting: Podiatry

## 2023-01-19 NOTE — Progress Notes (Signed)
  Subjective:  Patient ID: Autumn Parrish, female    DOB: 1951-12-04,  MRN: [id number]  Chief Complaint  Patient presents with   cellulitis of toe    2 week follow up right great toe ulcer    DOS: 12/29/2022 Procedure: Amputation right second toe  72 y.o. female returns for post-op check.  She has not changed the dressing, she missed 2 appointments because she did not have transportation here  Review of Systems: Negative except as noted in the HPI. Denies N/V/F/Ch.   Objective:  There were no vitals filed for this visit. There is no height or weight on file to calculate BMI. Constitutional Well developed. Well nourished.  Vascular Foot warm and well perfused. Capillary refill normal to all digits.  Calf is soft and supple, no posterior calf or knee pain, negative Homans' sign  Neurologic Normal speech. Oriented to person, place, and time. Epicritic sensation to light touch grossly reduced bilaterally.  Dermatologic Sutures and staples mostly intact.  There is maceration.  Some slight erythema, serous drainage.  Orthopedic:`` She has no tenderness to palpation noted about the surgical site.    Assessment:   1. Osteomyelitis of second toe of right foot ([place])    Plan:  Patient was evaluated and treated and all questions answered.  S/p foot surgery right -Progressing as expected post-operatively. -WB Status: WBAT in surgical shoe -Sutures: Removed in 2 weeks. -Medications: Continue Augmentin twice daily, [facility]ill [facility] -Foot redressed.  She should change every other day with Betadine to incision  Return in about 2 weeks (around 01/28/2023) for wound care, post op (no x-rays).

## 2023-01-29 ENCOUNTER — Ambulatory Visit: Payer: Medicare (Managed Care) | Admitting: Podiatry

## 2023-01-30 ENCOUNTER — Ambulatory Visit: Payer: Medicare (Managed Care) | Admitting: Podiatry

## 2023-02-06 ENCOUNTER — Ambulatory Visit (INDEPENDENT_AMBULATORY_CARE_PROVIDER_SITE_OTHER): Payer: Medicare (Managed Care)

## 2023-02-06 ENCOUNTER — Ambulatory Visit (INDEPENDENT_AMBULATORY_CARE_PROVIDER_SITE_OTHER): Payer: Medicare (Managed Care) | Admitting: Podiatry

## 2023-02-06 DIAGNOSIS — M19072 Primary osteoarthritis, left ankle and foot: Secondary | ICD-10-CM | POA: Diagnosis not present

## 2023-02-06 DIAGNOSIS — M79675 Pain in left toe(s): Secondary | ICD-10-CM | POA: Diagnosis not present

## 2023-02-06 DIAGNOSIS — B351 Tinea unguium: Secondary | ICD-10-CM | POA: Diagnosis not present

## 2023-02-06 DIAGNOSIS — M79674 Pain in right toe(s): Secondary | ICD-10-CM

## 2023-02-06 DIAGNOSIS — L97512 Non-pressure chronic ulcer of other part of right foot with fat layer exposed: Secondary | ICD-10-CM | POA: Diagnosis not present

## 2023-02-06 DIAGNOSIS — M84375A Stress fracture, left foot, initial encounter for fracture: Secondary | ICD-10-CM

## 2023-02-06 NOTE — Progress Notes (Signed)
  Subjective:  Patient ID: Autumn Parrish, female    DOB: 01-04-1951,  MRN: [id number]  Chief Complaint  Patient presents with   Foot Ulcer    2 weeks (around 01/28/2023) for wound care, post op    DOS: 12/29/2022 Procedure: Amputation right second toe  72 y.o. female returns for post-op check.  She says that is doing better she has been changing the dressing and took her antibiotics.  She has a new issue with pain in the left foot  Review of Systems: Negative except as noted in the HPI. Denies N/V/F/Ch.   Objective:  There were no vitals filed for this visit. There is no height or weight on file to calculate BMI. Constitutional Well developed. Well nourished.  Vascular Foot warm and well perfused. Capillary refill normal to all digits.  Calf is soft and supple, no posterior calf or knee pain, negative Homans' sign  Neurologic Normal speech. Oriented to person, place, and time. Epicritic sensation to light touch grossly reduced bilaterally.  Dermatologic Small area of open ulceration and central portion of incision, remainder is healed, open ulceration has exposed subcutaneous tissue measures approximately 1.0 x 0.8 cm x 0.3 cm serous drainage, no signs of infection, fibrogranular wound bed  Orthopedic:`` She has no tenderness to palpation noted about the surgical site.  On her left foot she has a well-healed surgical scar in the area of tenderness over the fourth metatarsal that is tender   Radiographs of the left foot taken today show previous heterotopic ossification between third and fourth metatarsals, no clear evidence of current stress fracture or fracture  Assessment:   1. Stress fracture of left foot, initial encounter   2. Ulcer of right foot with fat layer exposed ([place])   3. Pain due to onychomycosis of toenails of both feet    Plan:  Patient was evaluated and treated and all questions answered.  Ulcer right foot Doing well and appears to be healing now.   Full-thickness excisional debridement was carried out with a #312 blade today to remove all nonviable tissue and slough and biofilm.  There is mild serous drainage.  No signs of infection.  Posterior be measurements are noted above.  Dressed with Iodosorb and dry sterile dressings.  Continue home local wound care for now.  Hopefully should heal uneventfully.  Discussed the etiology and treatment options for the condition in detail with the patient. Educated patient on the topical and oral treatment options for mycotic nails. Recommended debridement of the nails today. Sharp and mechanical debridement performed of all painful and mycotic nails today. Nails debrided in length and thickness using a nail nipper to level of comfort. Discussed treatment options including appropriate shoe gear. Follow up as needed for painful nails.  Her left foot pain appears to be consistent with an area of previous heterotopic ossification and stress reaction.  She previously had neuroma surgery approximately 30 years ago in this area she says.  I reviewed her previous radiographs from several years ago in 2001 which was present as well at that time.  Does not appear to show any acute changes.  Suspect likely this is overload and stress reaction secondary to changes in ambulation while she is healing her right foot surgery.  Continue regular weightbearing as tolerated discussed using Voltaren gel for pain relief.  If this worsens she will notify me.  Return in about 4 weeks (around 03/06/2023) for wound care.

## 2023-02-27 ENCOUNTER — Ambulatory Visit: Payer: Medicare (Managed Care)

## 2023-03-06 ENCOUNTER — Ambulatory Visit: Payer: Medicare (Managed Care) | Admitting: Podiatry

## 2023-03-06 DIAGNOSIS — M76822 Posterior tibial tendinitis, left leg: Secondary | ICD-10-CM

## 2023-03-06 DIAGNOSIS — M2142 Flat foot [pes planus] (acquired), left foot: Secondary | ICD-10-CM | POA: Diagnosis not present

## 2023-03-06 DIAGNOSIS — M2141 Flat foot [pes planus] (acquired), right foot: Secondary | ICD-10-CM | POA: Diagnosis not present

## 2023-03-06 DIAGNOSIS — L97512 Non-pressure chronic ulcer of other part of right foot with fat layer exposed: Secondary | ICD-10-CM

## 2023-03-06 MED ORDER — IBUPROFEN 600 MG PO TABS: 600.0000 mg | ORAL_TABLET | Freq: Four times a day (QID) | ORAL | 0 refills | Status: AC | PRN

## 2023-03-06 NOTE — Progress Notes (Signed)
  Subjective:  Patient ID: Autumn Parrish, female    DOB: October 05, 1951,  MRN: [id number]  Chief Complaint  Patient presents with   Foot Ulcer    DOS: 12/29/2022 Procedure: Amputation right second toe  72 y.o. female returns for post-op check.  Her left foot pain is now in the ankle, says the toe amputation site is doing well  Review of Systems: Negative except as noted in the HPI. Denies N/V/F/Ch.   Objective:  There were no vitals filed for this visit. There is no height or weight on file to calculate BMI. Constitutional Well developed. Well nourished.  Vascular Foot warm and well perfused. Capillary refill normal to all digits.  Calf is soft and supple, no posterior calf or knee pain, negative Homans' sign  Neurologic Normal speech. Oriented to person, place, and time. Epicritic sensation to light touch grossly reduced bilaterally.  Dermatologic Prior amputation site is well-healed, third toe now has fissuring and ulceration present  Orthopedic: She has pain and tenderness along the posterior tibial tendon with resisted inversion    Radiographs of the left foot taken today show previous heterotopic ossification between third and fourth metatarsals, no clear evidence of current stress fracture or fracture  Assessment:   1. Ulcer of right foot with fat layer exposed ([place])   2. Posterior tibial tendinitis, left   3. Pes planus of both feet    Plan:  Patient was evaluated and treated and all questions answered.  Ulcer right foot Second toe amputation site is now healed, she has now ulceration on the right third toe.  Full-thickness excisional debridement was carried out with a #312 blade today to remove all nonviable tissue and slough and biofilm.  There is mild serous drainage.  No signs of infection.  Posterior debridement measurements are noted above.  Dressed with Iodosorb and dry sterile dressings.  Continue home local wound care for now.  We discussed the impact of certain  shoes on this and foot hygiene that can lead to the skin cracking and ulcerations     Today she has new left foot pain along the posterior tibial tendon she has pes planus deformity.  I recommend utilizing a lace up ankle brace which she think she may have at home.  I recommended home physical therapy plan and this was given to her.  Return in about 4 weeks (around 04/03/2023) for wound care.

## 2023-03-06 NOTE — Patient Instructions (Signed)
Posterior Tibial Tendinitis  Posterior tibial tendinitis is irritation of a tendon called the posterior tibial tendon. Your posterior tibial tendon is a cord-like tissue that connects bones of your lower leg and foot to a muscle that: Supports your arch. Helps you raise up on your toes. Helps you turn your foot down and in. This condition causes foot and ankle pain. It can also lead to a flat foot. What are the causes? This condition is most often caused by repeated stress to the tendon (overuse injury). It can also be caused by a sudden injury that stresses the tendon, such as landing on your foot after jumping or falling. What increases the risk? This condition is more likely to develop in: People who play a sport that involves putting a lot of pressure on the feet, such as: Basketball. Tennis. Soccer. Hockey. Runners. Females who are older than 72 years of age and are overweight. People with diabetes. People with decreased foot stability. People with flat feet. What are the signs or symptoms? Symptoms include: Pain in the inner ankle. Pain at the arch of your foot. Pain that gets worse with running, walking, or standing. Swelling on the inside of your ankle and foot. Weakness in your ankle or foot. Inability to stand up on tiptoe. Flattening of the arch of your foot. How is this diagnosed? This condition may be diagnosed based on: Your symptoms. Your medical history. A physical exam. Tests, such as: X-ray. MRI. Ultrasound. How is this treated? This condition may be treated by: Putting ice to the injured area. Taking NSAIDs, such as ibuprofen, to reduce pain and swelling. Wearing a special shoe or shoe insert to support your arch (orthotic). Having physical therapy. Replacing high-impact exercise with low-impact exercise, such as swimming or cycling. If your symptoms do not improve with these treatments, you may need to wear a splint, removable walking boot, or short  leg cast for 6-8 weeks to keep your foot and ankle still (immobilized). Follow these instructions at home: If you have a cast, splint, or boot: Keep it clean and dry. Check the skin around it every day. Tell your health care provider about any concerns. If you have a cast: Do not stick anything inside it to scratch your skin. Doing that increases your risk of infection. You may put lotion on dry skin around the edges of the cast. Do not put lotion on the skin underneath the cast. If you have a splint or boot: Wear it as told by your health care provider. Remove it only as told by your health care provider. Loosen it if your toes tingle, become numb, or turn cold and blue. Bathing Do not take baths, swim, or use a hot tub until your health care provider approves. Ask your health care provider if you may take showers. If your cast, splint, or boot is not waterproof: Do not let it get wet. Cover it with a waterproof covering while you take a bath or a shower. Managing pain and swelling   If directed, put ice on the injured area. If you have a removable splint or boot, remove it as told by your health care provider. Put ice in a plastic bag. Place a towel between your skin and the bag or between your cast and the bag. Leave the ice on for 20 minutes, 2-3 times a day. Move your toes often to reduce stiffness and swelling. Raise (elevate) the injured area above the level of your heart while you are sitting   or lying down. Activity Do not use the injured foot to support your body weight until your health care provider says that you can. Use crutches as told by your health care provider. Do not do activities that make pain or swelling worse. Ask your health care provider when it is safe to drive if you have a cast, splint, or boot on your foot. Return to your normal activities as told by your health care provider. Ask your health care provider what activities are safe for you. Do exercises as  told by your health care provider. General instructions Take over-the-counter and prescription medicines only as told by your health care provider. If you have an orthotic, use it as told by your health care provider. Keep all follow-up visits as told by your health care provider. This is important. How is this prevented? Wear footwear that is appropriate to your athletic activity. Avoid athletic activities that cause pain or swelling in your ankle or foot. Before being active, do range-of-motion and stretching exercises. If you develop pain or swelling while training, stop training. If you have pain or swelling that does not improve after a few days of rest, see your health care provider. If you start a new athletic activity, start gradually so you can build up your strength and flexibility. Contact a health care provider if: Your symptoms get worse. Your symptoms do not improve in 6-8 weeks. You develop new, unexplained symptoms. Your splint, boot, or cast gets damaged. Summary Posterior tibial tendinitis is irritation of a tendon called the posterior tibial tendon. This condition is most often caused by repeated stress to the tendon (overuse injury). This condition causes foot pain and ankle pain. It can also lead to a flat foot. This condition may be treated by not doing high-impact activities, applying ice, having physical therapy, wearing orthotics, and wearing a cast, splint, or boot if needed. This information is not intended to replace advice given to you by your health care provider. Make sure you discuss any questions you have with your health care provider. Document Revised: 03/23/2019 Document Reviewed: 01/28/2019 Elsevier Patient Education  2020 [employer]  Posterior Tibial Tendinitis Rehab Ask your health care provider which exercises are safe for you. Do exercises exactly as told by your health care provider and adjust them as directed. It is normal to feel mild  stretching, pulling, tightness, or discomfort as you do these exercises. Stop right away if you feel sudden pain or your pain gets worse. Do not begin these exercises until told by your health care provider. Stretching and range-of-motion exercises These exercises warm up your muscles and joints and improve the movement and flexibility in your ankle and foot. These exercises may also help to relieve pain. Standing wall calf stretch, knee straight   Stand with your hands against a wall. Extend your left / right leg behind you, and bend your front knee slightly. If directed, place a folded washcloth under the arch of your foot for support. Point the toes of your back foot slightly inward. Keeping your heels on the floor and your back knee straight, shift your weight toward the wall. Do not allow your back to arch. You should feel a gentle stretch in your upper left / right calf. Hold this position for 10 seconds. Repeat 10 times. Complete this exercise 2 times a day. Standing wall calf stretch, knee bent Stand with your hands against a wall. Extend your left / right leg behind you, and bend your front   knee slightly. If directed, place a folded washcloth under the arch of your foot for support. Point the toes of your back foot slightly inward. Unlock your back knee so it is bent. Keep your heels on the floor. You should feel a gentle stretch deep in your lower left / right calf. Hold this position for 10 seconds. Repeat 10 times. Complete this exercise 2 times a day. Strengthening exercises These exercises build strength and endurance in your ankle and foot. Endurance is the ability to use your muscles for a long time, even after they get tired. Ankle inversion with band Secure one end of a rubber exercise band or tubing to a fixed object, such as a table leg or a pole, that will stay still when the band is pulled. Loop the other end of the band around the middle of your left / right foot. Sit  on the floor facing the object with your left / right leg extended. The band or tube should be slightly tense when your foot is relaxed. Leading with your big toe, slowly bring your left / right foot and ankle inward, toward your other foot (inversion). Hold this position for 10 seconds. Slowly return your foot to the starting position. Repeat 10 times. Complete this exercise 2 times a day. Towel curls   Sit in a chair on a non-carpeted surface, and put your feet on the floor. Place a towel in front of your feet. Keeping your heel on the floor, put your left / right foot on the towel. Pull the towel toward you by grabbing the towel with your toes and curling them under. Keep your heel on the floor while you do this. Let your toes relax. Grab the towel with your toes again. Keep going until the towel is completely underneath your foot. Repeat 10 times. Complete this exercise 2 times a day. Balance exercise This exercise improves or maintains your balance. Balance is important in preventing falls. Single leg stand Without wearing shoes, stand near a railing or in a doorway. You may hold on to the railing or door frame as needed for balance. Stand on your left / right foot. Keep your big toe down on the floor and try to keep your arch lifted. If balancing in this position is too easy, try the exercise with your eyes closed or while standing on a pillow. Hold this position for 10 seconds. Repeat 10 times. Complete this exercise 2 times a day. This information is not intended to replace advice given to you by your health care provider. Make sure you discuss any questions you have with your health care provider.  

## 2023-03-19 ENCOUNTER — Ambulatory Visit
Admission: RE | Admit: 2023-03-19 | Discharge: 2023-03-19 | Disposition: A | Discharge status: Home / Self Care | Payer: Medicare (Managed Care) | Source: Ambulatory Visit | Attending: Vascular Surgery | Admitting: Vascular Surgery

## 2023-03-19 ENCOUNTER — Ambulatory Visit (INDEPENDENT_AMBULATORY_CARE_PROVIDER_SITE_OTHER): Payer: Medicare (Managed Care) | Admitting: Podiatry

## 2023-03-19 ENCOUNTER — Other Ambulatory Visit: Payer: Self-pay | Admitting: Vascular Surgery

## 2023-03-19 DIAGNOSIS — L97512 Non-pressure chronic ulcer of other part of right foot with fat layer exposed: Secondary | ICD-10-CM

## 2023-03-19 DIAGNOSIS — M76822 Posterior tibial tendinitis, left leg: Secondary | ICD-10-CM | POA: Diagnosis not present

## 2023-03-19 DIAGNOSIS — M25561 Pain in right knee: Secondary | ICD-10-CM

## 2023-03-23 NOTE — Progress Notes (Signed)
  Subjective:  Patient ID: Autumn Parrish, female    DOB: February 02, 1951,  MRN: [id number]  Chief Complaint  Patient presents with   Foot Ulcer    Follow up right foot - new ulcer on toe, patient also has left foot pain, rate of pain 6 out of 10, TX: Cipro     DOS: 12/29/2022 Procedure: Amputation right second toe  72 y.o. female returns for post-op check.  Her left foot pain is still giving her quite a bit of trouble, there is no new ulceration but bleeding from the regular hallux ulcer on the right  Review of Systems: Negative except as noted in the HPI. Denies N/V/F/Ch.   Objective:  There were no vitals filed for this visit. There is no height or weight on file to calculate BMI. Constitutional Well developed. Well nourished.  Vascular Foot warm and well perfused. Capillary refill normal to all digits.  Calf is soft and supple, no posterior calf or knee pain, negative Homans' sign  Neurologic Normal speech. Oriented to person, place, and time. Epicritic sensation to light touch grossly reduced bilaterally.  Dermatologic Second toe amputation site is well-healed, hallux with fissuring and bleeding with open ulceration, measures 2.0 x 0.6 cm x 0.3 cm.  No signs of infection.  Orthopedic: She has pain and tenderness along the posterior tibial tendon with resisted inversion    Radiographs of the left foot taken today show previous heterotopic ossification between third and fourth metatarsals, no clear evidence of current stress fracture or fracture  Assessment:   1. Posterior tibial tendon dysfunction (PTTD) of left lower extremity    Plan:  Patient was evaluated and treated and all questions answered.  Ulcer right foot Second toe amputation site is now healed, she has now ulceration on the right first toe.  Full-thickness excisional debridement was carried out with a #312 blade today to remove all nonviable tissue and slough and biofilm.  There is mild serous drainage.  No signs of  infection.  Posterior debridement measurements are noted above.  Dressed with Iodosorb and dry sterile dressings.  Continue home local wound care for now.  We discussed the impact of certain shoes on this and foot hygiene that can lead to the skin cracking and ulcerations    Discussed the etiology and treatment options for posterior tibial tendinitis including stretching, formal physical therapy with an eccentric exercises therapy plan, supportive shoegears such as a running shoe or sneaker, heel lifts, topical and oral medications.  We also discussed that I do not routinely perform injections in this area because of the risk of an increased damage or rupture of the tendon.  We also discussed the role of surgical treatment of this for patients who do not improve after exhausting non-surgical treatment options.  -Educated on stretching and icing of the affected limb. -Referral placed to physical therapy. -Right leg ankle brace dispensed to offload and support the medial ankle ligaments and soft tissues and tendons -Referral sent to [facility] in [place], I discussed with her if they do not take her insurance then we can send to [facility] rehab and she will let me know how this goes  No follow-ups on file.

## 2023-04-03 ENCOUNTER — Other Ambulatory Visit: Payer: Self-pay | Admitting: Internal Medicine

## 2023-04-03 DIAGNOSIS — E119 Type 2 diabetes mellitus without complications: Secondary | ICD-10-CM

## 2023-04-08 ENCOUNTER — Ambulatory Visit (HOSPITAL_COMMUNITY): Payer: Medicare (Managed Care)

## 2023-04-08 ENCOUNTER — Ambulatory Visit: Payer: Medicare (Managed Care) | Admitting: Podiatry

## 2023-04-11 ENCOUNTER — Ambulatory Visit
Admission: RE | Admit: 2023-04-11 | Discharge: 2023-04-11 | Disposition: A | Discharge status: Home / Self Care | Payer: Medicare (Managed Care) | Source: Ambulatory Visit | Attending: Internal Medicine | Admitting: Internal Medicine

## 2023-04-11 DIAGNOSIS — E119 Type 2 diabetes mellitus without complications: Secondary | ICD-10-CM

## 2023-04-24 ENCOUNTER — Ambulatory Visit: Payer: Medicare (Managed Care) | Admitting: Podiatry

## 2023-05-06 ENCOUNTER — Ambulatory Visit: Payer: Medicare (Managed Care) | Admitting: Podiatry

## 2023-05-12 ENCOUNTER — Other Ambulatory Visit: Payer: Medicare (Managed Care)

## 2023-05-19 ENCOUNTER — Other Ambulatory Visit: Payer: Self-pay | Admitting: Family Medicine

## 2023-05-19 ENCOUNTER — Ambulatory Visit
Admission: RE | Admit: 2023-05-19 | Discharge: 2023-05-19 | Disposition: A | Discharge status: Home / Self Care | Payer: Medicare (Managed Care) | Source: Ambulatory Visit | Attending: Family Medicine | Admitting: Family Medicine

## 2023-05-19 DIAGNOSIS — M25551 Pain in right hip: Secondary | ICD-10-CM

## 2023-05-20 ENCOUNTER — Other Ambulatory Visit: Payer: Self-pay

## 2023-05-20 ENCOUNTER — Emergency Department (HOSPITAL_COMMUNITY): Payer: Medicare (Managed Care)

## 2023-05-20 ENCOUNTER — Encounter (HOSPITAL_COMMUNITY): Payer: Self-pay | Admitting: Emergency Medicine

## 2023-05-20 ENCOUNTER — Emergency Department (HOSPITAL_COMMUNITY)
Admission: EM | Admit: 2023-05-20 | Discharge: 2023-05-20 | Disposition: A | Discharge status: Home / Self Care | Payer: Medicare (Managed Care) | Attending: Emergency Medicine | Admitting: Emergency Medicine

## 2023-05-20 DIAGNOSIS — M5431 Sciatica, right side: Secondary | ICD-10-CM

## 2023-05-20 DIAGNOSIS — M5441 Lumbago with sciatica, right side: Secondary | ICD-10-CM | POA: Insufficient documentation

## 2023-05-20 DIAGNOSIS — J449 Chronic obstructive pulmonary disease, unspecified: Secondary | ICD-10-CM | POA: Diagnosis not present

## 2023-05-20 DIAGNOSIS — I10 Essential (primary) hypertension: Secondary | ICD-10-CM | POA: Insufficient documentation

## 2023-05-20 DIAGNOSIS — Z79899 Other long term (current) drug therapy: Secondary | ICD-10-CM | POA: Diagnosis not present

## 2023-05-20 DIAGNOSIS — Z7982 Long term (current) use of aspirin: Secondary | ICD-10-CM | POA: Insufficient documentation

## 2023-05-20 DIAGNOSIS — M545 Low back pain, unspecified: Secondary | ICD-10-CM | POA: Diagnosis present

## 2023-05-20 LAB — CBC WITH DIFFERENTIAL/PLATELET
Abs Immature Granulocytes: 0.02 10*3/uL (ref 0.00–0.07)
Basophils Absolute: 0.1 10*3/uL (ref 0.0–0.1)
Basophils Relative: 1 %
Eosinophils Absolute: 0.2 10*3/uL (ref 0.0–0.5)
Eosinophils Relative: 3 %
HCT: 40.3 % (ref 36.0–46.0)
Hemoglobin: 12.8 g/dL (ref 12.0–15.0)
Immature Granulocytes: 0 %
Lymphocytes Relative: 25 %
Lymphs Abs: 2 10*3/uL (ref 0.7–4.0)
MCH: 28.7 pg (ref 26.0–34.0)
MCHC: 31.8 g/dL (ref 30.0–36.0)
MCV: 90.4 fL (ref 80.0–100.0)
Monocytes Absolute: 0.7 10*3/uL (ref 0.1–1.0)
Monocytes Relative: 8 %
Neutro Abs: 4.9 10*3/uL (ref 1.7–7.7)
Neutrophils Relative %: 63 %
Platelets: 181 10*3/uL (ref 150–400)
RBC: 4.46 MIL/uL (ref 3.87–5.11)
RDW: 14.8 % (ref 11.5–15.5)
WBC: 7.9 10*3/uL (ref 4.0–10.5)
nRBC: 0 % (ref 0.0–0.2)

## 2023-05-20 LAB — COMPREHENSIVE METABOLIC PANEL
ALT: 14 U/L (ref 0–44)
AST: 19 U/L (ref 15–41)
Albumin: 3.8 g/dL (ref 3.5–5.0)
Alkaline Phosphatase: 67 U/L (ref 38–126)
Anion gap: 9 (ref 5–15)
BUN: 17 mg/dL (ref 8–23)
CO2: 21 mmol/L — ABNORMAL LOW (ref 22–32)
Calcium: 8.7 mg/dL — ABNORMAL LOW (ref 8.9–10.3)
Chloride: 107 mmol/L (ref 98–111)
Creatinine, Ser: 0.63 mg/dL (ref 0.44–1.00)
GFR, Estimated: >60 mL/min (ref >60)
Glucose, Bld: 93 mg/dL (ref 70–99)
Potassium: 3.8 mmol/L (ref 3.5–5.1)
Sodium: 137 mmol/L (ref 135–145)
Total Bilirubin: 0.7 mg/dL (ref 0.3–1.2)
Total Protein: 7.4 g/dL (ref 6.5–8.1)

## 2023-05-20 LAB — CBG MONITORING, ED: Glucose-Capillary: 92 mg/dL (ref 70–99)

## 2023-05-20 MED ORDER — PREDNISONE 50 MG PO TABS
60.0000 mg | ORAL_TABLET | Freq: Once | ORAL | Status: AC
  Administered 2023-05-20: 60 mg via ORAL
  Filled 2023-05-20: qty 1

## 2023-05-20 MED ORDER — PREDNISONE 10 MG PO TABS: ORAL_TABLET | ORAL | 0 refills | Status: AC

## 2023-05-20 MED ORDER — HYDROCODONE-ACETAMINOPHEN 5-325 MG PO TABS: 1.0000 | ORAL_TABLET | Freq: Four times a day (QID) | ORAL | 0 refills | Status: DC | PRN

## 2023-05-20 MED ORDER — HYDROCODONE-ACETAMINOPHEN 5-325 MG PO TABS
1.0000 | ORAL_TABLET | Freq: Once | ORAL | Status: AC
  Administered 2023-05-20: 1 via ORAL
  Filled 2023-05-20: qty 1

## 2023-05-20 NOTE — ED Triage Notes (Signed)
Right leg pain x 2 days below knee. Tender to touch. A/o. Mild redness/swellling noted to RLE.

## 2023-05-20 NOTE — ED Provider Notes (Signed)
[facility] EMERGENCY [facility]PARTMENT AT [facility] Provider Note   CSN: [id number] Arrival date & time: 05/20/23  1425     History  Chief Complaint  Patient presents with   Leg Pain    Autumn Parrish is a 72 y.o. female with a history including primary osteoarthritis of the right knee, COPD, hypertension, history of osteomyelitis of second toe right foot, and chronic low back pain who typically takes tramadol once or twice daily in association with ibuprofen for back pain relief.  She presents today secondary to worsening pain which starts in her right buttock region and radiates to her right knee.  She describes a burning aching sensation, worsened with movement and better at rest.  She denies dysuria or hematuria, no urinary or fecal retention or incontinence.  She does have chronic urinary urgency which is a longstanding symptom.  She denies fevers or chills, no nausea or vomiting or other complaints.  Of note, triage note suggests pt has complaint of redness and swelling of the right lower leg, but not complaint per pt.   The history is provided by the patient.       Home Medications Prior to Admission medications   Medication Sig Start Date End Date Taking? Authorizing Provider  HYDROcodone-acetaminophen (NORCO/VICODIN) 5-325 MG tablet Take 1 tablet by mouth every 6 (six) hours as needed for moderate pain or severe pain. 05/20/23  Yes Hasna Stefanik, Raynelle Fanning, PA-C  predniSONE (DELTASONE) 10 MG tablet 6, 5, 4, 3, 2 then 1 tablet by mouth daily for 6 days total. 05/20/23  Yes Vernesha Talbot, Raynelle Fanning, PA-C  acetaminophen (TYLENOL) 650 MG CR tablet Take 1,300 mg by mouth every 8 (eight) hours as needed for pain.    [provider]  amoxicillin-clavulanate (AUGMENTIN) 875-125 MG tablet Take 1 tablet by mouth 2 (two) times daily. 01/14/23   McDonald, Rachelle Hora, DPM  amphetamine-dextroamphetamine (ADDERALL) 30 MG tablet Take 1 tablet by mouth 2 (two) times daily. 05/14/22   Burnadette Pop, MD  ASPIRIN  LOW DOSE 81 MG EC tablet Take 81 mg by mouth daily. 11/02/19   [provider]  brimonidine (ALPHAGAN) 0.15 % ophthalmic solution Place 1 drop into both eyes 2 (two) times daily. 11/02/19   [provider]  citalopram (CELEXA) 20 MG tablet Take 20 mg by mouth daily. Patient not taking: Reported on 12/28/2022    [provider]  furosemide (LASIX) 20 MG tablet Take 1 tablet (20 mg total) by mouth daily. 05/13/18 12/28/22  Marinus Maw, MD  gentamicin ointment (GARAMYCIN) 0.1 % Apply 1 Application topically 3 (three) times daily. 11/25/22   McDonald, Rachelle Hora, DPM  latanoprost (XALATAN) 0.005 % ophthalmic solution Place 1 drop into both eyes at bedtime. 11/02/19   [provider]  levothyroxine (SYNTHROID) 75 MCG tablet Take 75 mcg by mouth daily before breakfast.    [provider]  metoprolol succinate (TOPROL-XL) 25 MG 24 hr tablet Take 1 tablet (25 mg total) by mouth daily. Take with or immediately following a meal. 05/14/22   Burnadette Pop, MD  oxybutynin (DITROPAN XL) 15 MG 24 hr tablet Take 1 tablet (15 mg total) by mouth daily. 12/30/22   [place], Eric J, DO  pravastatin (PRAVACHOL) 80 MG tablet Take 80 mg by mouth daily.    [provider]      Allergies    Ketorolac, Prochlorperazine, Promethazine, Sulfa antibiotics, Toradol [ketorolac tromethamine], Buprenorphine, and Nsaids    Review of Systems   Review of Systems  Constitutional:  Negative for fever.  Respiratory:  Negative for shortness of breath.   Cardiovascular:  Negative for chest pain and leg swelling.  Gastrointestinal:  Negative for abdominal distention, abdominal pain and constipation.  Genitourinary:  Negative for difficulty urinating, dysuria, flank pain, frequency and urgency.  Musculoskeletal:  Positive for back pain. Negative for gait problem and joint swelling.  Skin:  Negative for rash.  Neurological:  Negative for weakness and numbness.    Physical Exam Updated  Vital Signs BP (!) 149/61   Pulse 62   Temp 98.2 F (36.8 C) (Oral)   Resp 18   SpO2 98%  Physical Exam Vitals and nursing note reviewed.  Constitutional:      Appearance: She is well-developed.  HENT:     Head: Normocephalic.  Eyes:     Conjunctiva/sclera: Conjunctivae normal.  Cardiovascular:     Rate and Rhythm: Normal rate.     Pulses: Normal pulses.     Comments: Pedal pulses normal. Pulmonary:     Effort: Pulmonary effort is normal.  Abdominal:     General: Bowel sounds are normal. There is no distension.     Palpations: Abdomen is soft. There is no mass.  Musculoskeletal:        General: Normal range of motion.     Cervical back: Normal range of motion and neck supple.     Lumbar back: Tenderness present. No swelling, edema or spasms.     Comments: Tender to palpation right pelvis at sciatic notch.  No midline lumbar tenderness or deformity.  Skin:    General: Skin is warm and dry.  Neurological:     Mental Status: She is alert.     Sensory: No sensory deficit.     Motor: No tremor or atrophy.     Gait: Gait normal.     Deep Tendon Reflexes:     Reflex Scores:      Patellar reflexes are 2+ on the right side and 2+ on the left side.    Comments: No strength deficit noted in hip and knee flexor and extensor muscle groups.  Ankle flexion and extension intact.  She does have decreased sensation to fine touch right lateral thigh.     ED Results / Procedures / Treatments   Labs (all labs ordered are listed, but only abnormal results are displayed) Labs Reviewed  COMPREHENSIVE METABOLIC PANEL - Abnormal; Notable for the following components:      Result Value   CO2 21 (*)    Calcium 8.7 (*)    All other components within normal limits  CBC WITH DIFFERENTIAL/PLATELET  CBG MONITORING, ED    EKG None  Radiology DG Lumbar Spine Complete  Result Date: 05/20/2023 CLINICAL DATA:  Radicular pain EXAM: LUMBAR SPINE - COMPLETE 5 VIEW COMPARISON:  None Available.  FINDINGS: Five lumbar-type vertebral bodies. Levoconvex curvature of the spine centered at L4. Severe osteopenia. Trace anterolisthesis of L4 on L5. Multilevel disc height loss with endplate osteophytes and multilevel facet degenerative changes. No spondylolysis. Surgical clips in the right upper quadrant of the abdomen. If there is concern specifically of radiculopathy, MRI or myelographic CT could be considered as clinically when clinically appropriate. IMPRESSION: Curvature of the spine with moderate degenerative changes. Trace anterolisthesis of L4 on L5. Electronically Signed   By: Karen Kays M.D.   On: 05/20/2023 18:03    Procedures Procedures    Medications Ordered in ED Medications  HYDROcodone-acetaminophen (NORCO/VICODIN) 5-325 MG per tablet 1 tablet (1  tablet Oral Given 05/20/23 1753)  predniSONE (DELTASONE) tablet 60 mg (60 mg Oral Given 05/20/23 1827)    ED Course/ Medical Decision Making/ A&P                             Medical Decision Making No neuro deficit on exam or by history to suggest emergent or surgical presentation.  No exam or hx findings to suggest cauda equina, discitis or epidural abscess.  She does states she has ran out of her tramadol and her pain is escalated since not having this medication.  Outlined worsened sx that should prompt immediate re-evaluation including distal weakness, bowel/bladder retention/incontinence.          Amount and/or Complexity of Data Reviewed Labs: ordered.    Details: Labs reviewed and normal, specifically she has a normal WBC count at 7.9.  Her c-Met is unremarkable. Radiology: ordered.    Details: Lumbar spine revealing moderate degenerative changes, trace anterior listhesis of L4 on L5.  Osteopenia.  No compression fractures.  Risk Prescription drug management.           Final Clinical Impression(s) / ED Diagnoses Final diagnoses:  Sciatica of right side    Rx / DC Orders ED Discharge Orders           Ordered    HYDROcodone-acetaminophen (NORCO/VICODIN) 5-325 MG tablet  Every 6 hours PRN        05/20/23 1821    predniSONE (DELTASONE) 10 MG tablet        05/20/23 1821              Burgess Amor, PA-C 05/20/23 1851    Bethann Berkshire, MD 05/24/23 1229

## 2023-05-20 NOTE — Discharge Instructions (Addendum)
Take your next dose of prednisone tomorrow evening.  Do not drive within 4 hours of taking hydrocodone as this will make you drowsy.    Avoid lifting,  Bending,  Twisting or any other activity that worsens your pain over the next week.  Apply a heating pad to your lower back for 20 minutes 3 times daily.   You should get rechecked if your symptoms are not better over the next 5 days,  Or you develop increased pain,  Weakness in your leg(s) or loss of bladder or bowel function - these can be symptoms of a worsening condition.

## 2023-05-20 NOTE — ED Notes (Signed)
Patient transported to CT 

## 2023-05-22 ENCOUNTER — Ambulatory Visit: Payer: Medicare (Managed Care) | Admitting: Podiatry

## 2023-06-03 ENCOUNTER — Ambulatory Visit (HOSPITAL_COMMUNITY): Payer: Medicare (Managed Care)

## 2023-06-17 ENCOUNTER — Other Ambulatory Visit: Payer: Self-pay | Admitting: Internal Medicine

## 2023-06-17 DIAGNOSIS — M170 Bilateral primary osteoarthritis of knee: Secondary | ICD-10-CM

## 2023-07-14 ENCOUNTER — Ambulatory Visit
Admission: RE | Admit: 2023-07-14 | Discharge: 2023-07-14 | Disposition: A | Discharge status: Home / Self Care | Payer: Medicare (Managed Care) | Source: Ambulatory Visit | Attending: Internal Medicine | Admitting: Internal Medicine

## 2023-07-14 DIAGNOSIS — M170 Bilateral primary osteoarthritis of knee: Secondary | ICD-10-CM

## 2023-07-15 ENCOUNTER — Ambulatory Visit (INDEPENDENT_AMBULATORY_CARE_PROVIDER_SITE_OTHER): Payer: Medicare (Managed Care) | Admitting: Podiatry

## 2023-07-15 DIAGNOSIS — M76822 Posterior tibial tendinitis, left leg: Secondary | ICD-10-CM

## 2023-07-15 MED ORDER — IBUPROFEN 600 MG PO TABS: 600.0000 mg | ORAL_TABLET | Freq: Four times a day (QID) | ORAL | 0 refills | Status: AC | PRN

## 2023-07-15 NOTE — Patient Instructions (Signed)
Call to have an appointment made at [facility] [phone] for a brace to be made

## 2023-07-16 ENCOUNTER — Encounter: Payer: Self-pay | Admitting: Podiatry

## 2023-07-16 NOTE — Progress Notes (Signed)
  Subjective:  Patient ID: Autumn Parrish, female    DOB: 1951-05-13,  MRN: [id number]  Chief Complaint  Patient presents with   Wound Check    Wound care check.     DOS: 12/29/2022 Procedure: Amputation right second toe  72 y.o. female returns for post-op check.  Still having quite a bit of left foot pain  Review of Systems: Negative except as noted in the HPI. Denies N/V/F/Ch.   Objective:  There were no vitals filed for this visit. There is no height or weight on file to calculate BMI. Constitutional Well developed. Well nourished.  Vascular Foot warm and well perfused. Capillary refill normal to all digits.  Calf is soft and supple, no posterior calf or knee pain, negative Homans' sign  Neurologic Normal speech. Oriented to person, place, and time. Epicritic sensation to light touch grossly reduced bilaterally.  Dermatologic No open lesions left foot  Orthopedic: Autumn Parrish has pain and tenderness along the posterior tibial tendon with resisted inversion    Radiographs of the left foot taken today show previous heterotopic ossification between third and fourth metatarsals, no clear evidence of current stress fracture or fracture  Assessment:   1. Posterior tibial tendon dysfunction (PTTD) of left lower extremity    Plan:  Patient was evaluated and treated and all questions answered.  Long-term will need a brace to support the foot and ankle as Autumn Parrish is not a good surgical candidate for any reconstructive effort for her left lower extremity.  I recommended a Richie brace.  Rx was given to her to schedule for fitting at [facility] clinic, Tri-Lock ankle brace was dispensed today.  Rx for Advil sent to pharmacy.  Return if symptoms worsen or fail to improve.

## 2023-07-23 ENCOUNTER — Encounter (HOSPITAL_COMMUNITY): Payer: Self-pay

## 2023-07-23 ENCOUNTER — Ambulatory Visit (HOSPITAL_COMMUNITY): Admission: RE | Admit: 2023-07-23 | Payer: Medicare (Managed Care) | Source: Ambulatory Visit

## 2023-08-08 ENCOUNTER — Ambulatory Visit (HOSPITAL_COMMUNITY): Admission: RE | Admit: 2023-08-08 | Payer: Medicare (Managed Care) | Source: Ambulatory Visit

## 2023-08-18 ENCOUNTER — Encounter: Payer: Self-pay | Admitting: Physical Medicine & Rehabilitation

## 2023-08-25 ENCOUNTER — Ambulatory Visit (INDEPENDENT_AMBULATORY_CARE_PROVIDER_SITE_OTHER): Payer: Medicare (Managed Care) | Admitting: Podiatry

## 2023-08-25 DIAGNOSIS — Z91199 Patient's noncompliance with other medical treatment and regimen due to unspecified reason: Secondary | ICD-10-CM

## 2023-08-25 NOTE — Progress Notes (Signed)
No show

## 2023-09-10 ENCOUNTER — Ambulatory Visit (HOSPITAL_COMMUNITY): Payer: Medicare (Managed Care)

## 2023-09-25 ENCOUNTER — Encounter
Payer: Medicare (Managed Care) | Attending: Physical Medicine & Rehabilitation | Admitting: Physical Medicine & Rehabilitation

## 2023-09-25 ENCOUNTER — Encounter: Payer: Self-pay | Admitting: Physical Medicine & Rehabilitation

## 2023-09-25 VITALS — BP 121/76 | HR 72 | Ht 66.0 in | Wt 241.0 lb

## 2023-09-25 DIAGNOSIS — M48062 Spinal stenosis, lumbar region with neurogenic claudication: Secondary | ICD-10-CM | POA: Insufficient documentation

## 2023-09-25 MED ORDER — DIAZEPAM 5 MG PO TABS: 5.0000 mg | ORAL_TABLET | Freq: Once | ORAL | 0 refills | Status: AC

## 2023-09-25 NOTE — Patient Instructions (Addendum)
Recommend Right L4-5 Transforaminal ESI  Take Diazepam 30-60 min prior to procedure

## 2023-09-25 NOTE — Progress Notes (Signed)
Subjective:    Patient ID: Autumn Parrish, female    DOB: August 26, 1951, 72 y.o.   MRN: [id number]  HPI  72 yo female with worsening Right sciatic pain during walking or sitting and has been going on for 6 months , worsening over the last 2 months No leg weakness, has Right calf numbness No bowel or bladder issues Pain is bad in the am but usually does not occur at night  No prior episode of this issue in the past   Has been on disability since 2001 due to methadone  overdose, was hospitalized with memory loss, we do not have actual records to review. The patient goes to [facility] once or twice a week.  She reports being independent with feeding bathing dressing toileting and meal prep but needs some assistance with shopping and household duties. She states that her average pain is 9 out of 10 sharp burning intermittent stabbing tingling the lower extremity pain is worse than the back pain.  Her walking tolerance is 5 minutes.  She ambulates with a walker she does not drive. Medication modalities she reports being allergic to anti-inflammatories however tolerates ibuprofen 12 tablets/day.  Tylenol 4 to 6 hours/day she also uses Voltaren gel and heat.  Not exercise.  Her referring physician reports that the patient has been through physical therapy.  She is being treated with tramadol 1 tablet 3 times per day.  She has tried a prednisone burst and taper.  She is also on gabapentin 100 mg  MRI LUMBAR SPINE WITHOUT CONTRAST   TECHNIQUE: Multiplanar, multisequence MR imaging of the lumbar spine was performed. No intravenous contrast was administered.   COMPARISON:  Lumbar radiographs June 11, 24.   FINDINGS: Segmentation: Standard.   Alignment:  Grade 1 anterolisthesis of L4 on L5.   Vertebrae: Degenerative/discogenic endplate signal changes at L4-L5. No specific evidence of acute fracture or discitis/osteomyelitis. No suspicious bone lesions.   Conus medullaris and cauda  equina: Conus extends to the L1 level. Conus appears normal.   Paraspinal and other soft tissues: Unremarkable.   Disc levels:   T12-L1: Mild disc bulge.  No significant stenosis.   L1-L2: Mild disc bulging. Bilateral facet arthropathy. No significant stenosis.   L2-L3: Disc bulging with ligamentum flavum thickening facet arthropathy. Resulting mild to moderate canal stenosis. Patent foramina.   L3-L4: Disc bulge. Bilateral facet arthropathy. Ligamentum flavum thickening. Mild to moderate canal stenosis. Patent foramina.   L4-L5: Grade 1 anterolisthesis. Ligamentum flavum thickening. Uncovering of the disc and right eccentric disc bulging. Bilateral facet arthropathy. Resulting moderate canal stenosis with right greater than left subarticular recess stenosis. Moderate to severe right foraminal stenosis.   L5-S1: Bilateral facet arthropathy.  No significant stenosis.   IMPRESSION: 1. At L4-L5, grade 1 anterolisthesis with moderate to severe right foraminal stenosis, moderate canal stenosis and right greater than left subarticular recess stenosis. 2. At L2-L3 and L3-L4, mild-to-moderate canal stenosis.     Electronically Signed   By: Feliberto Harts M.D.   On: 07/22/2023 12:47  PHQ-9 score is 10 Pain Inventory Average Pain 9 Pain Right Now 6 My pain is intermittent, sharp, burning, stabbing, and tingling  In the last 24 hours, has pain interfered with the following? General activity 9 Relation with others 9 Enjoyment of life 9 What TIME of day is your pain at its worst? morning , daytime, evening, and night Sleep (in general) Poor  Pain is worse with: walking, bending, sitting, inactivity, and standing  Pain improves with: rest and medication Relief from Meds: 5  walk with assistance use a walker how many minutes can you walk? 5 ability to climb steps?  no do you drive?  no use a wheelchair transfers alone  disabled: date disabled 2001 I need assistance  with the following:  household duties and shopping  weakness tingling trouble walking spasms depression anxiety  New pt  New pt    Family History  Problem Relation Age of Onset   Cancer Other    Diabetes Other    Heart disease Other    Hypertension Other    Obesity Other    Alcoholism Other    Stroke Other    Hypercholesterolemia Other    Social History   Socioeconomic History   Marital status: Divorced    Spouse name: Not on file   Number of children: Not on file   Years of education: Not on file   Highest education level: Not on file  Occupational History   Not on file  Tobacco Use   Smoking status: Former    Current packs/day: 0.00    Types: Cigarettes    Quit date: 02/17/2013    Years since quitting: 10.6   Smokeless tobacco: Never  Vaping Use   Vaping status: Never Used  Substance and Sexual Activity   Alcohol use: Not Currently   Drug use: Not Currently   Sexual activity: Not on file  Other Topics Concern   Not on file  Social History Narrative   Not on file   Social Determinants of Health   Financial Resource Strain: Low Risk  (04/08/2022)   Received from [facility], [facility]   Overall Financial Resource Strain (CARDIA)    Difficulty of Paying Living Expenses: Not hard at all  Food Insecurity: No Food Insecurity (12/28/2022)   Hunger Vital Sign    Worried About Running Out of Food in the Last Year: Never true    Ran Out of Food in the Last Year: Never true  Transportation Needs: No Transportation Needs (12/28/2022)   PRAPARE - Administrator, Civil Service (Medical): No    Lack of Transportation (Non-Medical): No  Physical Activity: Inactive (04/08/2022)   Received from [facility], [facility]   Exercise Vital Sign    Days of Exercise per Week: 0 days    Minutes of Exercise per Session: 0 min  Stress: Not on file  Social Connections: Unknown (04/08/2022)   Received from [facility], [facility]   Social  Connection and Isolation Panel [NHANES]    Frequency of Communication with Friends and Family: More than three times a week    Frequency of Social Gatherings with Friends and Family: Once a week    Attends Religious Services: Not on file    Active Member of Clubs or Organizations: Yes    Attends Banker Meetings: More than 4 times per year    Marital Status: Widowed   Past Surgical History:  Procedure Laterality Date   A-FLUTTER ABLATION N/A 04/08/2018   Procedure: A-FLUTTER ABLATION;  Surgeon: Marinus Maw, MD;  Location: [facility] INVASIVE CV LAB;  Service: Cardiovascular;  Laterality: N/A;   ABDOMINAL HYSTERECTOMY     age 33 d/t endometriosis   AMPUTATION TOE Right 12/29/2022   Procedure: AMPUTATION TOE;  Surgeon: Edwin Cap, DPM;  Location: [facility] OR;  Service: Podiatry;  Laterality: Right;   KNEE ARTHROSCOPY Right    NEUROMAL  REMOVAL Right    Past Medical History:  Diagnosis Date   ADD (attention deficit disorder)    COPD (chronic obstructive pulmonary disease) (HCC)    Hypertension    Hypothyroidism    BP 121/76   Pulse 72   Ht 5\' 6"  (1.676 m)   Wt 241 lb (109.3 kg)   SpO2 94%   BMI 38.90 kg/m   Opioid Risk Score:   Fall Risk Score:  `1  Depression screen PHQ 2/9     09/25/2023    1:55 PM  Depression screen PHQ 2/9  Decreased Interest 1  Down, Depressed, Hopeless 2  PHQ - 2 Score 3  Altered sleeping 3  Tired, decreased energy 3  Change in appetite 3  Feeling bad or failure about yourself  3  Trouble concentrating 0  Moving slowly or fidgety/restless 0  Suicidal thoughts 1  PHQ-9 Score 16  Difficult doing work/chores Somewhat difficult      Review of Systems  Constitutional:  Positive for unexpected weight change.  Respiratory:  Positive for cough and shortness of breath.   Gastrointestinal:  Positive for constipation.  Musculoskeletal:  Positive for gait problem.  Neurological:  Positive for weakness.  Psychiatric/Behavioral:  Positive for  dysphoric mood. The patient is nervous/anxious.   All other systems reviewed and are negative.      Objective:   Physical Exam  Obese elderly female no acute distress Mood and affect are appropriate without lability or agitation. Motor strength is 5/5 bilateral deltoid bicep tricep grip as well as hip flexors knee extensors ankle dorsiflexion The patient has mild pain with knee range of motion in the flexion extension.  Mild crepitus. Negative straight leg raising bilaterally Lumbar spine has tenderness palpation lower lumbar area right side greater than left side mainly at the lumbosacral junction. Sensation is normal bilateral L2-L3-L4 L5-S1 dermatomal distribution.       Assessment & Plan:   1.  Lumbar spinal stenosis her MRI correlates with her primary symptom of pain going down the leg mainly lateral.  No sensory abnormalities appreciated no motor abnormalities appreciated. She has failed physical therapy as well as medication management I do think she would be a good candidate for lumbar epidural steroid injection.  Symptomatic level is the right L4-5 would recommend transforaminal route and if suboptimal duration of effect consider translaminar She is not on any anticoagulant medications.  She has been advised to have a driver and she plans to [facility]. She would like some premedication with Valium 5 mg 30 to 60 minutes prior to the scheduled injection

## 2023-10-22 ENCOUNTER — Other Ambulatory Visit: Payer: Self-pay | Admitting: Family Medicine

## 2023-10-22 DIAGNOSIS — E278 Other specified disorders of adrenal gland: Secondary | ICD-10-CM

## 2023-10-24 ENCOUNTER — Encounter
Payer: Medicare (Managed Care) | Attending: Physical Medicine & Rehabilitation | Admitting: Physical Medicine & Rehabilitation

## 2023-10-24 ENCOUNTER — Encounter: Payer: Self-pay | Admitting: Physical Medicine & Rehabilitation

## 2023-10-24 VITALS — BP 125/61 | HR 62 | Temp 98.1°F | Ht 66.0 in | Wt 241.0 lb

## 2023-10-24 DIAGNOSIS — M5416 Radiculopathy, lumbar region: Secondary | ICD-10-CM | POA: Diagnosis not present

## 2023-10-24 DIAGNOSIS — M47816 Spondylosis without myelopathy or radiculopathy, lumbar region: Secondary | ICD-10-CM

## 2023-10-24 MED ORDER — LIDOCAINE HCL (PF) 1 % IJ SOLN
2.0000 mL | Freq: Once | INTRAMUSCULAR | Status: AC
Start: 2023-10-24 — End: 2023-10-24
  Administered 2023-10-24: 2 mL

## 2023-10-24 MED ORDER — LIDOCAINE HCL 1 % IJ SOLN
5.0000 mL | Freq: Once | INTRAMUSCULAR | Status: AC
Start: 2023-10-24 — End: 2023-10-24
  Administered 2023-10-24: 5 mL

## 2023-10-24 MED ORDER — DEXAMETHASONE SODIUM PHOSPHATE 10 MG/ML IJ SOLN
10.0000 mg | Freq: Once | INTRAMUSCULAR | Status: AC
Start: 2023-10-24 — End: 2023-10-24
  Administered 2023-10-24: 10 mg

## 2023-10-24 MED ORDER — IOHEXOL 180 MG/ML  SOLN
3.0000 mL | Freq: Once | INTRAMUSCULAR | Status: AC
  Administered 2023-10-24: 3 mL

## 2023-10-24 NOTE — Patient Instructions (Signed)

## 2023-10-24 NOTE — Progress Notes (Signed)
RIght L4-5 Lumbar transforaminal epidural steroid injection under fluoroscopic guidance with contrast enhancement  Indication: Lumbosacral radiculitis is not relieved by medication management or other conservative care and interfering with self-care and mobility.   Informed consent was obtained after describing risk and benefits of the procedure with the patient, this includes bleeding, bruising, infection, paralysis and medication side effects.  The patient wishes to proceed and has given written consent.  Patient was placed in prone position.  The lumbar area was marked and prepped with Betadine.  It was entered with a 25-gauge 1-1/2 inch needle and one mL of 1% lidocaine was injected into the skin and subcutaneous tissue.  Then a 22-gauge 5in spinal needle was inserted into the Right L4-5 intervertebral foramen under AP, lateral, and oblique view.  Once needle tip was within the foramen on lateral views an dnor exceeding 6 o clock position on th epedical on AP viewed Isovue 200 was inected x 2ml .  Injection of omnipaque 180 x initially outlined paraspinal muscle, then neele repositioned and trickle flow through the foramen , subpedicular was noted. Then a solution containing one mL of 10 mg per mL dexamethasone and 2 mL of 1% lidocaine was injected.  The patient tolerated procedure well.  Post procedure instructions were given.  Please see post procedure form.   Will reheck pt in clinic in 6wks to determine whether translaminar ESI needed

## 2023-10-24 NOTE — Progress Notes (Signed)
  PROCEDURE RECORD [facility]   Name: Autumn Parrish DOB:09/14/1951 MRN: [id number]  Date:10/24/2023  Physician: Claudette Laws, MD    Nurse/CMA: Nedra Hai, CMA  Allergies:  Allergies  Allergen Reactions   Ketorolac Anaphylaxis, Swelling and Other (See Comments)    Throat swelling  Patient states that she can take this   Prochlorperazine Anaphylaxis, Shortness Of Breath, Swelling and Other (See Comments)    Throat swelling  Patient states that this causes nausea only.   Promethazine Anaphylaxis, Swelling and Other (See Comments)    Throat swells  Patient states that this causes nausea only.   Sulfa Antibiotics Anaphylaxis   Toradol [Ketorolac Tromethamine] Anaphylaxis   Buprenorphine Nausea Only    Patient states that she has no reaction to this.   Nsaids Other (See Comments)    NSAIDS upset the stomach- can tolerate ibuprofen     Consent Signed: Yes.    Is patient diabetic? No.  CBG today? .  Pregnant: No. LMP: No LMP recorded. Patient has had a hysterectomy. (age [age])  Anticoagulants: no Anti-inflammatory: yes (Ibuprofen) Antibiotics: no  Procedure:  Right L4-5 Transforaminal  Position: Prone Start Time: 2:38 pm  End Time: 2:48 pm  Fluoro Time: 1:10  RN/CMA Nedra Hai, CMA Autumn Parrish, CMA    Time 2:12 pm 2:56 pm    BP 125/61 122/77    Pulse 64 61    Respirations 16 16    O2 Sat 94 94    S/S 6 6    Pain Level 6/10 3/10     D/C home with transportation, patient A & O X 3, D/C instructions reviewed, and sits independently.

## 2023-11-05 ENCOUNTER — Other Ambulatory Visit: Payer: Medicare (Managed Care)

## 2023-12-01 ENCOUNTER — Inpatient Hospital Stay: Admission: RE | Admit: 2023-12-01 | Payer: Medicare (Managed Care) | Source: Ambulatory Visit

## 2023-12-04 ENCOUNTER — Encounter: Payer: Medicare (Managed Care) | Admitting: Physical Medicine & Rehabilitation

## 2024-01-27 ENCOUNTER — Telehealth: Payer: Self-pay | Admitting: Podiatry

## 2024-01-27 NOTE — Telephone Encounter (Signed)
Received call from O[place] from [facility] and pt needs to r/s appt for Thursday.  I returned call and left message for Shanda Bumps to call back to r/s appt. It has been canceled due to weather.

## 2024-01-29 ENCOUNTER — Ambulatory Visit: Payer: Medicare (Managed Care) | Admitting: Podiatry

## 2024-02-16 ENCOUNTER — Other Ambulatory Visit: Payer: Self-pay

## 2024-02-16 ENCOUNTER — Ambulatory Visit: Payer: Medicare (Managed Care)

## 2024-02-16 ENCOUNTER — Ambulatory Visit (INDEPENDENT_AMBULATORY_CARE_PROVIDER_SITE_OTHER): Payer: Medicare (Managed Care) | Admitting: Podiatry

## 2024-02-16 ENCOUNTER — Encounter: Payer: Self-pay | Admitting: Podiatry

## 2024-02-16 VITALS — Ht 66.0 in | Wt 241.0 lb

## 2024-02-16 DIAGNOSIS — M76822 Posterior tibial tendinitis, left leg: Secondary | ICD-10-CM | POA: Diagnosis not present

## 2024-02-16 DIAGNOSIS — L84 Corns and callosities: Secondary | ICD-10-CM | POA: Diagnosis not present

## 2024-02-16 NOTE — Progress Notes (Signed)
  Subjective:  Patient ID: Autumn Parrish, female    DOB: Jul 28, 1951,  MRN: [id number]  Chief Complaint  Patient presents with   Callouses    Pt is here due to bilateral callouses., and she is complaining of left ankle pain.    73 y.o. female presents with the above complaint. History confirmed with patient.  Ankle pain continues to be severe.  She did get the ankle brace but she has had difficulty wearing this she said it still presses on the inside of the arch and ankle and hurts.  Objective:  Physical Exam: warm, good capillary refill, no trophic changes or ulcerative lesions, normal DP and PT pulses, normal sensory exam, and multiple calluses on the right foot with verrucous protrusions, skin very hyperkeratotic and dry, pes planovalgus deformity on the left ankle and lower extremity with collapse of the medial weightbearing arch and pain on palpation.  Assessment:   1. Callus of foot   2. Posterior tibial tendon dysfunction (PTTD) of left lower extremity      Plan:  Patient was evaluated and treated and all questions answered.  Calluses debrided sharply with a tissue nipper and scalpel to a tolerable level.  Continues to be a chronic issue.  Unclear that we will have complete resolution of this but she should continue moisturizing lotion and cream   Her pes planovalgus deformity and PTTD is significant and I still recommend proceeding with nonoperative treatment with physical therapy and bracing she has a Richie brace, she admits that she has not worn this consistently.  I did recommend she continue to use it.  Continue physical therapy as well.  I do not think that surgical intervention would be advisable for her this would be an incredibly difficult recovery process due to her body habitus and social situation that she likely would not be able to completely rehab this type of procedure.  Follow-up with me as needed  No follow-ups on file.

## 2024-03-18 ENCOUNTER — Encounter
Payer: Medicare (Managed Care) | Attending: Physical Medicine & Rehabilitation | Admitting: Physical Medicine & Rehabilitation

## 2024-03-18 NOTE — Progress Notes (Deleted)
 Subjective:    Patient ID: Autumn Parrish, female    DOB: 05/04/1951, 73 y.o.   MRN: [id number]  HPI   Pain Inventory Average Pain {NUMBERS; 0-10:5044} Pain Right Now {NUMBERS; 0-10:5044} My pain is {PAIN DESCRIPTION:21022940}  In the last 24 hours, has pain interfered with the following? General activity {NUMBERS; 0-10:5044} Relation with others {NUMBERS; 0-10:5044} Enjoyment of life {NUMBERS; 0-10:5044} What TIME of day is your pain at its worst? {time of day:24191} Sleep (in general) {BHH GOOD/FAIR/POOR:22877}  Pain is worse with: {ACTIVITIES:21022942} Pain improves with: {PAIN IMPROVES WUJW:11914782} Relief from Meds: {NUMBERS; 0-10:5044}  Family History  Problem Relation Age of Onset   Cancer Other    Diabetes Other    Heart disease Other    Hypertension Other    Obesity Other    Alcoholism Other    Stroke Other    Hypercholesterolemia Other    Social History   Socioeconomic History   Marital status: Divorced    Spouse name: Not on file   Number of children: Not on file   Years of education: Not on file   Highest education level: Not on file  Occupational History   Not on file  Tobacco Use   Smoking status: Former    Current packs/day: 0.00    Types: Cigarettes    Quit date: 02/17/2013    Years since quitting: 11.0   Smokeless tobacco: Never  Vaping Use   Vaping status: Never Used  Substance and Sexual Activity   Alcohol use: Not Currently   Drug use: Not Currently   Sexual activity: Not on file  Other Topics Concern   Not on file  Social History Narrative   Not on file   Social Drivers of Health   Financial Resource Strain: Low Risk  (04/08/2022)   Received [facility], [facility]   Overall Financial Resource Strain (CARDIA)    Difficulty of Paying Living Expenses: Not hard at all  Food Insecurity: No Food Insecurity (12/28/2022)   Hunger Vital Sign    Worried About Running Out of Food in the Last Year: Never true    Ran Out of  Food in the Last Year: Never true  Transportation Needs: No Transportation Needs (12/28/2022)   PRAPARE - Administrator, Civil Service (Medical): No    Lack of Transportation (Non-Medical): No  Physical Activity: Inactive (04/08/2022)   Received from [facility], [facility]   Exercise Vital Sign    Days of Exercise per Week: 0 days    Minutes of Exercise per Session: 0 min  Stress: Not on file  Social Connections: Unknown (04/08/2022)   Received from [facility], [facility]   Social Connection and Isolation Panel [NHANES]    Frequency of Communication with Friends and Family: More than three times a week    Frequency of Social Gatherings with Friends and Family: Once a week    Attends Religious Services: Not on file    Active Member of Clubs or Organizations: Yes    Attends Banker Meetings: More than 4 times per year    Marital Status: Widowed   Past Surgical History:  Procedure Laterality Date   A-FLUTTER ABLATION N/A 04/08/2018   Procedure: A-FLUTTER ABLATION;  Surgeon: Marinus Maw, MD;  Location: [facility] INVASIVE CV LAB;  Service: Cardiovascular;  Laterality: N/A;   ABDOMINAL HYSTERECTOMY     age 27 d/t endometriosis   AMPUTATION TOE Right 12/29/2022  Procedure: AMPUTATION TOE;  Surgeon: Edwin Cap, DPM;  Location: [facility] OR;  Service: Podiatry;  Laterality: Right;   KNEE ARTHROSCOPY Right    NEUROMAL REMOVAL Right    Past Surgical History:  Procedure Laterality Date   A-FLUTTER ABLATION N/A 04/08/2018   Procedure: A-FLUTTER ABLATION;  Surgeon: Marinus Maw, MD;  Location: [facility] INVASIVE CV LAB;  Service: Cardiovascular;  Laterality: N/A;   ABDOMINAL HYSTERECTOMY     age 53 d/t endometriosis   AMPUTATION TOE Right 12/29/2022   Procedure: AMPUTATION TOE;  Surgeon: Edwin Cap, DPM;  Location: [facility] OR;  Service: Podiatry;  Laterality: Right;   KNEE ARTHROSCOPY Right    NEUROMAL REMOVAL Right    Past Medical History:  Diagnosis Date    ADD (attention deficit disorder)    COPD (chronic obstructive pulmonary disease) (HCC)    Hypertension    Hypothyroidism    There were no vitals taken for this visit.  Opioid Risk Score:   Fall Risk Score:  `1  Depression screen PHQ 2/9     09/25/2023    1:55 PM  Depression screen PHQ 2/9  Decreased Interest 1  Down, Depressed, Hopeless 2  PHQ - 2 Score 3  Altered sleeping 3  Tired, decreased energy 3  Change in appetite 3  Feeling bad or failure about yourself  3  Trouble concentrating 0  Moving slowly or fidgety/restless 0  Suicidal thoughts 1  PHQ-9 Score 16  Difficult doing work/chores Somewhat difficult    Review of Systems     Objective:   Physical Exam        Assessment & Plan:

## 2024-04-29 ENCOUNTER — Other Ambulatory Visit (HOSPITAL_COMMUNITY): Payer: Self-pay | Admitting: Internal Medicine

## 2024-04-29 ENCOUNTER — Ambulatory Visit (HOSPITAL_COMMUNITY)
Admission: RE | Admit: 2024-04-29 | Discharge: 2024-04-29 | Disposition: A | Discharge status: Home / Self Care | Payer: Medicare (Managed Care) | Source: Ambulatory Visit | Attending: Internal Medicine | Admitting: Internal Medicine

## 2024-04-29 DIAGNOSIS — M25512 Pain in left shoulder: Secondary | ICD-10-CM | POA: Diagnosis present

## 2024-07-15 ENCOUNTER — Encounter: Payer: Self-pay | Admitting: Nurse Practitioner

## 2024-07-23 ENCOUNTER — Encounter: Payer: Self-pay | Admitting: Nurse Practitioner

## 2024-08-02 ENCOUNTER — Ambulatory Visit (INDEPENDENT_AMBULATORY_CARE_PROVIDER_SITE_OTHER): Payer: Medicare (Managed Care) | Admitting: Podiatry

## 2024-08-02 VITALS — Ht 66.0 in | Wt 241.0 lb

## 2024-08-02 DIAGNOSIS — B351 Tinea unguium: Secondary | ICD-10-CM

## 2024-08-02 DIAGNOSIS — L02611 Cutaneous abscess of right foot: Secondary | ICD-10-CM | POA: Diagnosis not present

## 2024-08-02 DIAGNOSIS — M79675 Pain in left toe(s): Secondary | ICD-10-CM | POA: Diagnosis not present

## 2024-08-02 DIAGNOSIS — L03031 Cellulitis of right toe: Secondary | ICD-10-CM

## 2024-08-02 DIAGNOSIS — M79674 Pain in right toe(s): Secondary | ICD-10-CM

## 2024-08-02 MED ORDER — CEPHALEXIN 500 MG PO CAPS: 500.0000 mg | ORAL_CAPSULE | Freq: Three times a day (TID) | ORAL | 0 refills | Status: AC

## 2024-08-02 NOTE — Progress Notes (Signed)
  Subjective:  Patient ID: Autumn Parrish, female    DOB: 10/05/1951,  MRN: [id number]  Chief Complaint  Patient presents with   Ingrown Toenail    Rm 21 Patient is here for ingrown toenail of the right hallux. Right hallux toe is discolored wit some dry bleed on the medial side of the toe.    73 y.o. female presents with the above complaint. History confirmed with patient.  She presents today in regular shoe gear notes cramping in the toes.  Not wearing her brace.  Had redness and swelling and drainage coming from the right great toe  Objective:  Physical Exam: warm, good capillary refill, normal DP and PT pulses, normal sensory exam, and multiple calluses on the right foot with verrucous protrusions, skin very hyperkeratotic and dry, pes planovalgus deformity on the left ankle and lower extremity with collapse of the medial weightbearing arch and pain on palpation.  Toenails thickened elongated and mycotic with subungual debris.  There is erythema and redness around the right hallux nail.  It is not loose.     Assessment:   1. Cellulitis and abscess of toe of right foot   2. Pain due to onychomycosis of toenails of both feet       Plan:  Patient was evaluated and treated and all questions answered.  Discussed the etiology and treatment options for the condition in detail with the patient. Recommended debridement of the nails today. Sharp and mechanical debridement performed of all painful and mycotic nails today. Nails debrided in length and thickness using a nail nipper to level of comfort. Follow up as needed for painful nails.  Her right hallux nail has a resolving infection.  I placed her on cephalexin  and Rx was provided to her in person at her request to be filled by [facility].  Nail plate was well attached and did not require removal.  Return 2 weeks to reevaluate.  Photographs taken.   Cramping likely secondary to her severe flatfoot deformity on the left.  She is not a good  surgical candidate.  Recommend she continue using the Richie brace.  Return in about 2 weeks (around 08/16/2024) for right hallux nail check .

## 2024-08-19 ENCOUNTER — Ambulatory Visit (INDEPENDENT_AMBULATORY_CARE_PROVIDER_SITE_OTHER): Payer: Medicare (Managed Care) | Admitting: Podiatry

## 2024-08-19 ENCOUNTER — Ambulatory Visit (INDEPENDENT_AMBULATORY_CARE_PROVIDER_SITE_OTHER): Payer: Medicare (Managed Care)

## 2024-08-19 VITALS — Ht 66.0 in | Wt 241.0 lb

## 2024-08-19 DIAGNOSIS — M84375A Stress fracture, left foot, initial encounter for fracture: Secondary | ICD-10-CM

## 2024-08-19 DIAGNOSIS — M19072 Primary osteoarthritis, left ankle and foot: Secondary | ICD-10-CM | POA: Diagnosis not present

## 2024-08-19 DIAGNOSIS — M76822 Posterior tibial tendinitis, left leg: Secondary | ICD-10-CM

## 2024-08-19 NOTE — Progress Notes (Signed)
  Subjective:  Patient ID: Autumn Parrish, female    DOB: 1950/12/24,  MRN: [id number]  Chief Complaint  Patient presents with   Cellulitis    RM 1Cellulitis and abscess of toe of right foot.    73 y.o. female presents with the above complaint. History confirmed with patient.  Her right foot is doing better after completing the antibiotics.  She is still dealing with significant pain on the left foot  Objective:  Physical Exam: warm, good capillary refill, normal DP and PT pulses, normal sensory exam, and multiple calluses on the right foot with verrucous protrusions, skin very hyperkeratotic and dry, pes planovalgus deformity on the left ankle and lower extremity with collapse of the medial weightbearing arch and pain on palpation.  Toenails thickened elongated and mycotic with subungual debris.  Erythema right hallux has resolved   Foot and ankle radiographs the left foot taken today show progressive collapse of the medial longitudinal arch no valgus deformity of the ankle, calcaneus is in valgus positioning  Assessment:   1. Posterior tibial tendon dysfunction (PTTD) of left lower extremity   2. Stress fracture of left foot, initial encounter   3. Arthritis of left foot       Plan:  Patient was evaluated and treated and all questions answered.  Hallux infection has resolved she will let me know if she needs further antibiotic therapy.  We reviewed and discussed her new x-rays taken today and she is having worsening pain.  She is using the Richie brace she states but the pain continues to progress.  She has progressive collapse since her last weightbearing x-rays.  I recommended a new CT scan to develop further treatment options.  We will revisit if the Richie brace has been successful may consider double upright brace.  Physical therapy may be an option as well.  Follow-up with me after the CT to review her options.  Return for after CT to review.

## 2024-08-24 ENCOUNTER — Telehealth: Payer: Self-pay | Admitting: Podiatry

## 2024-08-24 NOTE — Telephone Encounter (Signed)
 Patient was seen last Thursday stress fracture of left foot   Please advise on weight-bearing restrictions. Should the patient obtain the Richie brace or the double upright brace now, or wait until after the CT scan results?

## 2024-08-26 ENCOUNTER — Other Ambulatory Visit: Payer: Medicare (Managed Care)

## 2024-08-27 ENCOUNTER — Ambulatory Visit
Admission: RE | Admit: 2024-08-27 | Discharge: 2024-08-27 | Disposition: A | Discharge status: Home / Self Care | Payer: Medicare (Managed Care) | Source: Ambulatory Visit | Attending: Podiatry | Admitting: Podiatry

## 2024-08-27 DIAGNOSIS — M76822 Posterior tibial tendinitis, left leg: Secondary | ICD-10-CM

## 2024-08-27 DIAGNOSIS — M19072 Primary osteoarthritis, left ankle and foot: Secondary | ICD-10-CM

## 2024-08-27 DIAGNOSIS — M84375A Stress fracture, left foot, initial encounter for fracture: Secondary | ICD-10-CM

## 2024-09-07 ENCOUNTER — Other Ambulatory Visit: Payer: Medicare (Managed Care)

## 2024-09-13 ENCOUNTER — Ambulatory Visit: Payer: Self-pay | Admitting: Podiatry

## 2024-09-28 ENCOUNTER — Telehealth: Payer: Self-pay | Admitting: Podiatry

## 2024-09-28 NOTE — Telephone Encounter (Signed)
 Called to get patient scheduled for follow up appointment. She states she received brace and cannot wear it because it is too uncomfortable. She asked me to confirm if she still needs to make an appointment to see you.

## 2024-09-29 NOTE — Telephone Encounter (Signed)
 Patient is scheduled and understands to bring brace with her to appointment.

## 2024-11-17 ENCOUNTER — Telehealth: Payer: Self-pay | Admitting: Physical Medicine & Rehabilitation

## 2024-11-17 NOTE — Telephone Encounter (Signed)
 Jasmine from [facility] calling to see about consideration for scheduling a transluminal ESI for Ms. Fuhriman. She would like to know if it should be a follow up first as the patient has not been seen in about a year (10/24/2023). She can be reached at [phone] for scheduling.

## 2024-11-18 ENCOUNTER — Ambulatory Visit: Payer: Medicare (Managed Care) | Admitting: Podiatry

## 2024-11-24 ENCOUNTER — Other Ambulatory Visit (HOSPITAL_COMMUNITY): Payer: Self-pay | Admitting: Nurse Practitioner

## 2024-11-24 ENCOUNTER — Ambulatory Visit (HOSPITAL_COMMUNITY)
Admission: RE | Admit: 2024-11-24 | Discharge: 2024-11-24 | Disposition: A | Discharge status: Home / Self Care | Payer: Medicare (Managed Care) | Source: Ambulatory Visit | Attending: Nurse Practitioner | Admitting: Nurse Practitioner

## 2024-11-24 DIAGNOSIS — M25522 Pain in left elbow: Secondary | ICD-10-CM | POA: Diagnosis present

## 2024-12-23 ENCOUNTER — Encounter: Payer: Self-pay | Admitting: Podiatry

## 2024-12-23 ENCOUNTER — Ambulatory Visit: Payer: Medicare (Managed Care) | Admitting: Podiatry

## 2024-12-23 VITALS — Ht 66.0 in | Wt 241.0 lb

## 2024-12-23 DIAGNOSIS — M79675 Pain in left toe(s): Secondary | ICD-10-CM | POA: Diagnosis not present

## 2024-12-23 DIAGNOSIS — B351 Tinea unguium: Secondary | ICD-10-CM | POA: Diagnosis not present

## 2024-12-23 DIAGNOSIS — M79674 Pain in right toe(s): Secondary | ICD-10-CM | POA: Diagnosis not present

## 2024-12-23 DIAGNOSIS — M76822 Posterior tibial tendinitis, left leg: Secondary | ICD-10-CM

## 2024-12-24 ENCOUNTER — Ambulatory Visit: Payer: Medicare (Managed Care) | Admitting: Sports Medicine

## 2024-12-24 NOTE — Progress Notes (Signed)
"  °  Subjective:  Patient ID: Autumn Parrish, female    DOB: [birth year],  MRN: [id number]  Chief Complaint  Patient presents with   Foot Pain    Pt is here to f/u on left foot due to pain, she states that she received a brace on her last visit and is unable to wear it because it does not fit correctly, states the pain is about the same. She wants to have her toenails trim today as well, no other complaints.    74 y.o. female presents with the above complaint. History confirmed with patient.  Nails thick elongated and painful.  She is still dealing with significant pain on the left foot  Objective:  Physical Exam: warm, good capillary refill, normal DP and PT pulses, normal sensory exam, and multiple calluses on the right foot with verrucous protrusions, skin very hyperkeratotic and dry, pes planovalgus deformity on the left ankle and lower extremity with collapse of the medial weightbearing arch and pain on palpation.  Toenails thickened elongated and mycotic with subungual debris.  Erythema right hallux has resolved   Foot and ankle radiographs the left foot taken today show progressive collapse of the medial longitudinal arch no valgus deformity of the ankle, calcaneus is in valgus positioning     IMPRESSION: 1. No acute osseous injury of the left foot. Planovalgus alignment. 2. Healed fractures of the third and fourth metatarsal diaphysis with heterotopic ossification along the lateral margin of the third metatarsal diaphysis and medial margin of the fourth metatarsal diaphysis. 3. Severe osteoarthritis of the calcaneocuboid joint. 4. Mild osteoarthritis of the subtalar joints. 5. Moderate osteoarthritis of the talonavicular joint. 6. Mild-moderate osteoarthritis of the navicular-middle cuneiform.     Electronically Signed   By: Julaine Blanch M.D.   On: 08/30/2024 16:20  Assessment:   1. Pain due to onychomycosis of toenails of both feet   2. Posterior tibial tendon dysfunction  (PTTD) of left lower extremity        Plan:  Patient was evaluated and treated and all questions answered.  Discussed the etiology and treatment options for the condition in detail with the patient. Recommended debridement of the nails today. Sharp and mechanical debridement performed of all painful and mycotic nails today. Nails debrided in length and thickness using a nail nipper to level of comfort. Follow up as needed for painful nails.   I reviewed her CT scan results from September 2025, discussed the worsening deformity and arthritis in her hindfoot, discussed the previous brace she had with her that has not been successful may transition to a double upright brace which may be easier for her to use.  Would not be a surgical candidate for reconstruction due to significant recovery and need for nonweightbearing for 2 to 3 months, I do not think this would be feasible for her with her personal and living situation.  New Rx for double upright brace written and given to her to take to [facility].  Follow-up with me as needed.  No follow-ups on file.   "

## 2025-01-10 ENCOUNTER — Ambulatory Visit: Payer: Medicare (Managed Care) | Admitting: Sports Medicine

## 2025-02-01 ENCOUNTER — Ambulatory Visit: Payer: Medicare (Managed Care) | Admitting: Sports Medicine
